# Patient Record
Sex: Female | Born: 1960 | Race: Black or African American | Hispanic: No | Marital: Married | Smoking: Current every day smoker
Health system: Southern US, Community
[De-identification: ages and names within clinical notes are randomized; demographics above are authoritative.]

## PROBLEM LIST (undated history)

## (undated) DIAGNOSIS — C50919 Malignant neoplasm of unspecified site of unspecified female breast: Secondary | ICD-10-CM

## (undated) DIAGNOSIS — I272 Pulmonary hypertension, unspecified: Secondary | ICD-10-CM

## (undated) DIAGNOSIS — K219 Gastro-esophageal reflux disease without esophagitis: Secondary | ICD-10-CM

## (undated) DIAGNOSIS — I10 Essential (primary) hypertension: Secondary | ICD-10-CM

## (undated) DIAGNOSIS — Z923 Personal history of irradiation: Secondary | ICD-10-CM

## (undated) DIAGNOSIS — I509 Heart failure, unspecified: Secondary | ICD-10-CM

## (undated) DIAGNOSIS — R0600 Dyspnea, unspecified: Secondary | ICD-10-CM

## (undated) DIAGNOSIS — I50811 Acute right heart failure: Secondary | ICD-10-CM

## (undated) DIAGNOSIS — E8770 Fluid overload, unspecified: Secondary | ICD-10-CM

## (undated) DIAGNOSIS — M7989 Other specified soft tissue disorders: Secondary | ICD-10-CM

## (undated) DIAGNOSIS — R1900 Intra-abdominal and pelvic swelling, mass and lump, unspecified site: Secondary | ICD-10-CM

## (undated) DIAGNOSIS — Z720 Tobacco use: Secondary | ICD-10-CM

## (undated) DIAGNOSIS — J449 Chronic obstructive pulmonary disease, unspecified: Secondary | ICD-10-CM

## (undated) DIAGNOSIS — I50810 Right heart failure, unspecified: Secondary | ICD-10-CM

## (undated) DIAGNOSIS — R600 Localized edema: Secondary | ICD-10-CM

## (undated) DIAGNOSIS — I5033 Acute on chronic diastolic (congestive) heart failure: Secondary | ICD-10-CM

## (undated) HISTORY — PX: BREAST LUMPECTOMY: SHX2

## (undated) HISTORY — PX: BREAST BIOPSY: SHX20

## (undated) HISTORY — DX: Dyspnea, unspecified: R06.00

## (undated) HISTORY — DX: Intra-abdominal and pelvic swelling, mass and lump, unspecified site: R19.00

## (undated) HISTORY — DX: Acute on chronic diastolic (congestive) heart failure: I50.33

## (undated) HISTORY — DX: Right heart failure, unspecified: I50.810

## (undated) HISTORY — DX: Fluid overload, unspecified: E87.70

## (undated) HISTORY — DX: Pulmonary hypertension, unspecified: I27.20

## (undated) HISTORY — DX: Tobacco use: Z72.0

## (undated) HISTORY — PX: TUBAL LIGATION: SHX77

## (undated) HISTORY — DX: Chronic obstructive pulmonary disease, unspecified: J44.9

## (undated) HISTORY — DX: Acute right heart failure: I50.811

## (undated) HISTORY — DX: Localized edema: R60.0

## (undated) HISTORY — DX: Gastro-esophageal reflux disease without esophagitis: K21.9

## (undated) HISTORY — DX: Heart failure, unspecified: I50.9

---

## 1998-10-20 ENCOUNTER — Encounter: Admission: RE | Admit: 1998-10-20 | Discharge: 1998-10-20 | Payer: Self-pay | Admitting: Family Medicine

## 1998-10-31 ENCOUNTER — Encounter: Admission: RE | Admit: 1998-10-31 | Discharge: 1998-10-31 | Payer: Self-pay | Admitting: Family Medicine

## 2000-09-23 ENCOUNTER — Encounter: Admission: RE | Admit: 2000-09-23 | Discharge: 2000-09-23 | Payer: Self-pay | Admitting: Family Medicine

## 2000-09-23 ENCOUNTER — Encounter: Payer: Self-pay | Admitting: Family Medicine

## 2000-09-23 LAB — CONVERTED CEMR LAB
HCT: 26 %
Hemoglobin: 7.6 g/dL
MCV: 60.8 fL
Pap Smear: NORMAL
WBC: 6.1 10*3/uL

## 2000-09-27 ENCOUNTER — Encounter: Admission: RE | Admit: 2000-09-27 | Discharge: 2000-09-27 | Payer: Self-pay | Admitting: Family Medicine

## 2000-10-07 ENCOUNTER — Encounter: Payer: Self-pay | Admitting: Sports Medicine

## 2000-10-07 ENCOUNTER — Encounter: Admission: RE | Admit: 2000-10-07 | Discharge: 2000-10-07 | Payer: Self-pay | Admitting: Sports Medicine

## 2005-01-17 ENCOUNTER — Encounter (INDEPENDENT_AMBULATORY_CARE_PROVIDER_SITE_OTHER): Payer: Self-pay | Admitting: *Deleted

## 2005-01-17 LAB — CONVERTED CEMR LAB

## 2005-01-18 ENCOUNTER — Ambulatory Visit: Payer: Self-pay | Admitting: Family Medicine

## 2005-01-18 ENCOUNTER — Encounter: Payer: Self-pay | Admitting: Family Medicine

## 2005-01-18 LAB — CONVERTED CEMR LAB
ALT: <8 units/L
AST: 13 units/L
Basophils Absolute: 0.2 10*3/uL
Basophils Relative: 3 %
CO2: 23 meq/L
Chloride: 106 meq/L
Creatinine, Ser: 0.9 mg/dL
Hemoglobin: 4.7 g/dL
Lymphocytes Relative: 32 %
MCHC: 24.4 g/dL
Monocytes Absolute: 0.5 10*3/uL
Neutro Abs: 3.5 10*3/uL
Platelets: 282 10*3/uL
RDW: 19.5 %
Sodium: 140 meq/L
Total Bilirubin: 0.9 mg/dL
Total Protein: 7 g/dL

## 2005-01-22 ENCOUNTER — Encounter: Admission: RE | Admit: 2005-01-22 | Discharge: 2005-01-22 | Payer: Self-pay | Admitting: Family Medicine

## 2005-01-26 ENCOUNTER — Ambulatory Visit: Payer: Self-pay | Admitting: Family Medicine

## 2005-01-26 ENCOUNTER — Encounter: Payer: Self-pay | Admitting: Family Medicine

## 2005-01-26 LAB — CONVERTED CEMR LAB
Ferritin: 2 ng/mL
HCT: 22.1 %
MCHC: 24.4 g/dL
MCV: 63.3 fL
Platelets: 234 10*3/uL
RDW: 23.6 %

## 2005-02-02 ENCOUNTER — Ambulatory Visit: Payer: Self-pay | Admitting: Family Medicine

## 2005-02-05 ENCOUNTER — Ambulatory Visit: Payer: Self-pay | Admitting: Family Medicine

## 2005-02-05 LAB — CONVERTED CEMR LAB
Cholesterol: 144 mg/dL
HDL: 52 mg/dL
Hemoglobin: 6.6 g/dL
MCHC: 23.4 g/dL
Platelets: 500 10*3/uL
RDW: 28.8 %
Triglycerides: 90 mg/dL

## 2006-05-16 DIAGNOSIS — Z720 Tobacco use: Secondary | ICD-10-CM

## 2006-05-16 DIAGNOSIS — K219 Gastro-esophageal reflux disease without esophagitis: Secondary | ICD-10-CM | POA: Insufficient documentation

## 2006-05-16 HISTORY — DX: Tobacco use: Z72.0

## 2006-05-16 HISTORY — DX: Gastro-esophageal reflux disease without esophagitis: K21.9

## 2006-05-17 ENCOUNTER — Encounter (INDEPENDENT_AMBULATORY_CARE_PROVIDER_SITE_OTHER): Payer: Self-pay | Admitting: *Deleted

## 2008-07-05 ENCOUNTER — Encounter: Payer: Self-pay | Admitting: Family Medicine

## 2008-07-05 ENCOUNTER — Ambulatory Visit: Payer: Self-pay | Admitting: Family Medicine

## 2008-07-05 DIAGNOSIS — R1900 Intra-abdominal and pelvic swelling, mass and lump, unspecified site: Secondary | ICD-10-CM

## 2008-07-05 HISTORY — DX: Intra-abdominal and pelvic swelling, mass and lump, unspecified site: R19.00

## 2008-07-05 LAB — CONVERTED CEMR LAB
ALT: <8 units/L
AST: 23 units/L
Albumin: 4.3 g/dL
Alkaline Phosphatase: 53 units/L
Chlamydia, DNA Probe: NEGATIVE
GC Probe Amp, Genital: NEGATIVE
Glucose, Bld: 86 mg/dL
HCT: 42.9 % (ref 36.0–46.0)
Hemoglobin: 13.9 g/dL (ref 12.0–15.0)
Potassium: 4 meq/L
RBC: 5.01 M/uL (ref 3.87–5.11)
Sodium: 140 meq/L
Total Bilirubin: 0.4 mg/dL
Total Protein: 7 g/dL
WBC: 6.7 10*3/uL (ref 4.0–10.5)

## 2008-07-06 ENCOUNTER — Encounter: Admission: RE | Admit: 2008-07-06 | Discharge: 2008-07-06 | Payer: Self-pay | Admitting: Family Medicine

## 2008-07-06 ENCOUNTER — Encounter: Payer: Self-pay | Admitting: Family Medicine

## 2008-07-07 ENCOUNTER — Encounter: Payer: Self-pay | Admitting: Family Medicine

## 2008-08-13 ENCOUNTER — Encounter: Payer: Self-pay | Admitting: Family Medicine

## 2008-08-26 ENCOUNTER — Encounter: Payer: Self-pay | Admitting: Family Medicine

## 2009-08-12 ENCOUNTER — Emergency Department (HOSPITAL_COMMUNITY): Admission: EM | Admit: 2009-08-12 | Discharge: 2009-08-12 | Payer: Self-pay | Admitting: Emergency Medicine

## 2010-04-24 ENCOUNTER — Encounter: Payer: Self-pay | Admitting: *Deleted

## 2014-01-12 ENCOUNTER — Emergency Department (HOSPITAL_COMMUNITY): Payer: Self-pay

## 2014-01-12 ENCOUNTER — Emergency Department (HOSPITAL_COMMUNITY)
Admission: EM | Admit: 2014-01-12 | Discharge: 2014-01-13 | Disposition: A | Discharge status: Home / Self Care | Payer: Self-pay | Attending: Emergency Medicine | Admitting: Emergency Medicine

## 2014-01-12 ENCOUNTER — Encounter (HOSPITAL_COMMUNITY): Payer: Self-pay | Admitting: Emergency Medicine

## 2014-01-12 DIAGNOSIS — R05 Cough: Secondary | ICD-10-CM | POA: Insufficient documentation

## 2014-01-12 DIAGNOSIS — Z79899 Other long term (current) drug therapy: Secondary | ICD-10-CM | POA: Insufficient documentation

## 2014-01-12 DIAGNOSIS — Z720 Tobacco use: Secondary | ICD-10-CM | POA: Insufficient documentation

## 2014-01-12 DIAGNOSIS — K047 Periapical abscess without sinus: Secondary | ICD-10-CM | POA: Insufficient documentation

## 2014-01-12 DIAGNOSIS — R0602 Shortness of breath: Secondary | ICD-10-CM

## 2014-01-12 DIAGNOSIS — R0600 Dyspnea, unspecified: Secondary | ICD-10-CM | POA: Insufficient documentation

## 2014-01-12 DIAGNOSIS — K029 Dental caries, unspecified: Secondary | ICD-10-CM | POA: Insufficient documentation

## 2014-01-12 DIAGNOSIS — R0902 Hypoxemia: Secondary | ICD-10-CM | POA: Insufficient documentation

## 2014-01-12 DIAGNOSIS — R0981 Nasal congestion: Secondary | ICD-10-CM | POA: Insufficient documentation

## 2014-01-12 LAB — BASIC METABOLIC PANEL
ANION GAP: 12 (ref 5–15)
BUN: 7 mg/dL (ref 6–23)
CALCIUM: 9.5 mg/dL (ref 8.4–10.5)
CO2: 33 meq/L — AB (ref 19–32)
Chloride: 94 mEq/L — ABNORMAL LOW (ref 96–112)
Creatinine, Ser: 0.67 mg/dL (ref 0.50–1.10)
Glucose, Bld: 106 mg/dL — ABNORMAL HIGH (ref 70–99)
Potassium: 3.4 mEq/L — ABNORMAL LOW (ref 3.7–5.3)
SODIUM: 139 meq/L (ref 137–147)

## 2014-01-12 LAB — CBC
HCT: 45.8 % (ref 36.0–46.0)
HEMOGLOBIN: 14.5 g/dL (ref 12.0–15.0)
MCH: 27.8 pg (ref 26.0–34.0)
MCHC: 31.7 g/dL (ref 30.0–36.0)
MCV: 87.9 fL (ref 78.0–100.0)
Platelets: 258 10*3/uL (ref 150–400)
RBC: 5.21 MIL/uL — AB (ref 3.87–5.11)
RDW: 13.2 % (ref 11.5–15.5)
WBC: 12.1 10*3/uL — AB (ref 4.0–10.5)

## 2014-01-12 LAB — I-STAT TROPONIN, ED: TROPONIN I, POC: 0 ng/mL (ref 0.00–0.08)

## 2014-01-12 LAB — TROPONIN I

## 2014-01-12 MED ORDER — PREDNISONE 20 MG PO TABS
60.0000 mg | ORAL_TABLET | Freq: Once | ORAL | Status: AC
  Administered 2014-01-13: 60 mg via ORAL
  Filled 2014-01-12: qty 3

## 2014-01-12 MED ORDER — AMOXICILLIN-POT CLAVULANATE 875-125 MG PO TABS: 1.0000 | ORAL_TABLET | Freq: Two times a day (BID) | ORAL | Status: DC

## 2014-01-12 MED ORDER — ALBUTEROL SULFATE HFA 108 (90 BASE) MCG/ACT IN AERS
2.0000 | INHALATION_SPRAY | Freq: Four times a day (QID) | RESPIRATORY_TRACT | Status: DC
  Administered 2014-01-13: 2 via RESPIRATORY_TRACT
  Filled 2014-01-12: qty 6.7

## 2014-01-12 MED ORDER — AMOXICILLIN-POT CLAVULANATE 875-125 MG PO TABS
1.0000 | ORAL_TABLET | Freq: Once | ORAL | Status: AC
  Administered 2014-01-12: 1 via ORAL
  Filled 2014-01-12: qty 1

## 2014-01-12 MED ORDER — PREDNISONE 20 MG PO TABS: 60.0000 mg | ORAL_TABLET | Freq: Once | ORAL | Status: AC

## 2014-01-12 MED ORDER — IOHEXOL 350 MG/ML SOLN
100.0000 mL | Freq: Once | INTRAVENOUS | Status: AC | PRN
  Administered 2014-01-12: 100 mL via INTRAVENOUS

## 2014-01-12 NOTE — ED Notes (Signed)
Bed: WA06 Expected date:  Expected time:  Means of arrival:  Comments: No monitor

## 2014-01-12 NOTE — ED Notes (Addendum)
Pt reports chest pain x 1 hour, sob and cough for several weeks. Pt reports upper right dental pain since Thursday with facial pain/swelling

## 2014-01-12 NOTE — ED Provider Notes (Signed)
CSN: [id number]     Arrival date & time 01/12/14  1716 History   First MD Initiated Contact with Patient 01/12/14 2037     Chief Complaint  Patient presents with  . Chest Pain      HPI  Patient presents with chest pain and facial swelling. Patient notes that over the past month she has had cough, dyspnea, without fever, chills, nausea, vomiting. Over the past 2 days patient has also developed right facial swelling, with no new dysphagia, dysphasia, visual loss or headache. Today, the report prior to ED arrival the patient's substernal chest discomfort. This pain has eased prior to my evaluation, spontaneously. The pain was sternal, nonradiating, sore. It is not pleuritic or exertional. Patient has not seen her physician in at least 5 years. Patient smokes.   Patient received smoking cessation counseling.     History reviewed. No pertinent past medical history. History reviewed. No pertinent past surgical history. No family history on file. History  Substance Use Topics  . Smoking status: Current Every Day Smoker  . Smokeless tobacco: Not on file  . Alcohol Use: Yes   OB History   Grav Para Term Preterm Abortions TAB SAB Ect Mult Living                 Review of Systems  Constitutional:       Per HPI, otherwise negative  HENT:       Per HPI, otherwise negative  Respiratory:       Per HPI, otherwise negative  Cardiovascular:       Per HPI, otherwise negative  Gastrointestinal: Negative for vomiting.  Endocrine:       Negative aside from HPI  Genitourinary:       Neg aside from HPI   Musculoskeletal:       Per HPI, otherwise negative  Skin: Negative.   Neurological: Negative for syncope.      Allergies  Review of patient's allergies indicates not on file.  Home Medications   Prior to Admission medications   Medication Sig Start Date End Date Taking? Authorizing Provider  esomeprazole (NEXIUM) 20 MG capsule Take 20 mg by mouth daily. one hour before  largest meal of day     Historical Provider, MD   BP 168/96  Pulse 84  Temp(Src) 100.2 F (37.9 C) (Oral)  Resp 20  Ht 5' 6" (1.676 m)  Wt 189 lb (85.73 kg)  BMI 30.52 kg/m2  SpO2 98% Physical Exam  Nursing note and vitals reviewed. Constitutional: She is oriented to person, place, and time. She appears well-developed and well-nourished. No distress.  HENT:  Head: Normocephalic and atraumatic.  Right facial swelling, no changes about the orbit. The right upper posterior teeth are all decayed, with surrounding erythema, no drainage, no bleeding.  Eyes: Conjunctivae and EOM are normal.  Cardiovascular: Normal rate and regular rhythm.   Pulmonary/Chest: Effort normal and breath sounds normal. No stridor. No respiratory distress.  Abdominal: She exhibits no distension.  Musculoskeletal: She exhibits no edema.  Neurological: She is alert and oriented to person, place, and time. No cranial nerve deficit.  Skin: Skin is warm and dry.  Psychiatric: She has a normal mood and affect.    ED Course  Procedures (including critical care time) Labs Review Labs Reviewed  BASIC METABOLIC PANEL - Abnormal; Notable for the following:    Potassium 3.4 (*)    Chloride 94 (*)    CO2 33 (*)    Glucose, Bld 106 (*)  All other components within normal limits  CBC - Abnormal; Notable for the following:    WBC 12.1 (*)    RBC 5.21 (*)    All other components within normal limits  I-STAT TROPOININ, ED    Imaging Review Dg Chest 2 View  01/12/2014   CLINICAL DATA:  Cough and shortness breath for 2 weeks. Mid chest pain. History of smoking.  EXAM: CHEST  2 VIEW  COMPARISON:  None.  FINDINGS: Two views of the chest demonstrate prominent right hilar structures. There may be linear atelectasis in the right mid lung. Azygos shadow is slightly prominent. There is no focal airspace disease or pleural effusions. Trachea is midline. Heart size is within normal limits.  IMPRESSION: Prominent hilar and  central vascular structures, particularly on the right side. Findings could represent vascular congestion but difficult to exclude lymphadenopathy. Consider further evaluation with a post contrast chest CT.  Linear densities in the right mid chest suggest atelectasis. No focal airspace disease.   Electronically Signed   By: Markus Daft M.D.   On: 01/12/2014 18:24     EKG Interpretation   Date/Time:  Tuesday January 12 2014 17:32:04 EDT Ventricular Rate:  92 PR Interval:  141 QRS Duration: 86 QT Interval:  473 QTC Calculation: 585 R Axis:   80 Text Interpretation:  Sinus rhythm Nonspecific T abnormalities, diffuse  leads Prolonged QT interval agree. No STEMI. ABNORMAL Confirmed by  Johnney Killian, MD, Jeannie Done [phone]) on 01/12/2014 6:05:02 PM     On re-exam the patient is in no distress MDM   Final diagnoses:  Hypoxia  dental infection   Patient presents with one month of cough, congestion, mild chest pain. Patient also has facial swelling, which seems unrelated. Patient has no evidence for respiratory compromise, sepsis, SIRS. Patient's respiratory computer likely due to bronchitis, given that she is a smoker. Without evidence for pneumonia, or concerning findings, she is discharged in stable condition with bronchodilator, steroids       Carmin Muskrat, MD 01/12/14 2319

## 2014-01-12 NOTE — Discharge Instructions (Signed)
As discussed, your chest pain and cough is likely due to bronchitis.  The single best thing you can do for this condition is to stop smoking. Please take all medication as directed, and be sure to follow-up with regular care.  Please use the provided inhaler every four hours for the next two days.  In addition, your facial swelling is due to a dental infection.  It is important that she take her antibiotics, and follow-up with a dentist for definitive care.  Return here for concern changes in your condition.

## 2015-08-08 ENCOUNTER — Emergency Department (HOSPITAL_COMMUNITY): Payer: Self-pay

## 2015-08-08 ENCOUNTER — Encounter (HOSPITAL_COMMUNITY): Payer: Self-pay | Admitting: Emergency Medicine

## 2015-08-08 ENCOUNTER — Inpatient Hospital Stay (HOSPITAL_COMMUNITY)
Admission: EM | Admit: 2015-08-08 | Discharge: 2015-08-13 | DRG: 292 | Disposition: A | Discharge status: Home / Self Care | Payer: Self-pay | Attending: Family Medicine | Admitting: Family Medicine

## 2015-08-08 DIAGNOSIS — R600 Localized edema: Secondary | ICD-10-CM | POA: Insufficient documentation

## 2015-08-08 DIAGNOSIS — G4733 Obstructive sleep apnea (adult) (pediatric): Secondary | ICD-10-CM | POA: Diagnosis present

## 2015-08-08 DIAGNOSIS — I509 Heart failure, unspecified: Secondary | ICD-10-CM

## 2015-08-08 DIAGNOSIS — I5033 Acute on chronic diastolic (congestive) heart failure: Secondary | ICD-10-CM | POA: Diagnosis present

## 2015-08-08 DIAGNOSIS — F17200 Nicotine dependence, unspecified, uncomplicated: Secondary | ICD-10-CM | POA: Diagnosis present

## 2015-08-08 DIAGNOSIS — E874 Mixed disorder of acid-base balance: Secondary | ICD-10-CM | POA: Diagnosis present

## 2015-08-08 DIAGNOSIS — Z23 Encounter for immunization: Secondary | ICD-10-CM

## 2015-08-08 DIAGNOSIS — I272 Other secondary pulmonary hypertension: Secondary | ICD-10-CM | POA: Diagnosis present

## 2015-08-08 DIAGNOSIS — I5031 Acute diastolic (congestive) heart failure: Secondary | ICD-10-CM | POA: Diagnosis present

## 2015-08-08 DIAGNOSIS — E8770 Fluid overload, unspecified: Secondary | ICD-10-CM | POA: Insufficient documentation

## 2015-08-08 DIAGNOSIS — E785 Hyperlipidemia, unspecified: Secondary | ICD-10-CM | POA: Diagnosis present

## 2015-08-08 DIAGNOSIS — D509 Iron deficiency anemia, unspecified: Secondary | ICD-10-CM | POA: Diagnosis present

## 2015-08-08 DIAGNOSIS — J449 Chronic obstructive pulmonary disease, unspecified: Secondary | ICD-10-CM | POA: Diagnosis present

## 2015-08-08 DIAGNOSIS — I50810 Right heart failure, unspecified: Secondary | ICD-10-CM

## 2015-08-08 DIAGNOSIS — R7303 Prediabetes: Secondary | ICD-10-CM | POA: Diagnosis present

## 2015-08-08 DIAGNOSIS — R0902 Hypoxemia: Secondary | ICD-10-CM | POA: Insufficient documentation

## 2015-08-08 DIAGNOSIS — I110 Hypertensive heart disease with heart failure: Principal | ICD-10-CM | POA: Diagnosis present

## 2015-08-08 DIAGNOSIS — I2781 Cor pulmonale (chronic): Secondary | ICD-10-CM | POA: Diagnosis present

## 2015-08-08 DIAGNOSIS — K219 Gastro-esophageal reflux disease without esophagitis: Secondary | ICD-10-CM | POA: Diagnosis present

## 2015-08-08 DIAGNOSIS — R0600 Dyspnea, unspecified: Secondary | ICD-10-CM | POA: Diagnosis present

## 2015-08-08 DIAGNOSIS — Z7982 Long term (current) use of aspirin: Secondary | ICD-10-CM

## 2015-08-08 DIAGNOSIS — I50811 Acute right heart failure: Secondary | ICD-10-CM

## 2015-08-08 DIAGNOSIS — E872 Acidosis: Secondary | ICD-10-CM | POA: Diagnosis present

## 2015-08-08 DIAGNOSIS — I10 Essential (primary) hypertension: Secondary | ICD-10-CM | POA: Insufficient documentation

## 2015-08-08 HISTORY — DX: Essential (primary) hypertension: I10

## 2015-08-08 HISTORY — DX: Gastro-esophageal reflux disease without esophagitis: K21.9

## 2015-08-08 HISTORY — DX: Other specified soft tissue disorders: M79.89

## 2015-08-08 LAB — CBC WITH DIFFERENTIAL/PLATELET
BASOS ABS: 0.1 10*3/uL (ref 0.0–0.1)
BASOS PCT: 1 %
Eosinophils Absolute: 0.1 10*3/uL (ref 0.0–0.7)
Eosinophils Relative: 1 %
HEMATOCRIT: 47.7 % — AB (ref 36.0–46.0)
Hemoglobin: 14.2 g/dL (ref 12.0–15.0)
Lymphocytes Relative: 34 %
Lymphs Abs: 1.8 10*3/uL (ref 0.7–4.0)
MCH: 25.7 pg — ABNORMAL LOW (ref 26.0–34.0)
MCHC: 29.8 g/dL — ABNORMAL LOW (ref 30.0–36.0)
MCV: 86.3 fL (ref 78.0–100.0)
MONO ABS: 0.5 10*3/uL (ref 0.1–1.0)
Monocytes Relative: 10 %
NEUTROS ABS: 2.9 10*3/uL (ref 1.7–7.7)
Neutrophils Relative %: 54 %
PLATELETS: 237 10*3/uL (ref 150–400)
RBC: 5.53 MIL/uL — AB (ref 3.87–5.11)
RDW: 13.9 % (ref 11.5–15.5)
WBC: 5.4 10*3/uL (ref 4.0–10.5)

## 2015-08-08 LAB — TROPONIN I
TROPONIN I: 0.03 ng/mL (ref <0.031)
Troponin I: 0.03 ng/mL (ref <0.031)

## 2015-08-08 LAB — LIPID PANEL
CHOL/HDL RATIO: 3.8 ratio
Cholesterol: 174 mg/dL (ref 0–200)
HDL: 46 mg/dL (ref >40)
LDL Cholesterol: 115 mg/dL — ABNORMAL HIGH (ref 0–99)
TRIGLYCERIDES: 64 mg/dL (ref <150)
VLDL: 13 mg/dL (ref 0–40)

## 2015-08-08 LAB — COMPREHENSIVE METABOLIC PANEL
ALBUMIN: 3.3 g/dL — AB (ref 3.5–5.0)
ALK PHOS: 68 U/L (ref 38–126)
ALT: 27 U/L (ref 14–54)
ANION GAP: 5 (ref 5–15)
AST: 30 U/L (ref 15–41)
BUN: 5 mg/dL — ABNORMAL LOW (ref 6–20)
CALCIUM: 9.2 mg/dL (ref 8.9–10.3)
CO2: 35 mmol/L — ABNORMAL HIGH (ref 22–32)
Chloride: 102 mmol/L (ref 101–111)
Creatinine, Ser: 0.79 mg/dL (ref 0.44–1.00)
GFR calc Af Amer: >60 mL/min (ref >60)
GLUCOSE: 90 mg/dL (ref 65–99)
POTASSIUM: 3.7 mmol/L (ref 3.5–5.1)
Sodium: 142 mmol/L (ref 135–145)
TOTAL PROTEIN: 5.5 g/dL — AB (ref 6.5–8.1)
Total Bilirubin: 0.7 mg/dL (ref 0.3–1.2)

## 2015-08-08 LAB — PROTIME-INR
INR: 1.1 (ref 0.00–1.49)
PROTHROMBIN TIME: 14.4 s (ref 11.6–15.2)

## 2015-08-08 LAB — TSH: TSH: 1.27 u[IU]/mL (ref 0.350–4.500)

## 2015-08-08 LAB — BRAIN NATRIURETIC PEPTIDE: B Natriuretic Peptide: 548 pg/mL — ABNORMAL HIGH (ref 0.0–100.0)

## 2015-08-08 LAB — MAGNESIUM: Magnesium: 1.8 mg/dL (ref 1.7–2.4)

## 2015-08-08 MED ORDER — ONDANSETRON HCL 4 MG/2ML IJ SOLN: 4.0000 mg | Freq: Four times a day (QID) | INTRAMUSCULAR | Status: DC | PRN

## 2015-08-08 MED ORDER — SODIUM CHLORIDE 0.9% FLUSH
3.0000 mL | Freq: Two times a day (BID) | INTRAVENOUS | Status: DC
  Administered 2015-08-08 – 2015-08-13 (×9): 3 mL via INTRAVENOUS

## 2015-08-08 MED ORDER — ASPIRIN EC 81 MG PO TBEC
81.0000 mg | DELAYED_RELEASE_TABLET | Freq: Every day | ORAL | Status: DC
  Administered 2015-08-08 – 2015-08-13 (×6): 81 mg via ORAL
  Filled 2015-08-08 (×7): qty 1

## 2015-08-08 MED ORDER — FUROSEMIDE 10 MG/ML IJ SOLN
60.0000 mg | Freq: Two times a day (BID) | INTRAMUSCULAR | Status: DC
  Administered 2015-08-09: 60 mg via INTRAVENOUS
  Filled 2015-08-08: qty 6

## 2015-08-08 MED ORDER — FUROSEMIDE 10 MG/ML IJ SOLN
40.0000 mg | Freq: Once | INTRAMUSCULAR | Status: AC
  Administered 2015-08-08: 40 mg via INTRAVENOUS
  Filled 2015-08-08: qty 4

## 2015-08-08 MED ORDER — ENOXAPARIN SODIUM 40 MG/0.4ML ~~LOC~~ SOLN
40.0000 mg | SUBCUTANEOUS | Status: DC
  Administered 2015-08-08 – 2015-08-11 (×4): 40 mg via SUBCUTANEOUS
  Filled 2015-08-08 (×4): qty 0.4

## 2015-08-08 MED ORDER — LABETALOL HCL 5 MG/ML IV SOLN: 10.0000 mg | INTRAVENOUS | Status: DC | PRN

## 2015-08-08 MED ORDER — SODIUM CHLORIDE 0.9% FLUSH
3.0000 mL | INTRAVENOUS | Status: DC | PRN
Start: 2015-08-08 — End: 2015-08-13

## 2015-08-08 MED ORDER — PNEUMOCOCCAL VAC POLYVALENT 25 MCG/0.5ML IJ INJ
0.5000 mL | INJECTION | INTRAMUSCULAR | Status: AC
  Administered 2015-08-09: 0.5 mL via INTRAMUSCULAR
  Filled 2015-08-08: qty 0.5

## 2015-08-08 MED ORDER — PANTOPRAZOLE SODIUM 20 MG PO TBEC
20.0000 mg | DELAYED_RELEASE_TABLET | Freq: Every day | ORAL | Status: DC
  Administered 2015-08-09 – 2015-08-13 (×5): 20 mg via ORAL
  Filled 2015-08-08 (×5): qty 1

## 2015-08-08 MED ORDER — SODIUM CHLORIDE 0.9 % IV SOLN: 250.0000 mL | INTRAVENOUS | Status: DC | PRN

## 2015-08-08 MED ORDER — ACETAMINOPHEN 325 MG PO TABS: 650.0000 mg | ORAL_TABLET | ORAL | Status: DC | PRN

## 2015-08-08 NOTE — ED Notes (Signed)
P has had bilateral leg swelling x 1 month. Pt alert x4. NAD at this time.

## 2015-08-08 NOTE — Progress Notes (Signed)
1850 transferred in from ED via bed .Fully awake alert  and oriented kept comfortable in bed  Safety precaution observed. Report to night shift RN  Given CMT notified and verified

## 2015-08-08 NOTE — ED Notes (Signed)
Attempted report x 3.

## 2015-08-08 NOTE — ED Notes (Signed)
Attempted report 

## 2015-08-08 NOTE — ED Provider Notes (Signed)
CSN: [id number]     Arrival date & time 08/08/15  1022 History   First MD Initiated Contact with Patient 08/08/15 1216     Chief Complaint  Patient presents with  . Leg Swelling     (Consider location/radiation/quality/duration/timing/severity/associated sxs/prior Treatment) HPI  55 year old female presents with bilateral leg swelling for about one month. Swelling goes up and down. Noting that helps the swelling is whenever she props her feet up at night. Walking and being up during the day makes the swelling worse. Swelling is symmetrical. She has noticed some redness whenever her legs get swollen. They have also had tingling and pain. She has also noted shortness of breath ever since she went back to work. It is hard to tell when tugged patient exactly when she is short of breath but it mostly seems to be after significant exertion such as walking to the bus stop her walking at work. No current shortness of breath. No chest pain at all. No orthopnea. She feels like her abdomen has been swelling as well.  History reviewed. No pertinent past medical history. History reviewed. No pertinent past surgical history. No family history on file. Social History  Substance Use Topics  . Smoking status: Current Every Day Smoker  . Smokeless tobacco: None  . Alcohol Use: Yes   OB History    No data available     Review of Systems  Respiratory: Positive for shortness of breath.   Cardiovascular: Positive for leg swelling. Negative for chest pain.  Gastrointestinal: Positive for abdominal distention. Negative for abdominal pain.  All other systems reviewed and are negative.     Allergies  Review of patient's allergies indicates no known allergies.  Home Medications   Prior to Admission medications   Medication Sig Start Date End Date Taking? Authorizing Provider  amoxicillin-clavulanate (AUGMENTIN) 875-125 MG per tablet Take 1 tablet by mouth 2 (two) times daily. Patient not taking:  Reported on 08/08/2015 01/12/14   Carmin Muskrat, MD  ibuprofen (ADVIL,MOTRIN) 200 MG tablet Take 400 mg by mouth every 4 (four) hours as needed for moderate pain (mouth pain).    Historical Provider, MD   BP 161/121 mmHg  Pulse 72  Temp(Src) 98.6 F (37 C) (Oral)  Resp 14  SpO2 97% Physical Exam  Constitutional: She is oriented to person, place, and time. She appears well-developed and well-nourished.  HENT:  Head: Normocephalic and atraumatic.  Right Ear: External ear normal.  Left Ear: External ear normal.  Nose: Nose normal.  Eyes: Right eye exhibits no discharge. Left eye exhibits no discharge.  Cardiovascular: Normal rate, regular rhythm and normal heart sounds.   Pulmonary/Chest: Effort normal. She has decreased breath sounds in the right lower field and the left lower field.  Abdominal: Soft. She exhibits no distension. There is no tenderness.  No obvious ascites  Musculoskeletal: She exhibits edema (bilateral lower extremity edema up to knees with shiny appearance and mild diffuse erythema).  Neurological: She is alert and oriented to person, place, and time.  Skin: Skin is warm and dry.  Nursing note and vitals reviewed.   ED Course  Procedures (including critical care time) Labs Review Labs Reviewed  COMPREHENSIVE METABOLIC PANEL - Abnormal; Notable for the following:    CO2 35 (*)    BUN 5 (*)    Total Protein 5.5 (*)    Albumin 3.3 (*)    All other components within normal limits  CBC WITH DIFFERENTIAL/PLATELET - Abnormal; Notable for the following:  RBC 5.53 (*)    HCT 47.7 (*)    MCH 25.7 (*)    MCHC 29.8 (*)    All other components within normal limits  BRAIN NATRIURETIC PEPTIDE - Abnormal; Notable for the following:    B Natriuretic Peptide 548.0 (*)    All other components within normal limits  TROPONIN I    Imaging Review Dg Chest 2 View  08/08/2015  CLINICAL DATA:  Bilateral leg swelling EXAM: CHEST  2 VIEW COMPARISON:  01/12/2014 FINDINGS:  Cardiac enlargement with mild vascular congestion suggesting fluid overload. Negative for edema or effusion. Negative for pneumonia Pulmonary hyperinflation. IMPRESSION: Pulmonary vascular congestion without edema or effusion COPD Electronically Signed   By: Franchot Gallo M.D.   On: 08/08/2015 13:13   I have personally reviewed and evaluated these images and lab results as part of my medical decision-making.   EKG Interpretation   Date/Time:  Monday Aug 08 2015 13:21:11 EDT Ventricular Rate:  75 PR Interval:  164 QRS Duration: 102 QT Interval:  428 QTC Calculation: 478 R Axis:   82 Text Interpretation:  Sinus rhythm Low voltage, precordial leads Abnormal  T, consider ischemia, diffuse leads no significant change since Oct 2015  Confirmed by Regenia Skeeter MD, Mahmud Keithly [phone]) on 08/08/2015 4:39:38 PM      MDM   Final diagnoses:  Acute congestive heart failure, unspecified congestive heart failure type [facility])    Patient with new-onset fluid overload and some early pulmonary edema. On room air her oxygen drops just below 90% at rest. Given this with rails on exam and significant fluid overload, will admit for IV diuresis and workup of likely new-onset CHF. Patient is currently stable and does not have any chest pain. She does not have a primary care physician, will be admitted to family practice.    Sherwood Gambler, MD 08/08/15 [phone]

## 2015-08-08 NOTE — H&P (Signed)
[facility] Admission History and Physical Service Pager: [phone]  Patient name: Joy [name] record number: [id number] Date of birth: 04-25-1960 Age: 55 y.o. Gender: female  Primary Care Provider: No PCP Per Patient Consultants: None Code Status: DNI  Chief Complaint: Lower extremity Swelling.   Assessment and Plan: Joy [name] is a 55 y.o. female presenting with Dyspnea and Lower Extremity Swelling . PMH is significant for GERD, Anemia Iron Deficiency in the past.   # Dyspnea / LE Edema - Symptoms / Exam consistent with likely CHF diagnosis. Preserved Vs. Reduced ejection fraction remains to be differentiated. No history of angina or acute decompensation. Symptoms ongoing for over a month. O2 requirement 2L in the ED, BNP 548, Troponin 0.03, EKG with left atrial enlargement, slight T wave changes in the lateral leads, inferior lead compared to previous. No ST elevation or depression. CXR with cardiomegaly, pulmonary vascular congestion / interstitial edema. Marked volume overload to exam.  - Admit to obs Dr. Nori Riis attending - IV Lasix 12m BID - TSH - Lipid panel - A1C - Echocardiogram - Trops overnight.  - Consider cards consult  - BB, Ace, Lasix etc. Pending further workup.  - ASA 856m- Strict I/O - Daily BMET  # HTN - likely due to undiagnosed HTN, Volume overload.  - Labetalol prn SBP > 160 - Await Echo for therapeutic choice.  - Diuresing.   # GERD  - protonix here  # Iron Deficiency Anemia - Hemoglobin 14.2 here. MCV 86.3 - CBC as needed.    FEN/GI: Heart Healthy Prophylaxis: Lovenox.   Disposition: Pending further workup.   History of Present Illness:  Joy [name] a [age].o. female presenting with here with one month of worsening lower extremity edema and shortness of breath on exertion. Additionally, she notes her abdomen getting larger as well. Denies chest pain, palpitations, arm pain, neck pain, jaw pain, or chest  pain with exertion. No nausea or diaphoresis. 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 She does not often take stairs, but does not necessarily avoid them. No Orthopnea, she does describe PND. She sleeps with 2 pillows due to GERD for some time prior to the onset of her current symptoms. She smokes 1ppd for 36 years. Drinks ETOH on the weekends and says she has lately trying to "cut back". She denies drinking more than "on the weekends". Her brother died with heart failure at age [age]her younger brother has had Colon cancer. Otherwise no family history of early cardiac death. She has not seen a doctor in many years. She takes no medications on a daily basis, has no allergies to any medications that she knows of, and has no surgical history.   Review Of Systems: Per HPI with the following additions: none.  Otherwise the remainder of the systems were negative.  Patient Active Problem List   Diagnosis Date Noted  . CHF (congestive heart failure) ([place]05/22/2017  . 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 ANEMIA, IRON DEFICIENCY, UNSPEC. 05/16/2006  . TOBACCO DEPENDENCE 05/16/2006  . GASTROESOPHAGEAL REFLUX, NO ESOPHAGITIS 05/16/2006    Past Medical History: History reviewed. No pertinent past medical history.  Past Surgical History: History reviewed. No pertinent past surgical history.  Social History: Social History  Substance Use Topics  . Smoking status: Current Every Day Smoker  . Smokeless tobacco:  None  . Alcohol Use: Yes   Additional social history: 1ppd Cigarettes, Drinks ETOH "on the weekends". She does not quantify.   Please also refer to relevant sections of EMR.  Family History: No family history on file. (Dad - shot and deceased, Mom - unknown pt. In foster care, Brother died with heart failure, Brother- colon cancer living. If not completed, MUST add something in)  Allergies and  Medications: No Known Allergies No current facility-administered medications on file prior to encounter.   Current Outpatient Prescriptions on File Prior to Encounter  Medication Sig Dispense Refill  . amoxicillin-clavulanate (AUGMENTIN) 875-125 MG per tablet Take 1 tablet by mouth 2 (two) times daily. (Patient not taking: Reported on 08/08/2015) 14 tablet 0  . ibuprofen (ADVIL,MOTRIN) 200 MG tablet Take 400 mg by mouth every 4 (four) hours as needed for moderate pain (mouth pain).      Objective: BP 164/114 mmHg  Pulse 69  Temp(Src) 98.6 F (37 C) (Oral)  Resp 22  SpO2 94% Exam: General: NAD, resting comfortably on the side of the bed.  Eyes: EOMI, PERRLA ENTM: Nares patent, O/P clear without erythema Neck: JVD to the ear, FROM, supple Cardiovascular: RRR, no MGR, normal S1/S2, 2+ distal pulses. JVD to the ear.  Respiratory: Slight crackles in BL bases, otherwise clear and good air movement.  Abdomen: Obese, distended, abdominal wall edema to the navel, S, Nontender, Unable to palpate liver / spleen due to habitus.  MSK: S, NT, ND, 3+ edema to the thigh Skin: no erythema of LE, no skin breakdown Neuro: Grossly in tact.  Psych: Somewhat flat affect, otherwise appropriate.   Labs and Imaging: CBC BMET   Recent Labs Lab 08/08/15 1234  WBC 5.4  HGB 14.2  HCT 47.7*  PLT 237    Recent Labs Lab 08/08/15 1234  NA 142  K 3.7  CL 102  CO2 35*  BUN 5*  CREATININE 0.79  GLUCOSE 90  CALCIUM 9.2     BNP 548 CXR - pulmonary vascular congestion.  EKG - LAE, Flattened and inverted T-waves diffusely. No significant change from previous.  Troponin - 0.03   [name], MD 08/08/2015, 4:56 PM PGY-2, [place] Intern pager: [phone], text pages welcome

## 2015-08-08 NOTE — ED Notes (Signed)
Attempted report x 2 

## 2015-08-09 ENCOUNTER — Observation Stay (HOSPITAL_COMMUNITY): Payer: Self-pay

## 2015-08-09 ENCOUNTER — Other Ambulatory Visit: Payer: Self-pay

## 2015-08-09 DIAGNOSIS — R0600 Dyspnea, unspecified: Secondary | ICD-10-CM

## 2015-08-09 DIAGNOSIS — I509 Heart failure, unspecified: Secondary | ICD-10-CM | POA: Diagnosis present

## 2015-08-09 LAB — BASIC METABOLIC PANEL
ANION GAP: 7 (ref 5–15)
BUN: 7 mg/dL (ref 6–20)
CO2: 37 mmol/L — ABNORMAL HIGH (ref 22–32)
Calcium: 8.8 mg/dL — ABNORMAL LOW (ref 8.9–10.3)
Chloride: 98 mmol/L — ABNORMAL LOW (ref 101–111)
Creatinine, Ser: 0.75 mg/dL (ref 0.44–1.00)
GFR calc Af Amer: >60 mL/min (ref >60)
GLUCOSE: 106 mg/dL — AB (ref 65–99)
POTASSIUM: 4 mmol/L (ref 3.5–5.1)
Sodium: 142 mmol/L (ref 135–145)

## 2015-08-09 LAB — HEMOGLOBIN A1C
HEMOGLOBIN A1C: 6.4 % — AB (ref 4.8–5.6)
Mean Plasma Glucose: 137 mg/dL

## 2015-08-09 LAB — ECHOCARDIOGRAM COMPLETE
Height: 66.5 in
Weight: 3374.4 oz

## 2015-08-09 LAB — TROPONIN I
Troponin I: 0.03 ng/mL (ref <0.031)
Troponin I: <0.03 ng/mL (ref <0.031)

## 2015-08-09 MED ORDER — FUROSEMIDE 10 MG/ML IJ SOLN
40.0000 mg | Freq: Two times a day (BID) | INTRAMUSCULAR | Status: DC
  Administered 2015-08-09 – 2015-08-12 (×7): 40 mg via INTRAVENOUS
  Filled 2015-08-09 (×8): qty 4

## 2015-08-09 NOTE — Progress Notes (Signed)
Heart Failure Navigator Consult Note  Presentation: Joy [name] is a 55 y.o. female presenting with here with one month of worsening lower extremity edema and shortness of breath on exertion. Additionally, she notes her abdomen getting larger as well. Denies chest pain, palpitations, arm pain, neck pain, jaw pain, or chest pain with exertion. No nausea or diaphoresis. 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 She does not often take stairs, but does not necessarily avoid them. No Orthopnea, she does describe PND. She sleeps with 2 pillows due to GERD for some time prior to the onset of her current symptoms. She smokes 1ppd for 36 years. Drinks ETOH on the weekends and says she has lately trying to "cut back". She denies drinking more than "on the weekends". Her brother died with heart failure at age 77, her younger brother has had Colon cancer. Otherwise no family history of early cardiac death. She has not seen a doctor in many years. She takes no medications on a daily basis, has no allergies to any medications that she knows of, and has no surgical history.   Past Medical History  Diagnosis Date  . Hypertension   . Leg swelling hospitalized 08/08/2015    BLE  . GERD (gastroesophageal reflux disease)     Social History   Social History  . Marital Status: Married    Spouse Name: N/A  . Number of Children: N/A  . Years of Education: N/A   Social History Main Topics  . Smoking status: Current Every Day Smoker -- 1.00 packs/day for 36 years    Types: Cigarettes  . Smokeless tobacco: Former Systems developer    Types: Chew     Comment: "quit chewing in my early '20s"  . Alcohol Use: 13.8 oz/week    12 Cans of beer, 11 Shots of liquor per week  . Drug Use: Yes    Special: Marijuana     Comment: 08/08/2015 "I smoke marijuana when I have it; probably a couple times/week"  . Sexual Activity: Yes    Other Topics Concern  . None   Social History Narrative    ECHO: pending  BNP    Component Value Date/Time   BNP 548.0* 08/08/2015 1230    ProBNP No results found for: PROBNP   Education Assessment and Provision:  Detailed education and instructions provided on heart failure disease management including the following:  Signs and symptoms of Heart Failure When to call the physician Importance of daily weights Low sodium diet Fluid restriction Medication management Anticipated future follow-up appointments  Patient education given on each of the above topics.  Patient acknowledges understanding and acceptance of all instructions.  I spoke with Joy [name] regarding her hospitalization and HF diagnosis.  She tells me that no one has told her that she has HF.  She is awaiting echocardiogram.  I explained what HF is and HF recommendations for home.  She does not have a scale and I will provide one for home use.   I reinforced the importance of daily weights and how weights relate to the signs and symptoms of HF.  I briefly reviewed a low sodium diet and high sodium foods to avoid.  She was not taking medications prior to admission and will likely need medication assistance after discharge.  She seems to lack insight into her illness and has low health literacy.  She does not have a PCP and currently no insurance.     Education Materials:  "Living Better With Heart Failure" Booklet, Daily Weight Tracker Tool    High Risk Criteria for Readmission and/or Poor Patient Outcomes:   EF <30%- pending  2 or more admissions in 6 months- No --New HF  Difficult social situation- yes -no insurance  Demonstrates medication noncompliance- Not on medications prior to admission   Barriers of Care:  New HF, [place], Knowledge and  compliance  Discharge Planning:   Plans to return home with husband in [place].  She would benefit from [facility] for ongoing HF education,  compliance reinforcement and symptom recognition.  She will likely need medication assistance after discharge.   I will discuss case with care management.

## 2015-08-09 NOTE — Progress Notes (Signed)
1745 resting comfortably . Dangling legs at beside. Reinforced elevation of legs

## 2015-08-09 NOTE — Progress Notes (Signed)
  Echocardiogram 2D Echocardiogram has been performed.  Diamond Nickel 08/09/2015, 2:33 PM

## 2015-08-09 NOTE — Research (Signed)
Reds_0  Informed Consent   Subject Name: Joy [name]  Subject met inclusion and exclusion criteria.  The informed consent form, study requirements and expectations were reviewed with the subject and questions and concerns were addressed prior to the signing of the consent form.  The subject verbalized understanding of the trail requirements.  The subject agreed to participate in the Reds_1  trial and signed the informed consent.  The informed consent was obtained prior to performance of any protocol-specific procedures for the subject.  A copy of the signed informed consent was given to the subject and a copy was placed in the subject's medical record.  [name] 08/09/2015, 1220pm

## 2015-08-09 NOTE — Progress Notes (Signed)
Family Medicine Teaching Service Daily Progress Note Intern Pager: [phone]  Patient name: Joy [name] record number: [id number] Date of birth: Jul 27, 1960 Age: 55 y.o. Gender: female  Primary Care Provider: No PCP Per Patient Consultants: Cards  Code Status: DNI  Pt Overview and Major Events to Date:  5/22: admitted for dyspnea and Leg swelling  Assessment and Plan: Joy [name] is a 55 y.o. female admitted with Dyspnea and Lower Extremity Swelling . PMH is significant for GERD, Anemia Iron Deficiency in the past.   # Dyspnea / LE Edema - Symptoms / Exam consistent with likely CHF diagnosis. Preserved Vs. Reduced ejection fraction remains to be differentiated. Symptoms ongoing for over a month. O2 requirement 2L in the ED, BNP 548, Troponin 0.03, EKG with left atrial enlargement, slight T wave changes in the lateral leads, inferior lead compared to previous. No ST elevation or depression. CXR with cardiomegaly, pulmonary vascular congestion / interstitial edema. Marked volume overload to exam. UOP= 5.9 L this admission. - IV Lasix 35m BID for now.  Continue to reassess and adjust dosing accordingly - SpO2 100 on O2- Wean to RA  - TSH 1.270 - Lipid panel Chol 174 TGD 64 HDL 46 LDL115 - A1C 6.4. Consider initiating Glucophage outpatient - Echocardiogram complete. Report pending.  - Consider cards consult  - Initiation of BB, Ace, Lasix etc. Pending echo read, so as to maximize therapy - ASA 829m- Daily BMET - Monitor I/Os  # HTN - likely due to undiagnosed HTN, Volume overload. BPs 125/76-177/109 - Labetalol prn SBP > 160. Had BP 177/109 but did not receive labetalol. - Await Echo report for therapeutic choice.  - Continue diuresing.   # GERD  - protonix here  # Pre-DM. A1c of 6.4. - Lifestyle & diet modifications. Consider starting metformin as O/P  # Iron Deficiency Anemia - Hemoglobin 14.2 here.  MCV 86.3 - CBC as needed.    FEN/GI: Heart Healthy Prophylaxis: Lovenox.   Disposition: Pending further workup.   Subjective:  Feels ok. Endorses leg pain and swelling. Denies CP, SOB, PND, orthopnea, HA or Nausea. Feels that can be weaned to RA.   Objective: Temp:  [97.8 F (36.6 C)-98.6 F (37 C)] 97.9 F (36.6 C) (05/23 1133) Pulse Rate:  [63-84] 82 (05/23 1133) Resp:  [14-22] 18 (05/23 1133) BP: (125-177)/(76-114) 128/87 mmHg (05/23 1133) SpO2:  [90 %-100 %] 95 % (05/23 1133) Weight:  [95.664 kg (210 lb 14.4 oz)-96.072 kg (211 lb 12.8 oz)] 95.664 kg (210 lb 14.4 oz) (05/23 0636)   Physical Exam: General: NAD, lying comfortably in bed.  ENTM: Nares patent, O/P clear without erythema Neck: JVD difficult to appreciate, supple Cardiovascular: RRR, no MGR, normal S1/S2, 2+ distal pulses.   Respiratory: End expiratory wheezes and rales throughout. Slightly increased in BL bases, otherwise clear and good air movement.  Abdomen: Obese, distended, abdominal wall edema to the navel, S, Nontender, Unable to palpate liver / spleen due to habitus. Unable to appreciate hepatojugular reflex MSK: 3+ edema to knee Skin: no erythema of LE, no skin breakdown Neuro: Grossly intact.  Psych: Somewhat flat affect, otherwise appropriate.   Laboratory:  Recent Labs Lab 08/08/15 1234  WBC 5.4  HGB 14.2  HCT 47.7*  PLT 237    Recent Labs Lab 08/08/15 1234 08/09/15 0613  NA 142 142  K 3.7 4.0  CL 102 98*  CO2 35* 37*  BUN 5* 7  CREATININE 0.79 0.75  CALCIUM 9.2 8.8*  PROT  5.5*  --   BILITOT 0.7  --   ALKPHOS 68  --   ALT 27  --   AST 30  --   GLUCOSE 90 106*    Imaging/Diagnostic Tests: Echo report pending   Hershal Coria, Med Student 08/09/2015, 1:36 PM MS4, [place] Intern pager: [phone], text pages welcome  I have separately seen and examined the patient. I have discussed the findings and exam with Student Dr Rosalia Hammers and agree with the  above note.  My changes/additions are outlined in BLUE.   Patient reporting improvement in LE swelling but continued marked swelling.  She reports breathing is improved.  No difficulty eating, voiding.  She reports good response to IV Lasix and says that "I think I need more".  She denies nausea, vomiting, CP.  On exam, breathing normally on [place].  Bibasilar crackles appreciated, intermittent mild expiratory wheeze.  +2-3 pitting edema to knees w/ trace at thighs.  +abdominal wall edema.  No tenderness - Continue IV Lasix BID, consider increasing frequency if needed - Strict I/Os - Daily weights - Wean to room air - F/u echocardiogram  Adrik Khim M. Lajuana Ripple, DO PGY-2, [place]

## 2015-08-09 NOTE — H&P (Deleted)
[facility] Admission History and Physical Service Pager: [phone]  Patient name: Joy [name] record number: [id number] Date of birth: 1960/08/22 Age: 55 y.o. Gender: female  Primary Care Provider: No PCP Per Patient Consultants: None Code Status: DNI  Chief Complaint: Lower extremity Swelling.   Assessment and Plan: Joy [name] is a 55 y.o. female presenting with Dyspnea and Lower Extremity Swelling . PMH is significant for GERD, Anemia Iron Deficiency in the past.   # Dyspnea / LE Edema - Symptoms / Exam consistent with likely CHF diagnosis. Preserved Vs. Reduced ejection fraction remains to be differentiated. Symptoms ongoing for over a month. O2 requirement 2L in the ED, BNP 548, Troponin 0.03, EKG with left atrial enlargement, slight T wave changes in the lateral leads, inferior lead compared to previous. No ST elevation or depression. CXR with cardiomegaly, pulmonary vascular congestion / interstitial edema. Marked volume overload to exam. Uo= 5.9 L this admission. - IV Lasix 63m increased to TID - SpO2 100 on O2- Wean to RA  - TSH 1.270 - Lipid panel  Chol 174  TGD 64  HDL 46   LDL115 - A1C 6.4 - Echocardiogram complete. Report pending.  - Consider cards consult  - BB, Ace, Lasix etc. Pending further workup.  - ASA 850m- Daily BMET  # HTN - likely due to undiagnosed HTN, Volume overload. BPs 125/76-177/109 - Labetalol prn SBP > 160. Had BP 177/109 but did not receive labetalol. - Await Echo report for therapeutic choice.  - Continue diuresing.   # GERD  - protonix here  # Pre-DM. A1c of 6.4. - Lifestyle & diet modifications. Consider starting metformin as O/P  # Iron Deficiency Anemia - Hemoglobin 14.2 here. MCV 86.3 - CBC as needed.    FEN/GI: Heart Healthy Prophylaxis: Lovenox.   Disposition: Pending further workup.   History of Present Illness:  Joy [name] a [age].o. female presenting with here with one month  of worsening lower extremity edema and shortness of breath on exertion. Additionally, she notes her abdomen getting larger as well. Denies chest pain, palpitations, arm pain, neck pain, jaw pain, or chest pain with exertion. No nausea or diaphoresis. 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 She does not often take stairs, but does not necessarily avoid them. No Orthopnea, she does describe PND. She sleeps with 2 pillows due to GERD for some time prior to the onset of her current symptoms. She smokes 1ppd for 36 years. Drinks ETOH on the weekends and says she has lately trying to "cut back". She denies drinking more than "on the weekends". Her brother died with heart failure at age 5959her younger brother has had Colon cancer. Otherwise no family history of early cardiac death. She has not seen a doctor in many years. She takes no medications on a daily basis, has no allergies to any medications that she knows of, and has no surgical history.   Review Of Systems: Per HPI with the following additions: none.  Otherwise the remainder of the systems were negative.  Patient Active Problem List   Diagnosis Date Noted  . [place]estive heart failure) ([facility] [place]  . Heart failure ([facility]2017  . Bilateral lower extremity edema   . Essential hypertension   . Hypoxia   . Hypervolemia   . PELVIC MASS  07/05/2008  . ANEMIA, IRON DEFICIENCY, UNSPEC. 05/16/2006  . TOBACCO DEPENDENCE 05/16/2006  . GASTROESOPHAGEAL REFLUX, NO ESOPHAGITIS 05/16/2006    Past Medical History: Past Medical History  Diagnosis Date  . Hypertension   . Leg swelling hospitalized 08/08/2015    BLE  . GERD (gastroesophageal reflux disease)     Past Surgical History: Past Surgical History  Procedure Laterality Date  . Tubal ligation      Social History: Social History  Substance Use Topics  . Smoking status:  Current Every Day Smoker -- 1.00 packs/day for 36 years    Types: Cigarettes  . Smokeless tobacco: Former Systems developer    Types: Chew     Comment: "quit chewing in my early '20s"  . Alcohol Use: 13.8 oz/week    12 Cans of beer, 11 Shots of liquor per week   Additional social history: 1ppd Cigarettes, Drinks ETOH "on the weekends". She does not quantify.   Please also refer to relevant sections of EMR.  Family History: History reviewed. No pertinent family history. (Dad - shot and deceased, Mom - unknown pt. In foster care, Brother died with heart failure, Brother- colon cancer living. If not completed, MUST add something in)  Allergies and Medications: No Known Allergies No current facility-administered medications on file prior to encounter.   Current Outpatient Prescriptions on File Prior to Encounter  Medication Sig Dispense Refill  . amoxicillin-clavulanate (AUGMENTIN) 875-125 MG per tablet Take 1 tablet by mouth 2 (two) times daily. (Patient not taking: Reported on 08/08/2015) 14 tablet 0  . ibuprofen (ADVIL,MOTRIN) 200 MG tablet Take 400 mg by mouth every 4 (four) hours as needed for moderate pain (mouth pain).      Objective: BP 148/91 mmHg  Pulse 81  Temp(Src) 98.6 F (37 C) (Oral)  Resp 18  Ht 5' 6.5" (1.689 m)  Wt 95.664 kg (210 lb 14.4 oz)  BMI 33.53 kg/m2  SpO2 100% Exam: General: NAD, resting comfortably on the side of the bed.  Eyes: EOMI, PERRLA ENTM: Nares patent, O/P clear without erythema Neck: JVD to the ear, FROM, supple Cardiovascular: RRR, no MGR, normal S1/S2, 2+ distal pulses. JVD to the ear.  Respiratory: Slight crackles in BL bases, otherwise clear and good air movement.  Abdomen: Obese, distended, abdominal wall edema to the navel, S, Nontender, Unable to palpate liver / spleen due to habitus.  MSK: S, NT, ND, 3+ edema to the thigh Skin: no erythema of LE, no skin breakdown Neuro: Grossly in tact.  Psych: Somewhat flat affect, otherwise appropriate.    Labs and Imaging: CBC BMET   Recent Labs Lab 08/08/15 1234  WBC 5.4  HGB 14.2  HCT 47.7*  PLT 237    Recent Labs Lab 08/09/15 0613  NA 142  K 4.0  CL 98*  CO2 37*  BUN 7  CREATININE 0.75  GLUCOSE 106*  CALCIUM 8.8*     BNP 548 CXR - pulmonary vascular congestion.  EKG - LAE, Flattened and inverted T-waves diffusely. No significant change from previous.  Troponin - 0.03   Joy [name], Med Student 08/09/2015, 8:16 AM MS4, [place] Intern pager: [phone], text pages welcome

## 2015-08-09 NOTE — Progress Notes (Signed)
Pt A/Ox4, steady on feet, moderate fall risk, pt given fall prevention safety plan sheet, pt refusing bed alarm.

## 2015-08-09 NOTE — Evaluation (Signed)
Physical Therapy Evaluation Patient Details Name: Joy [name] MRN: [id number] DOB: 06-Dec-1960 Today's Date: 08/09/2015   History of Present Illness  Pt is a 55 y/o F admitted w/ worsening LE edema and SOB on exertion due to CHF.  Pt's PMH includes GERD, anemia.    Clinical Impression  Pt admitted with above diagnosis.  Pt is independent w/ all mobility and no deficits noted w/ high level balance activities and stair training.  However, SpO2 begins dropping immediately w/ any exertion and drops as low as 80% on RA w/ ambulation.  Therefore, believe that Cardiopulmonary Rehab is the most appropriate follow-up approach to address cardiopulmonary status w/ exertion, RN made aware.  No skilled PT needs identified as pt is Independent, PT is signing off.    Follow Up Recommendations No PT follow up    Equipment Recommendations  None recommended by PT    Recommendations for Other Services Other (comment) (Cardiopulmonary Rehab)     Precautions / Restrictions Precautions Precautions: None Restrictions Weight Bearing Restrictions: No      Mobility  Bed Mobility               General bed mobility comments: Pt sitting in recliner chair upon PT arrival  Transfers Overall transfer level: Independent Equipment used: None             General transfer comment: No instability noted.    Ambulation/Gait Ambulation/Gait assistance: Independent Ambulation Distance (Feet): 200 Feet Assistive device: None Gait Pattern/deviations: WFL(Within Functional Limits)   Gait velocity interpretation: at or above normal speed for age/gender General Gait Details: No instability noted w/ high level balance activities as documented below.  SpO2 begins dropping immediately w/ any exertion and drops as low as 80% on RA w/ ambulation.  Therefore, multiple standing rest breaks taken w/ SpO2 quickly up to mid 90s each time.   Stairs Stairs: Yes Stairs assistance: Independent Stair Management:  One rail Right;Forwards;Step to pattern Number of Stairs: 5 General stair comments: No assist needed.  No instability noted.  Wheelchair Mobility    Modified Rankin (Stroke Patients Only)       Balance Overall balance assessment: Independent                       [place]berg - Eyes Opened: 30 (WNL) [place] - Eyes Closed: 30 (WNL) High level balance activites: Backward walking;Sudden stops;Head turns;Direction changes;Turns;Other (comment) (bending to pick item off floor) High Level Balance Comments: No instabilty noted w/ high level balance activities  Standardized Balance Assessment Standardized Balance Assessment : Dynamic Gait Index   Dynamic Gait Index Level Surface: Normal Change in Gait Speed: Normal Gait with Horizontal Head Turns: Normal Gait with Vertical Head Turns: Normal Gait and Pivot Turn: Normal Step Over Obstacle: Normal Step Around Obstacles: Normal Steps: Mild Impairment (used rail) Total Score: 23       Pertinent Vitals/Pain Pain Assessment: No/denies pain    Home Living Family/patient expects to be discharged to:: Private residence Living Arrangements: Spouse/significant other Available Help at Discharge: Family;Friend(s);Available PRN/intermittently (daughter lives very close) Type of Home: House Home Access: Stairs to enter Entrance Stairs-Rails: Chemical engineer of Steps: 4 Home Layout: One level Home Equipment: None      Prior Function Level of Independence: Independent               Hand Dominance   Dominant Hand: Left    Extremity/Trunk Assessment   Upper Extremity Assessment: Overall WFL for tasks assessed  Lower Extremity Assessment: Overall WFL for tasks assessed      Cervical / Trunk Assessment: Normal  Communication   Communication: No difficulties  Cognition Arousal/Alertness: Awake/alert Behavior During Therapy: WFL for tasks assessed/performed Overall Cognitive Status:  Within Functional Limits for tasks assessed                      General Comments General comments (skin integrity, edema, etc.): Educated pt on significance and potential consequences of SpO2 dropping below 88% and the importance of taking rest breaks and performing pursed lip breathing.  Pt verbalized understanding.    Exercises        Assessment/Plan    PT Assessment Patent does not need any further PT services  PT Diagnosis Other (comment) (dyspnea on exertion)   PT Problem List    PT Treatment Interventions     PT Goals (Current goals can be found in the Care Plan section) Acute Rehab PT Goals Patient Stated Goal: to go home PT Goal Formulation: All assessment and education complete, DC therapy    Frequency     Barriers to discharge        Co-evaluation               End of Session Equipment Utilized During Treatment: Gait belt Activity Tolerance: Treatment limited secondary to medical complications (Comment);Patient tolerated treatment well (hypoxia) Patient left: in chair;with call bell/phone within reach Nurse Communication: Mobility status;Other (comment) (SpO2; would benefit from Cardiopulmonary rehab)    Functional Assessment Tool Used: Clinical Judgement Functional Limitation: Mobility: Walking and moving around Mobility: Walking and Moving Around Current Status [phone]): 0 percent impaired, limited or restricted Mobility: Walking and Moving Around Goal Status [phone]): 0 percent impaired, limited or restricted Mobility: Walking and Moving Around Discharge Status [phone]): 0 percent impaired, limited or restricted    Time: 1112-1130 PT Time Calculation (min) (ACUTE ONLY): 18 min   Charges:   PT Evaluation $PT Eval Low Complexity: 1 Procedure     PT G Codes:   PT G-Codes **NOT FOR INPATIENT CLASS** Functional Assessment Tool Used: Clinical Judgement Functional Limitation: Mobility: Walking and moving around Mobility: Walking and Moving  Around Current Status (R4431): 0 percent impaired, limited or restricted Mobility: Walking and Moving Around Goal Status (V4008): 0 percent impaired, limited or restricted Mobility: Walking and Moving Around Discharge Status (Q7619): 0 percent impaired, limited or restricted   Collie Siad PT, DPT  Pager: [phone] Phone: [phone] 08/09/2015, 11:50 AM

## 2015-08-10 ENCOUNTER — Inpatient Hospital Stay (HOSPITAL_COMMUNITY): Payer: MEDICAID

## 2015-08-10 ENCOUNTER — Encounter (HOSPITAL_COMMUNITY): Payer: Self-pay | Admitting: *Deleted

## 2015-08-10 DIAGNOSIS — J449 Chronic obstructive pulmonary disease, unspecified: Secondary | ICD-10-CM

## 2015-08-10 DIAGNOSIS — I50810 Right heart failure, unspecified: Secondary | ICD-10-CM

## 2015-08-10 DIAGNOSIS — R0600 Dyspnea, unspecified: Secondary | ICD-10-CM | POA: Diagnosis present

## 2015-08-10 DIAGNOSIS — I272 Other secondary pulmonary hypertension: Secondary | ICD-10-CM

## 2015-08-10 DIAGNOSIS — I509 Heart failure, unspecified: Secondary | ICD-10-CM

## 2015-08-10 HISTORY — DX: Chronic obstructive pulmonary disease, unspecified: J44.9

## 2015-08-10 HISTORY — DX: Right heart failure, unspecified: I50.810

## 2015-08-10 LAB — VITAMIN B12: VITAMIN B 12: 511 pg/mL (ref 180–914)

## 2015-08-10 LAB — IRON AND TIBC
Iron: 158 ug/dL (ref 28–170)
Saturation Ratios: 29 % (ref 10.4–31.8)
TIBC: 542 ug/dL — ABNORMAL HIGH (ref 250–450)
UIBC: 384 ug/dL

## 2015-08-10 LAB — BASIC METABOLIC PANEL
ANION GAP: 9 (ref 5–15)
BUN: 6 mg/dL (ref 6–20)
CALCIUM: 9 mg/dL (ref 8.9–10.3)
CO2: 38 mmol/L — ABNORMAL HIGH (ref 22–32)
CREATININE: 0.73 mg/dL (ref 0.44–1.00)
Chloride: 93 mmol/L — ABNORMAL LOW (ref 101–111)
Glucose, Bld: 92 mg/dL (ref 65–99)
Potassium: 3.6 mmol/L (ref 3.5–5.1)
SODIUM: 140 mmol/L (ref 135–145)

## 2015-08-10 LAB — BLOOD GAS, ARTERIAL
Acid-Base Excess: 12.9 mmol/L — ABNORMAL HIGH (ref 0.0–2.0)
BICARBONATE: 37.9 meq/L — AB (ref 20.0–24.0)
Drawn by: 270221
FIO2: 0.21
O2 Saturation: 85.5 %
PCO2 ART: 57.3 mmHg — AB (ref 35.0–45.0)
PH ART: 7.436 (ref 7.350–7.450)
PO2 ART: 53.2 mmHg — AB (ref 80.0–100.0)
Patient temperature: 98.6
TCO2: 39.6 mmol/L (ref 0–100)

## 2015-08-10 LAB — FERRITIN: FERRITIN: 15 ng/mL (ref 11–307)

## 2015-08-10 LAB — FOLATE: FOLATE: 9.4 ng/mL (ref >5.9)

## 2015-08-10 LAB — RETICULOCYTES
RBC.: 6.02 MIL/uL — AB (ref 3.87–5.11)
RETIC COUNT ABSOLUTE: 72.2 10*3/uL (ref 19.0–186.0)
RETIC CT PCT: 1.2 % (ref 0.4–3.1)

## 2015-08-10 LAB — SEDIMENTATION RATE: Sed Rate: 0 mm/hr (ref 0–22)

## 2015-08-10 MED ORDER — IOPAMIDOL (ISOVUE-370) INJECTION 76%
INTRAVENOUS | Status: AC
  Administered 2015-08-10: 100 mL
  Filled 2015-08-10: qty 100

## 2015-08-10 MED ORDER — LISINOPRIL 2.5 MG PO TABS: 2.5000 mg | ORAL_TABLET | Freq: Every day | ORAL | Status: DC

## 2015-08-10 MED ORDER — MOMETASONE FURO-FORMOTEROL FUM 100-5 MCG/ACT IN AERO
2.0000 | INHALATION_SPRAY | Freq: Two times a day (BID) | RESPIRATORY_TRACT | Status: DC
  Administered 2015-08-11 (×2): 2 via RESPIRATORY_TRACT
  Filled 2015-08-10: qty 8.8

## 2015-08-10 MED ORDER — LISINOPRIL 5 MG PO TABS
5.0000 mg | ORAL_TABLET | Freq: Every day | ORAL | Status: DC
  Administered 2015-08-10 – 2015-08-13 (×4): 5 mg via ORAL
  Filled 2015-08-10 (×4): qty 1

## 2015-08-10 NOTE — Progress Notes (Signed)
OT Cancellation Note  Patient Details Name: Joy [name] MRN: [id number] DOB: 1961-02-16   Cancelled Treatment:    Reason Eval/Treat Not Completed: Patient at procedure or test/ unavailable  Pt at CAT scan per family- will check on pt later in day or next day  Kari Baars, [place]  [name] D 08/10/2015, 12:37 PM

## 2015-08-10 NOTE — Clinical Documentation Improvement (Signed)
Family Medicine  Can the diagnosis of CHF be further specified?    Acuity - Acute, Chronic, Acute on Chronic   Type - Systolic, Diastolic, Systolic and Diastolic  Other  Clinically Undetermined  Document any associated diagnoses/conditions Please update your documentation within the medical record to reflect your response to this query. Thank you.  Supporting Information: (As per notes)  "Acute congestive heart failure" "# Dyspnea / LE Edema - Symptoms / Exam consistent with likely CHF diagnosis. Preserved Vs. &Reduced ejection fraction remains to be differentiated. Symptoms ongoing for over a month." "Acute congestive heart failure, unspecified congestive heart failure type ([place])"  Echo   08/09/15 Study Conclusions - Left ventricle: The cavity size was normal. Wall thickness was normal. Systolic function was normal. The estimated ejection fraction was in the range of 60% to 65%. Wall motion was normal; there were no regional wall motion abnormalities. Doppler parameters are consistent with abnormal left ventricular relaxation (grade 1 diastolic dysfunction). - Ventricular septum: The contour showed diastolic flattening and systolic flattening. - Right ventricle: The cavity size was mildly &dilated. Systolic function was mildly &reduced. - Right atrium: The atrium was moderately to severely &dilated. - Pulmonary arteries: Systolic pressure was severely increased. PA peak pressure: 72 mm Hg (S).  Please exercise your independent, professional judgment when responding. A specific answer is not anticipated or expected.  Thank You, Alessandra Grout, RN, BSN, CCDS,Clinical Documentation Specialist:  [phone]  [phone]=[employer]- Health Information Management

## 2015-08-10 NOTE — Progress Notes (Signed)
Nutrition Education Note  RD consulted for nutrition education regarding new onset CHF.  RD provided "Low Sodium Nutrition Therapy" handout from the Academy of Nutrition and Dietetics. Reviewed patient's dietary recall. Provided examples on ways to decrease sodium intake in diet. Discouraged intake of processed foods and use of salt shaker. Encouraged fresh fruits and vegetables as well as whole grain sources of carbohydrates to maximize fiber intake.   RD discussed why it is important for patient to adhere to diet recommendations, and emphasized the role of fluids, foods to avoid, and importance of weighing self daily. Teach back method used.  Expect fair compliance. Pt was unable to teach-back sodium guidelines, but she was able to identify a few foods that she needs to stop eating; canned vegetables, ham, sauces, and salted crackers.   Body mass index is 31.58 kg/(m^2). Pt states that she usually weighs 179 lbs. Pt meets criteria for Overweight based on usual weight/BMI.  Current diet order is Heart Healthy, patient is consuming approximately 75% of meals at this time. Labs and medications reviewed. No further nutrition interventions warranted at this time. RD contact information provided. If additional nutrition issues arise, please re-consult RD.   Scarlette Ar RD, LDN Inpatient Clinical Dietitian Pager: [phone] After Hours Pager: [phone]

## 2015-08-10 NOTE — Consult Note (Signed)
CARDIOLOGY CONSULT NOTE   Patient ID: Joy [name] MRN: [id number] DOB/AGE: 06/26/1960 55 y.o.  Admit date: 08/08/2015  Primary Physician: No PCP Primary Cardiologist: new Requesting MD: Dr. Hershal Coria Reason for Consultation: CHF  HPI: Joy [name] is a 55 year old female with a past medical history of HTN, GERD, and COPD. No prior cardiac history.   She presented to the ED on 08/08/15 with bilateral lower edema for one month, increasing dyspnea on exertion, and abdominal swelling. Her oxygen saturation was 90% at rest on room air. BMP was 548, troponin 0.03, EKG showed left atrial enlargement. Chest x-ray showed pulmonary vascular congestion and interstitial edema.   She was given IV diuresis and echocardiogram showed normal EF of 60-65%, no wall motion abnormalities, and grade 1 diastolic dysfunction. Her PA pressure was elevated at 67m Hg. Her right ventricle is mildly dilated and systolic function was mildly reduced. Right atrium is moderately to severely dilated. Cardiology consulted to help manage CHF and pulmonary hypertension.  She walks to the bus each day to take her to work and has noticed that she has to take multiple breaks lately. She sleeps with 2 pillows, but has always slept with 2 pillows because of her GERD. Denies orthopnea, does occasionally have some PND.  She is a current smoker, and drinks ETOH on the weekends. Eats mostly fried foods, and has never watched her fluid intake.    Past Medical History  Diagnosis Date  . Hypertension   . Leg swelling hospitalized 08/08/2015    BLE  . GERD (gastroesophageal reflux disease)      Past Surgical History  Procedure Laterality Date  . Tubal ligation      No Known Allergies  I have reviewed the patient's current medications . aspirin EC  81 mg Oral Daily  . enoxaparin (LOVENOX) injection  40 mg Subcutaneous Q24H  . furosemide  40 mg Intravenous BID  . iopamidol      . pantoprazole  20 mg Oral Daily  .  sodium chloride flush  3 mL Intravenous Q12H     sodium chloride, acetaminophen, labetalol, ondansetron (ZOFRAN) IV, sodium chloride flush  Prior to Admission medications   Medication Sig Start Date End Date Taking? Authorizing Provider  amoxicillin-clavulanate (AUGMENTIN) 875-125 MG per tablet Take 1 tablet by mouth 2 (two) times daily. Patient not taking: Reported on 08/08/2015 01/12/14   RCarmin Muskrat MD  ibuprofen (ADVIL,MOTRIN) 200 MG tablet Take 400 mg by mouth every 4 (four) hours as needed for moderate pain (mouth pain).    Historical Provider, MD     Social History   Social History  . Marital Status: Married    Spouse Name: N/A  . Number of Children: N/A  . Years of Education: N/A   Occupational History  . Not on file.   Social History Main Topics  . Smoking status: Current Every Day Smoker -- 1.00 packs/day for 36 years    Types: Cigarettes  . Smokeless tobacco: Former [employer] developer   Types: Chew     Comment: "quit chewing in my early '20s"  . Alcohol Use: 13.8 oz/week    12 Cans of beer, 11 Shots of liquor per week  . Drug Use: Yes    Special: Marijuana     Comment: 08/08/2015 "I smoke marijuana when I have it; probably a couple times/week"  . Sexual Activity: Yes   Other Topics Concern  . Not on file   Social History Narrative  No family status information on file.   History reviewed. No pertinent family history.   ROS:  Full 14 point review of systems complete and found to be negative unless listed above.  Physical Exam: Blood pressure 136/91, pulse 91, temperature 98.5 F (36.9 C), temperature source Oral, resp. rate 18, height 5' 6.5" (1.689 m), weight 198 lb 9.6 oz (90.084 kg), SpO2 92 %.  General: Well developed, well nourished, female in no acute distress Head: Eyes PERRLA, No xanthomas.   Normocephalic and atraumatic, oropharynx without edema or exudate.  Lungs: Bilateral lower lobes with crackles, diminished in all fields.  Heart: HRRR S1 S2, no  rub/gallop, No murmur. pulses are 2+ extrem.   Neck: No carotid bruits. No lymphadenopathy.  Abdomen: Bowel sounds present, abdomen soft and non-tender without masses or hernias noted. Msk:  No spine or cva tenderness. No weakness, no joint deformities or effusions. Extremities: No clubbing or cyanosis.  +2 +3 pretibial edema.  Neuro: Alert and oriented X 3. No focal deficits noted. Psych:  Good affect, responds appropriately Skin: No rashes or lesions noted.  Labs:   Lab Results  Component Value Date   WBC 5.4 08/08/2015   HGB 14.2 08/08/2015   HCT 47.7* 08/08/2015   MCV 86.3 08/08/2015   PLT 237 08/08/2015    Recent Labs  08/08/15 1931  INR 1.10    Recent Labs Lab 08/08/15 1234  08/10/15 0429  NA 142  < > 140  K 3.7  < > 3.6  CL 102  < > 93*  CO2 35*  < > 38*  BUN 5*  < > 6  CREATININE 0.79  < > 0.73  CALCIUM 9.2  < > 9.0  PROT 5.5*  --   --   BILITOT 0.7  --   --   ALKPHOS 68  --   --   ALT 27  --   --   AST 30  --   --   GLUCOSE 90  < > 92  ALBUMIN 3.3*  --   --   < > = values in this interval not displayed. MAGNESIUM  Date Value Ref Range Status  08/08/2015 1.8 1.7 - 2.4 mg/dL Final    Recent Labs  08/08/15 1234 08/08/15 1815 08/09/15 0011 08/09/15 0613  TROPONINI 0.03 0.03 0.03 <0.03    Echo: 08/09/15 - Left ventricle: The cavity size was normal. Wall thickness was  normal. Systolic function was normal. The estimated ejection  fraction was in the range of 60% to 65%. Wall motion was normal;  there were no regional wall motion abnormalities. Doppler  parameters are consistent with abnormal left ventricular  relaxation (grade 1 diastolic dysfunction). - Ventricular septum: The contour showed diastolic flattening and  systolic flattening. - Right ventricle: The cavity size was mildly dilated. Systolic  function was mildly reduced. - Right atrium: The atrium was moderately to severely dilated. - Pulmonary arteries: Systolic pressure was  severely increased. PA  peak pressure: 72 mm Hg (S).  ECG:  NSR with diffuse T wave inversion   Radiology:  Dg Chest 2 View  08/08/2015  CLINICAL DATA:  Bilateral leg swelling EXAM: CHEST  2 VIEW COMPARISON:  01/12/2014 FINDINGS: Cardiac enlargement with mild vascular congestion suggesting fluid overload. Negative for edema or effusion. Negative for pneumonia Pulmonary hyperinflation. IMPRESSION: Pulmonary vascular congestion without edema or effusion COPD Electronically Signed   By: Franchot Gallo M.D.   On: 08/08/2015 13:13    ASSESSMENT AND PLAN:  Active Problems:   CHF (congestive heart failure) (HCC)   Heart failure (HCC)   Acute congestive heart failure (HCC)   Right heart failure (HCC)   COPD (chronic obstructive pulmonary disease) ([place])  1. Acute on chronic diastolic CHF: Patient presented with increasing DOE, bilateral lower extremity edema, elevated BNP, and chest x ray consist with CHF. Patient has responded well to IV diuresis, weight is down 13 pounds, she is negative 8.5L. Her renal function is stable currently. She needs continued IV diuresis, still volume overloaded on the exam and SOB at rest.   Her right atrium and right ventricle are enlarged. She has a history of COPD.CTA done today, no PE. She would benefit from right heart cath once she is euvolemic to assess her PAH further, but first needs optimization of management of COPD and presumed OSA.   2. Pulmonary Hypertension: Possible etiologies include COPD, OSA or interstitial lung disease (group 3), or chronic thromboembolic PAH (group 4). Primary team has ordered CTA. Her husband reports that she snores at night and sometimes he thinks that she is not taking a breath. She has never had a sleep study, will need one outpatient.   3. HTN: Patient has SBP's in 140-150's. She would benefit from addition of ACE.   4. HLD: LDL is 115 this admission, would add high dose statin.     Signed: Arbutus Leas,  NP 08/10/2015 12:19 PM Pager [phone]  Co-Sign MD

## 2015-08-10 NOTE — Progress Notes (Signed)
Family Medicine Teaching Service Daily Progress Note Intern Pager: [phone]  Patient name: Joy [name] record number: [id number] Date of birth: 1961/02/04 Age: 55 y.o. Gender: female  Primary Care Provider: No PCP Per Patient Consultants: Cards  Code Status: DNI  Pt Overview and Major Events to Date:  5/22: admitted for dyspnea and Leg swelling  Assessment and Plan: Joy [name] is a 55 y.o. female admitted with Dyspnea and Lower Extremity Swelling. PMH is significant for GERD, Anemia Iron Deficiency in the past.   # New onset Right HFpEF:  Sxs > 73mo The estimated ejection fraction was in the range of 60% to 65%. Doppler parameters are consistent with abnormal left ventricular relaxation (grade 1 diastolic dysfunction). Right ventricle: The cavity size was mildly dilated. Systolic function was mildly reduced. Right atrium: The atrium was moderately to severely dilated. Pulmonary arteries: Systolic pressure was severely increased. PA peak pressure: 72 mm Hg (S). Admission CXR with cardiomegaly, pulmonary vascular congestion / interstitial edema. Marked volume overload to exam. UOP= 6.74 L this admission. - IV Lasix 448mBID for now. Continue to reassess and adjust dosing accordingly - Cards consulted  - Check SpO2 with exercise  - Consider staringt ACE 2.5 mg BID following am BMET.  - Consider CCB if lung disease is likely. - Avoid BB given potential lung disease  - ASA 8179m Daily BMET.   - Monitor I/Os  # Pulmonary HTN. Pulmonary arteries: Systolic pressure was severely increased. PA  peak pressure: 72 mm Hg (S). DDx includes chronic lung disease with hypoxemia vs. OSA/PHS vs. Interstitial lung disease vs. Chronic PEs  vs. Sarcoidosis. Sarcoidosis less likely given normal alk phosph. Considering chronic PE. HR 81-91, upper limit of nl. However, difficult to assess tachycardia given cardiac disease. CXR showed Pulmonary vascular congestion without edema or effusion and COPD  but did report on ground-glass opacity. LFTs wnl. Hgb 14.2 and elevated RBC 5.3 although volume overloaded. Likely 2/2 chronic hypoxia. Hypercarbic to 38.  - HIV and ANA ordered. ESR ordered.  - ABGs today - CTA today - Cards consulted - Consider right heart cath once euvolemic, awaiting cards recs - Consider PFTs when euvolemic  - Consider Pulm consult   #?COPD. No previous dx of COPD. 25 years smoking history. CXR reports hyperventilation and impression suggestive of COPD. Physical exam did not reveal prolonged expiratory phase or wheezes. She's not SOB. SpO2 of 92-100 on RA. - ABGs today - Smoking cessation - PFTs O/P  # HTN Volume overload. BPs 125/76-177/109 - Labetalol prn SBP > 160. Had BP 177/109 but did not receive labetalol. - Continue diuresing.  - Consider starting ACE as above  # GERD  - protonix here  # Pre-DM. A1c of 6.4. - Lifestyle & diet modifications. Consider starting metformin as O/P  # Iron Deficiency Anemia - Hemoglobin 14.2 here. MCV 86.3. Elevated RBC and hematocrit but normocytic and hypochromic - Iron & anemia studies today  FEN/GI: Heart Healthy Prophylaxis: Lovenox.   Disposition: Pending further workup.   Subjective:  Feels ok. Endorses leg pain and swelling. Denies CP, SOB, PND, orthopnea, HA or Nausea. FriMatilde [name] be here afternoon- that's who she'd like to have education and conversations about treatment with.  Objective: Temp:  [97.9 F (36.6 C)-98.6 F (37 C)] 98.5 F (36.9 C) (05/24 0545) Pulse Rate:  [81-91] 91 (05/24 0545) Resp:  [16-18] 18 (05/24 0545) BP: (128-148)/(84-98) 136/91 mmHg (05/24 0545) SpO2:  [92 %-100 %] 92 % (05/24 0545) Weight:  [  90.084 kg (198 lb 9.6 oz)] 90.084 kg (198 lb 9.6 oz) (05/24 0545)   Physical Exam: General: NAD, eating breakfast.  ENTM: Nares patent, O/P clear without erythema Neck: JVD difficult to appreciate but maybe at 4 fingers above clavicle, supple Cardiovascular: RRR, no MGR, normal  S1/S2.   Respiratory: CTA. Bibasilar crackles not appreciated today.   Abdomen: Obese, distended, abdominal wall edema to the navel, S, Nontender, Unable to palpate liver / spleen due to habitus. Hepatojugular reflexnot appreciated MSK: 3+ edema to below the knee bilaterally Skin: no erythema of LE, no skin breakdown Neuro: Grossly intact.  Psych: Somewhat flat affect, otherwise appropriate.   Laboratory:  Recent Labs Lab 08/08/15 1234  WBC 5.4  HGB 14.2  HCT 47.7*  PLT 237    Recent Labs Lab 08/08/15 1234 08/09/15 0613 08/10/15 0429  NA 142 142 140  K 3.7 4.0 3.6  CL 102 98* 93*  CO2 35* 37* 38*  BUN 5* 7 6  CREATININE 0.79 0.75 0.73  CALCIUM 9.2 8.8* 9.0  PROT 5.5*  --   --   BILITOT 0.7  --   --   ALKPHOS 68  --   --   ALT 27  --   --   AST 30  --   --   GLUCOSE 90 106* 92    Imaging/Diagnostic Tests: Echo 5/23: - Left ventricle: The cavity size was normal. Wall thickness was  normal. Systolic function was normal. The estimated ejection fraction was in the range of 60% to 65%. Wall motion was normal;  there were no regional wall motion abnormalities. Doppler parameters are consistent with abnormal left ventricular relaxation (grade 1 diastolic dysfunction). - Ventricular septum: The contour showed diastolic flattening and systolic flattening. - Right ventricle: The cavity size was mildly dilated. Systolic function was mildly reduced. - Right atrium: The atrium was moderately to severely dilated. - Pulmonary arteries: Systolic pressure was severely increased. PA peak pressure: 72 mm Hg (S).  Hershal Coria, Med Student 08/10/2015, 7:35 AM MS4, [place] Intern pager: [phone], text pages welcome   I agree with the above evaluation, assessment, and plan. Any correctional changes can be noted in [facility].   Joy Hacker, MD Family Medicine Resident - PGY 2  S: Feeling better, edema improving, SOB is much improved.   [name]  Vitals:   08/10/15 0545 08/10/15 1224  BP: 136/91 155/98  Pulse: 91 81  Temp: 98.5 F (36.9 C) 98.4 F (36.9 C)  Resp: 18 20  Gen: NAD, AAOx3 CV: RRR, no audible murmurs, S1/S2, 2+ distal pulses. JVD6 cm Resp: CTA B, appropriate rate, unlabored.  Abd: S, NT, ND, +BS, no bowel wall edema Ext: 2+ edema to the knees b/l Skin: no rashes, no lesions  A/P: 55 y/o F with volume overload. Most likely 2/2 Pulmonary HTN and RH failure. Less likely acute on chronic diastolic heart failure. PHTN Group 1,3,or 4 most likely.  - Improving edema. Continue diuresis.  - CTA to r/o thromboemboilc disease / characterize lung parenchyma - Cards to see. She may need RHC  - Needs PFT's as an outpatient.  - ANA, RF, HIV, ESR - Calcium is normal, alp is normal.  - Improving overall. Hopefully will reach euvolemia soon.  - ACE I to be added if Cr remains normal after CTA tomorrow.

## 2015-08-10 NOTE — Care Management Note (Signed)
Case Management Note  Patient Details  Name: Joy [name] MRN: [id number] Date of Birth: 1961-02-18  Subjective/Objective:        Admitted with CHF            Action/Plan: Patient lives at home with spouse, works at a [employer]; No PCP, No medical insurance. Patient has not seen a physician in years; Patient is agreeable to go to the [facility] for follow up medical care; Information given to the patient about the Clinic; she can get her prescriptions filled there also. CM stressed the importance of follow up care. Lots of teaching needed with the patient. Nutritional Consult placed to help the patient make wise food choices. CM will continue to follow for DCP Expected Discharge Date:    possibly 08/13/2015              Expected Discharge Plan:  Home/Self Care  In-House Referral:   Dietitian  Discharge planning Services  CM Consult  Choice offered to:  Patient, NA  Status of Service:  In process, will continue to follow  Sherrilyn Rist [phone] 08/10/2015, 10:47 AM

## 2015-08-10 NOTE — Progress Notes (Signed)
Notified by Ashley Mariner, Resp Therapist, of critical ABG lab results, as follows: PH = 7.436 CO2 = 57.3 PO2 = 53.2 BiCarb = 37.9  Reported the above labs to Dr. Minda Ditto.

## 2015-08-10 NOTE — Progress Notes (Signed)
Pt is not available for arterial blood gas at this time. Will notify RN to call when pt returns to the floor.

## 2015-08-11 DIAGNOSIS — I5033 Acute on chronic diastolic (congestive) heart failure: Secondary | ICD-10-CM

## 2015-08-11 DIAGNOSIS — I50811 Acute right heart failure: Secondary | ICD-10-CM

## 2015-08-11 DIAGNOSIS — R0600 Dyspnea, unspecified: Secondary | ICD-10-CM

## 2015-08-11 HISTORY — DX: Acute right heart failure: I50.811

## 2015-08-11 HISTORY — DX: Acute on chronic diastolic (congestive) heart failure: I50.33

## 2015-08-11 LAB — CBC
HEMATOCRIT: 50.9 % — AB (ref 36.0–46.0)
HEMOGLOBIN: 15.5 g/dL — AB (ref 12.0–15.0)
MCH: 26.2 pg (ref 26.0–34.0)
MCHC: 30.5 g/dL (ref 30.0–36.0)
MCV: 86 fL (ref 78.0–100.0)
Platelets: 227 10*3/uL (ref 150–400)
RBC: 5.92 MIL/uL — ABNORMAL HIGH (ref 3.87–5.11)
RDW: 13.8 % (ref 11.5–15.5)
WBC: 5 10*3/uL (ref 4.0–10.5)

## 2015-08-11 LAB — BASIC METABOLIC PANEL
Anion gap: 7 (ref 5–15)
BUN: 6 mg/dL (ref 6–20)
CHLORIDE: 92 mmol/L — AB (ref 101–111)
CO2: 42 mmol/L — AB (ref 22–32)
Calcium: 9.4 mg/dL (ref 8.9–10.3)
Creatinine, Ser: 0.81 mg/dL (ref 0.44–1.00)
GFR calc Af Amer: >60 mL/min (ref >60)
GFR calc non Af Amer: >60 mL/min (ref >60)
GLUCOSE: 92 mg/dL (ref 65–99)
POTASSIUM: 4 mmol/L (ref 3.5–5.1)
Sodium: 141 mmol/L (ref 135–145)

## 2015-08-11 LAB — HIV ANTIBODY (ROUTINE TESTING W REFLEX): HIV Screen 4th Generation wRfx: NONREACTIVE

## 2015-08-11 LAB — RHEUMATOID FACTOR: RHEUMATOID FACTOR: 47.6 [IU]/mL — AB (ref 0.0–13.9)

## 2015-08-11 LAB — ANTINUCLEAR ANTIBODIES, IFA: ANTINUCLEAR ANTIBODIES, IFA: NEGATIVE

## 2015-08-11 NOTE — Evaluation (Signed)
  Occupational Therapy Evaluation Patient Details Name: Joy [name] MRN: [id number] DOB: November 23, 1960 Today's Date: 2015-08-31    History of Present Illness Pt is a 55 y/o F admitted w/ worsening LE edema and SOB on exertion due to CHF.  Pt's PMH includes GERD, anemia.   Clinical Impression   Pt overall at baseline with ADL activity.  OT went over energy conservation strategies with pt.  Pt not very interested in tips for making ADL activity easier.    Follow Up Recommendations  No OT follow up    Equipment Recommendations  None recommended by OT       Precautions / Restrictions Precautions Precautions: None      Mobility Bed Mobility Overal bed mobility: Independent                Transfers Overall transfer level: Independent                         ADL Overall ADL's : At baseline                                                       Pertinent Vitals/Pain Pain Assessment: No/denies pain     Hand Dominance Left      Communication Communication Communication: No difficulties   Cognition Arousal/Alertness: Awake/alert Behavior During Therapy: WFL for tasks assessed/performed Overall Cognitive Status: Within Functional Limits for tasks assessed                                Home Living Family/patient expects to be discharged to:: Private residence Living Arrangements: Spouse/significant other Available Help at Discharge: Family;Friend(s);Available PRN/intermittently (daughter lives very close) Type of Home: House Home Access: Stairs to enter Technical brewer of Steps: 4 Entrance Stairs-Rails: Left;Right Home Layout: One level     Bathroom Shower/Tub: Corporate investment banker: Standard     Home Equipment: None          Prior Functioning/Environment Level of Independence: Independent                      OT Goals(Current goals can be found in the care plan  section) Acute Rehab OT Goals Patient Stated Goal: to go home  OT Frequency:                End of Session Nurse Communication: Mobility status  Activity Tolerance: Patient tolerated treatment well Patient left: in chair;with call bell/phone within reach   Time: 0914-0922 OT Time Calculation (min): 8 min Charges:  OT General Charges $OT Visit: 1 Procedure OT Evaluation $OT Eval Low Complexity: 1 Procedure G-Codes:    Joy [name] D 08-31-15, 11:07 AM

## 2015-08-11 NOTE — Progress Notes (Signed)
DAILY PROGRESS NOTE  Subjective:  Diuresed well overnight - almost another 4L negative (total -10L). Weight is down 5lbs. Creatinine appears stable, electrolytes are stable. High HCT and PCO2 of 57, c/w severe COPD and chronic retention. Moderate pulmonary hypertension.  Objective:  Temp:  [98.4 F (36.9 C)-98.5 F (36.9 C)] 98.5 F (36.9 C) (05/25 0440) Pulse Rate:  [77-85] 77 (05/25 0440) Resp:  [16-20] 17 (05/25 0440) BP: (119-155)/(65-98) 124/70 mmHg (05/25 0440) SpO2:  [90 %-100 %] 100 % (05/25 0440) Weight:  [193 lb 1.6 oz (87.59 kg)] 193 lb 1.6 oz (87.59 kg) (05/25 0440) Weight change: -5 lb 8 oz (-2.495 kg)  Intake/Output from previous day: 05/24 0701 - 05/25 0700 In: 720 [P.O.:720] Out: 4500 [Urine:4500]  Intake/Output from this shift: Total I/O In: 120 [P.O.:120] Out: -   Medications: Current Facility-Administered Medications  Medication Dose Route Frequency Provider Last Rate Last Dose  . 0.9 %  sodium chloride infusion  250 mL Intravenous PRN [name], MD      . acetaminophen (TYLENOL) tablet 650 mg  650 mg Oral Q4H PRN [name], MD      . aspirin EC tablet 81 mg  81 mg Oral Daily [name], MD   81 mg at 08/10/15 1018  . enoxaparin (LOVENOX) injection 40 mg  40 mg Subcutaneous Q24H [name], MD   40 mg at 08/10/15 1936  . furosemide (LASIX) injection 40 mg  40 mg Intravenous BID Janora Norlander, DO   40 mg at 08/11/15 0808  . labetalol (NORMODYNE,TRANDATE) injection 10 mg  10 mg Intravenous Q2H PRN [name], MD      . lisinopril (PRINIVIL,ZESTRIL) tablet 5 mg  5 mg Oral Daily Arbutus Leas, NP   5 mg at 08/10/15 1824  . mometasone-formoterol (DULERA) 100-5 MCG/ACT inhaler 2 puff  2 puff Inhalation BID Asiyah Cletis Media, MD      . ondansetron (ZOFRAN) injection 4 mg  4 mg Intravenous Q6H PRN [name], MD      . pantoprazole (PROTONIX) EC tablet 20 mg  20 mg Oral Daily [name], MD   20 mg at 08/10/15  1018  . sodium chloride flush (NS) 0.9 % injection 3 mL  3 mL Intravenous Q12H [name], MD   3 mL at 08/10/15 1941  . sodium chloride flush (NS) 0.9 % injection 3 mL  3 mL Intravenous PRN [name], MD        Physical Exam: General appearance: alert and no distress Lungs: clear to auscultation bilaterally Heart: regular rate and rhythm Extremities: edema 2+ edema Neurologic: Grossly normal  Lab Results: Results for orders placed or performed during the hospital encounter of 08/08/15 (from the past 48 hour(s))  Basic metabolic panel     Status: Abnormal   Collection Time: 08/10/15  4:29 AM  Result Value Ref Range   Sodium 140 135 - 145 mmol/L   Potassium 3.6 3.5 - 5.1 mmol/L   Chloride 93 (L) 101 - 111 mmol/L   CO2 38 (H) 22 - 32 mmol/L   Glucose, Bld 92 65 - 99 mg/dL   BUN 6 6 - 20 mg/dL   Creatinine, Ser 0.73 0.44 - 1.00 mg/dL   Calcium 9.0 8.9 - 10.3 mg/dL   GFR calc non Af Amer >60 >60 mL/min   GFR calc Af Amer >60 >60 mL/min    Comment: (NOTE) The eGFR has been calculated using  the CKD EPI equation. This calculation has not been validated in all clinical situations. eGFR's persistently <60 mL/min signify possible Chronic Kidney Disease.    Anion gap 9 5 - 15  HIV antibody     Status: None   Collection Time: 08/10/15  9:42 AM  Result Value Ref Range   HIV Screen 4th Generation wRfx Non Reactive Non Reactive    Comment: (NOTE) Performed At: [facility] [place], [place] [id number] Lindon Romp MD [id number]   Rheumatoid factor     Status: Abnormal   Collection Time: 08/10/15  9:42 AM  Result Value Ref Range   Rhuematoid fact SerPl-aCnc 47.6 (H) 0.0 - 13.9 IU/mL    Comment: (NOTE) Performed At: [facility] [place], [place] [id number] Lindon Romp MD [id number]   Folate     Status: None   Collection Time: 08/10/15 11:40 AM  Result Value Ref Range   Folate 9.4 >5.9 ng/mL  Vitamin B12      Status: None   Collection Time: 08/10/15 11:48 AM  Result Value Ref Range   Vitamin B-12 511 180 - 914 pg/mL    Comment: (NOTE) This assay is not validated for testing neonatal or myeloproliferative syndrome specimens for Vitamin B12 levels.   Iron and TIBC     Status: Abnormal   Collection Time: 08/10/15 11:48 AM  Result Value Ref Range   Iron 158 28 - 170 ug/dL   TIBC 542 (H) 250 - 450 ug/dL   Saturation Ratios 29 10.4 - 31.8 %   UIBC 384 ug/dL  Ferritin     Status: None   Collection Time: 08/10/15 11:48 AM  Result Value Ref Range   Ferritin 15 11 - 307 ng/mL  Reticulocytes     Status: Abnormal   Collection Time: 08/10/15 11:48 AM  Result Value Ref Range   Retic Ct Pct 1.2 0.4 - 3.1 %   RBC. 6.02 (H) 3.87 - 5.11 MIL/uL   Retic Count, Manual 72.2 19.0 - 186.0 K/uL  Sedimentation rate     Status: None   Collection Time: 08/10/15 11:48 AM  Result Value Ref Range   Sed Rate 0 0 - 22 mm/hr  Blood gas, arterial     Status: Abnormal   Collection Time: 08/10/15  1:40 PM  Result Value Ref Range   FIO2 0.21    Delivery systems ROOM AIR    pH, Arterial 7.436 7.350 - 7.450   pCO2 arterial 57.3 (HH) 35.0 - 45.0 mmHg    Comment: CRITICAL RESULT CALLED TO, READ BACK BY AND VERIFIED WITH: S.LEWIS,RN @ 1347 BY J.HOPPER,RRT ON 08/10/15    pO2, Arterial 53.2 (L) 80.0 - 100.0 mmHg   Bicarbonate 37.9 (H) 20.0 - 24.0 mEq/L   TCO2 39.6 0 - 100 mmol/L   Acid-Base Excess 12.9 (H) 0.0 - 2.0 mmol/L   O2 Saturation 85.5 %   Patient temperature 98.6    Collection site LEFT RADIAL    Drawn by [id number]    Sample type ARTERIAL    Allens test (pass/fail) PASS PASS  Basic metabolic panel     Status: Abnormal   Collection Time: 08/11/15  6:46 AM  Result Value Ref Range   Sodium 141 135 - 145 mmol/L   Potassium 4.0 3.5 - 5.1 mmol/L   Chloride 92 (L) 101 - 111 mmol/L   CO2 42 (H) 22 - 32 mmol/L   Glucose, Bld 92 65 - 99 mg/dL   BUN  6 6 - 20 mg/dL   Creatinine, Ser 0.81 0.44 - 1.00 mg/dL    Calcium 9.4 8.9 - 10.3 mg/dL   GFR calc non Af Amer >60 >60 mL/min   GFR calc Af Amer >60 >60 mL/min    Comment: (NOTE) The eGFR has been calculated using the CKD EPI equation. This calculation has not been validated in all clinical situations. eGFR's persistently <60 mL/min signify possible Chronic Kidney Disease.    Anion gap 7 5 - 15  CBC     Status: Abnormal   Collection Time: 08/11/15  6:46 AM  Result Value Ref Range   WBC 5.0 4.0 - 10.5 K/uL   RBC 5.92 (H) 3.87 - 5.11 MIL/uL   Hemoglobin 15.5 (H) 12.0 - 15.0 g/dL   HCT 50.9 (H) 36.0 - 46.0 %   MCV 86.0 78.0 - 100.0 fL   MCH 26.2 26.0 - 34.0 pg   MCHC 30.5 30.0 - 36.0 g/dL   RDW 13.8 11.5 - 15.5 %   Platelets 227 150 - 400 K/uL    Imaging: Ct Angio Chest Pe W/cm &/or Wo Cm  08/10/2015  CLINICAL DATA:  Shortness of breath starting Monday, bilateral leg swelling EXAM: CT ANGIOGRAPHY CHEST WITH CONTRAST TECHNIQUE: Multidetector CT imaging of the chest was performed using the standard protocol during bolus administration of intravenous contrast. Multiplanar CT image reconstructions and MIPs were obtained to evaluate the vascular anatomy. CONTRAST:  100 cc Isovue COMPARISON:  01/12/2014 FINDINGS: Mediastinum/Lymph Nodes: Images of the thoracic inlet are unremarkable. Central airways are patent. Cardiomegaly. The study is of excellent technical quality. No pulmonary embolus is noted. Again noted prominent size pulmonary artery without change from prior exam. Lungs/Pleura: Images of the lung parenchyma shows no acute infiltrate or pulmonary edema. Axial image 67 lung windows there is stable 4 mm pleural based nodule in left lower lobe. Axial image 48 stable 3 mm nodule in lingula. Upper abdomen: The visualized upper abdomen shows mild fatty infiltration of the liver. No adrenal gland mass is noted. Musculoskeletal: Sagittal images of the spine shows no destructive bony lesions. Mild degenerative changes thoracic spine. Sagittal view of the  sternum is unremarkable. Review of the MIP images confirms the above findings. IMPRESSION: 1. No pulmonary embolus is noted. 2. No acute infiltrate or pulmonary edema. 3. Stable 4 mm pleural based nodule in left lower lobe. Given low risk and stability from 2015 no follow-up is necessary unless clinically indicated. 4. Osteopenia and mild degenerative changes thoracic spine. Electronically Signed   By: Lahoma Crocker M.D.   On: 08/10/2015 13:34    Assessment:  1. Active Problems: 2.   CHF (congestive heart failure) ([place]) 3.   Heart failure ([place]) 4.   Acute congestive heart failure ([place]) 5.   Right heart failure ([place]) 6.   COPD (chronic obstructive pulmonary disease) ([place]) 7.   Acute dyspnea 8.   Pulmonary hypertension ([place]) 9.   Plan:  1. Continue IV diuresis today - still has more room to go. Consider outpatient sleep study at some point.  Time Spent Directly with Patient:  15 minutes  Length of Stay:  LOS: 2 days   Pixie Casino, MD, [facility] Attending Cardiologist [place] 08/11/2015, 8:53 AM

## 2015-08-11 NOTE — Progress Notes (Signed)
Family Medicine Teaching Service Daily Progress Note Intern Pager: [phone]  Patient name: Joy [name] Date of birth: 01/22/1961 Age: 55 y.o. Gender: female  Primary Care Provider: No PCP Per Patient Consultants: Cards  Code Status: DNI  Pt Overview and Major Events to Date:  5/22: admitted for dyspnea and Leg swelling  Assessment and Plan: Joy [name] is a 55 y.o. female admitted with Dyspnea and Lower Extremity Swelling. PMH is significant for GERD, Iron Deficiency Anemia in the past.   #HFpEF Acute, RHF 2/2 PulmHTN:  Sxs > 46mo EF 60% to 65%. Right ventricle mildly dilated. Systolic function was mildly reduced. Right atrium moderately to severely dilated. Systolic Pulmonary arteries pressure severely increased. PA peak pressure: 72 mm Hg (S).  Admission CXR with cardiomegaly, pulmonary vascular congestion / interstitial edema. Marked volume overload exam on admission. - IV Lasix 442mBID .  - Per cards, continue diuresing, likely until Monday. Potential RHC, but could likely do it O/P.  - Avoid BB given potential lung disease  - ASA 8161m- Daily BMET.   - Monitor I/Os  # Pulmonary HTN. Pulmonary arteries: Systolic pressure was severely increased. PA  peak pressure: 72 mm Hg (S). PHTN Group 1,3,or 4 most likely. CTA as below. DDx includes chronic lung disease with hypoxemia vs. OSA/PHS vs. Interstitial lung disease vs. Sarcoidosis. Sarcoidosis less likely given normal Ca & alk phosph. CXR showed Pulmonary vascular congestion without edema or effusion and COPD but did report on ground-glass opacity.  Hgb 15.5 and elevated RBC 5.0, anticipate this will continue to raise as we diurese her. Likely 2/2 chronic hypoxia. CO2 to 42 today - RF 47.6. ANA negative.  - F/u SpO2 with exercise  - HIV nonreactive - ESR 0.  - ABGs showed a respiratory acidosis with a concomitant metabolic  - Consider right heart cath once euvolemic - Would benefit from PFTs and  polysomnography when euvolemic/OP   #COPD. No previous dx of COPD. 25 years smoking history. CXR reports Pulmonary hyperinflation and impression suggestive of COPD.  ABGs showed severe CO2 retention with pCO2 of 57.3 and pO2 51.3 and an acid-base excess of 12.9. SpO2 of 92-100 on RA. - Smoking cessation: patient feels that she wants to cut down. Encouraged pointing out that she'd already gone 3 days without cigarettes while in the hospital.  - PFTs O/P  # HTN: On admission, likely 2/2 Volume overload. Has previously "been told" she had HTN. Normotensive today with BPs 124/70 - Labetalol prn SBP > 160.  - Continue diuresing.   # GERD  - protonix here  # Pre-DM. A1c of 6.4. - Lifestyle & diet modifications. Consider starting metformin as O/P  # Iron Deficiency Anemia - Hemoglobin 15.5  here. MCV 86. Elevated RBC. Hgb and hematocrit  - TIBC 542 otherwise wnl - Continue to trend CBCs  FEN/GI: Heart Healthy Prophylaxis: Lovenox.   Disposition: Pending further workup.   Subjective:  Feels ok. Endorses leg pain and swelling. Denies CP, SOB, PND, orthopnea, HA or Nausea. FriMatilde Spranguld be here afternoon- that's who she'd like to have education and conversations about treatment with.  Objective: Temp:  [98.4 F (36.9 C)-98.5 F (36.9 C)] 98.5 F (36.9 C) (05/25 0440) Pulse Rate:  [77-85] 77 (05/25 0440) Resp:  [16-20] 17 (05/25 0440) BP: (119-155)/(65-98) 124/70 mmHg (05/25 0440) SpO2:  [90 %-100 %] 100 % (05/25 0440) Weight:  [87.59 kg (193 lb 1.6 oz)] 87.59 kg (193 lb 1.6 oz) (  05/25 0440)   Physical Exam: General: NAD, sitting on the side fo bed with legs dangling ENTM: Nares patent, O/P clear without erythema Neck: Did not appreciate JVD. Neck was supple Cardiovascular: RRR, no MGR, normal S1/S2.   Respiratory: CTA. No Bibasilar crackles. Normal expiratory phase (not prolonged), no wheezes.    Abdomen: Obese, distended, S, Nontender, Unable to palpate liver / spleen due  to habitus. Hepatojugular reflexnot appreciated. No abdominal edema appreciated MSK: 3+ edemaabout 2/3 up her shins bilaterally Skin: no erythema of LE, no skin breakdown Neuro: Grossly intact.  Psych: Somewhat flat affect, otherwise appropriate.   Laboratory:  Recent Labs Lab 08/08/15 1234  WBC 5.4  HGB 14.2  HCT 47.7*  PLT 237    Recent Labs Lab 08/08/15 1234 08/09/15 0613 08/10/15 0429  NA 142 142 140  K 3.7 4.0 3.6  CL 102 98* 93*  CO2 35* 37* 38*  BUN 5* 7 6  CREATININE 0.79 0.75 0.73  CALCIUM 9.2 8.8* 9.0  PROT 5.5*  --   --   BILITOT 0.7  --   --   ALKPHOS 68  --   --   ALT 27  --   --   AST 30  --   --   GLUCOSE 90 106* 92    Imaging/Diagnostic Tests: CTA IMPRESSION: 1. No pulmonary embolus is noted. 2. No acute infiltrate or pulmonary edema. 3. Stable 4 mm pleural based nodule in left lower lobe. Given low risk and stability from 2015 no follow-up is necessary unless clinically indicated. 4. Osteopenia and mild degenerative changes thoracic spine.  Hershal Coria, Med Student 08/11/2015, 7:20 AM MS4, [place] Intern pager: [phone], text pages welcome   I agree with the above evaluation, assessment, and plan. Any correctional changes can be noted in [facility].   [name], MD Family Medicine Resident - PGY 2  S: No acute events overnight. Continues to improve  O:  Filed Vitals:   08/11/15 1011 08/11/15 1156  BP: 103/68 132/75  Pulse:  67  Temp:  98.3 F (36.8 C)  Resp:  17  Gen: NAD, AAOx3 CV: RRR, no MGR, S1/S2, JVD 5cm Resp: CTAB, appropriate rate, unlabored Abd: S, NT, ND, +Bs Ext: 2+ edema BL to the mid-leg  A/P: 55 y/o F here with RHF 2/2 Severe range pulm HTN unclear etiology but most likely COPD at this time. Additionally, diastolic LHF.  - Continue diuresis for now.  - Add low dose ACE I.  - RHC here or as an outpatient - outpatient PFTs, Sleep study.  - Autoimmune workup unrevealing. CT without  significant parenchymal lung disease, no evidence of PE.  - Smoking cessation.  - Here mostly for continued diuresis.  - Appreciate cards.

## 2015-08-12 DIAGNOSIS — E876 Hypokalemia: Secondary | ICD-10-CM

## 2015-08-12 DIAGNOSIS — J438 Other emphysema: Secondary | ICD-10-CM

## 2015-08-12 DIAGNOSIS — I5031 Acute diastolic (congestive) heart failure: Secondary | ICD-10-CM

## 2015-08-12 LAB — CBC
HCT: 49.3 % — ABNORMAL HIGH (ref 36.0–46.0)
Hemoglobin: 14.9 g/dL (ref 12.0–15.0)
MCH: 25.7 pg — ABNORMAL LOW (ref 26.0–34.0)
MCHC: 30.2 g/dL (ref 30.0–36.0)
MCV: 85 fL (ref 78.0–100.0)
PLATELETS: 241 10*3/uL (ref 150–400)
RBC: 5.8 MIL/uL — AB (ref 3.87–5.11)
RDW: 13.9 % (ref 11.5–15.5)
WBC: 5.8 10*3/uL (ref 4.0–10.5)

## 2015-08-12 LAB — BASIC METABOLIC PANEL
ANION GAP: 10 (ref 5–15)
BUN: 9 mg/dL (ref 6–20)
CO2: 37 mmol/L — ABNORMAL HIGH (ref 22–32)
CREATININE: 0.83 mg/dL (ref 0.44–1.00)
Calcium: 9.2 mg/dL (ref 8.9–10.3)
Chloride: 92 mmol/L — ABNORMAL LOW (ref 101–111)
GFR calc non Af Amer: >60 mL/min (ref >60)
Glucose, Bld: 91 mg/dL (ref 65–99)
POTASSIUM: 3.3 mmol/L — AB (ref 3.5–5.1)
SODIUM: 139 mmol/L (ref 135–145)

## 2015-08-12 LAB — MAGNESIUM: Magnesium: 2 mg/dL (ref 1.7–2.4)

## 2015-08-12 MED ORDER — POTASSIUM CHLORIDE CRYS ER 20 MEQ PO TBCR
40.0000 meq | EXTENDED_RELEASE_TABLET | Freq: Once | ORAL | Status: AC
  Administered 2015-08-12: 40 meq via ORAL
  Filled 2015-08-12: qty 2

## 2015-08-12 MED ORDER — IPRATROPIUM BROMIDE 0.02 % IN SOLN
2.5000 mL | Freq: Four times a day (QID) | RESPIRATORY_TRACT | Status: DC
  Administered 2015-08-12 (×2): 0.5 mg via RESPIRATORY_TRACT
  Filled 2015-08-12 (×2): qty 2.5

## 2015-08-12 MED ORDER — IPRATROPIUM BROMIDE 0.02 % IN SOLN
2.5000 mL | Freq: Three times a day (TID) | RESPIRATORY_TRACT | Status: DC
  Administered 2015-08-13: 0.5 mg via RESPIRATORY_TRACT
  Filled 2015-08-12: qty 2.5

## 2015-08-12 NOTE — Progress Notes (Signed)
Family Medicine Teaching Service Daily Progress Note Intern Pager: [phone]  Patient name: Joy [name] record number: [id number] Date of birth: 1961/01/02 Age: 55 y.o. Gender: female  Primary Care Provider: No PCP Per Patient Consultants: Cards  Code Status: DNI  Pt Overview and Major Events to Date:  5/22: admitted for dyspnea and Leg swelling  Assessment and Plan: Joy [name] is a 55 y.o. female admitted with Dyspnea and Lower Extremity Swelling. PMH is significant for GERD, Iron Deficiency Anemia in the past.   #HFpEF Acute, RHF 2/2 PulmHTN:  Sxs > 12mo EF 60% to 65%. Right ventricle mildly dilated. Systolic function was mildly reduced. Right atrium moderately to severely dilated. Systolic Pulmonary arteries pressure severely increased. PA peak pressure: 72 mm Hg (S).  Admission CXR with cardiomegaly, pulmonary vascular congestion / interstitial edema. Marked volume overload exam on admission. Uo= 14.2L since admission on 5/22 - IV Lasix 430mBID .  - Per cards, continue diuresing, likely until Monday. Potential RHC, but could likely do it O/P.   - Avoid BB given potential lung disease  - ASA 8118m- Daily BMET.   - Monitor I/Os  # Pulmonary HTN. Pulmonary arteries: Systolic pressure was severely increased. PA  peak pressure: 72 mm Hg (S). PHTN Group 1,3,or 4 most likely. CTA as below. DDx includes chronic lung disease with hypoxemia vs. OSA/PHS vs. Interstitial lung disease vs. Sarcoidosis. Sarcoidosis less likely given normal Ca & alk phosph. CXR showed Pulmonary vascular congestion without edema or effusion and COPD but did report on ground-glass opacity.  CO2 down to 37 today - F/u SpO2 with exercise  - Continue diuresing - ABGs showed a respiratory acidosis with a concomitant metabolic  - Consider right heart cath once euvolemic - Would benefit from PFTs and polysomnography when euvolemic/OP   #COPD. No previous dx of COPD. 25 years smoking history. CXR reports  Pulmonary hyperinflation and impression suggestive of COPD.  ABGs showed severe CO2 retention with pCO2 of 57.3 and pO2 51.3 and an acid-base excess of 12.9. SpO2 of 97-100 on RA. - Smoking cessation addressed - Consider adding combivent  - PFTs O/P  # HTN: On admission, likely 2/2 Volume overload. Has previously "been told" she had HTN. Normotensive today with BPs 117/77 - Labetalol prn SBP > 160.  - Continue diuresing.   # GERD  - protonix here  # Pre-DM. A1c of 6.4. - Lifestyle & diet modifications. Consider starting metformin as O/P  # Iron Deficiency Anemia - Hemoglobin 14.9  here. MCV 86. Elevated RBC. Hgb and hematocrit  - Continue to trend CBCs  FEN/GI: Heart Healthy Prophylaxis: Lovenox.   Disposition: Pending further workup.   Subjective:  Feels ok. Denies leg pain and swelling. Denies CP, SOB, PND, orthopnea, HA or Nausea.    Objective: Temp:  [98.3 F (36.8 C)-98.9 F (37.2 C)] 98.5 F (36.9 C) (05/26 0603) Pulse Rate:  [67-84] 83 (05/26 0603) Resp:  [17-18] 18 (05/26 0603) BP: (103-132)/(68-77) 117/77 mmHg (05/26 0603) SpO2:  [97 %-100 %] 97 % (05/26 0603) Weight:  [85.821 kg (189 lb 3.2 oz)] 85.821 kg (189 lb 3.2 oz) (05/26 0603)   Physical Exam: General: NAD, sitting on the chair with legs dangling ENTM: Nares patent, O/P clear without erythema Neck: Did not appreciate JVD. Neck was supple Cardiovascular: RRR, no MGR, normal S1/S2.   Respiratory: CTA. No Bibasilar crackles. Normal expiratory phase (not prolonged), no wheezes.   Expiratory ronchi throughout. Abdomen: Obese, distended, S, Nontender, Unable  to palpate liver / spleen due to habitus. No abdominal edema appreciated MSK: 1+ edema midway up her shins bilaterally. Also pitting and non-pitting edema no feet bilaterally.  Skin: no erythema of LE, no skin breakdown Neuro: Grossly intact.  Psych: Somewhat flat affect, otherwise appropriate.   Laboratory:  Recent Labs Lab 08/08/15 1234  08/11/15 0646 08/12/15 0314  WBC 5.4 5.0 5.8  HGB 14.2 15.5* 14.9  HCT 47.7* 50.9* 49.3*  PLT 237 227 241    Recent Labs Lab 08/08/15 1234  08/10/15 0429 08/11/15 0646 08/12/15 0314  NA 142  < > 140 141 139  K 3.7  < > 3.6 4.0 3.3*  CL 102  < > 93* 92* 92*  CO2 35*  < > 38* 42* 37*  BUN 5*  < > _0 CREATININE 0.79  < > 0.73 0.81 0.83  CALCIUM 9.2  < > 9.0 9.4 9.2  PROT 5.5*  --   --   --   --   BILITOT 0.7  --   --   --   --   ALKPHOS 68  --   --   --   --   ALT 27  --   --   --   --   AST 30  --   --   --   --   GLUCOSE 90  < > 92 92 91  < > = values in this interval not displayed.  Imaging/Diagnostic Tests: No new images   Hershal Coria, Med Student 08/12/2015, 8:10 AM MS4, [place] Intern pager: [phone], text pages welcome  I agree with the above evaluation, assessment, and plan. Any correctional changes can be noted in [facility].   [name], MD Family Medicine Resident - PGY 2  S: continues to feel well. Diuresing well.   [name] Vitals:   08/11/15 2100 08/12/15 0603  BP: 117/74 117/77  Pulse: 84 83  Temp: 98.9 F (37.2 C) 98.5 F (36.9 C)  Resp: 18 18   Gen: nad, aaox3 CV: RRR, no MGR, 2+ distal pulses. JVD normal.  Resp: CTA B, appropriate rate, unlabored Abd: obese, soft,nt, nd Ext: 2+ edema to the mid-le, MAEW Skin: no rashes, no lesions.   A/P: 55 y/o F with RHF 2/2 Pulm HTN, Acute on Chronic diastolic HF, improved anasarca, likely new COPD diagnosis.  - lisinopril added, BP's excellent - continue IV diuresis overnight. Likely transition to PO tomorrow or Sunday.  - may be ok to go with RHC as outpatient if not needing to stay past Sunday.  - Needs f/u for pft's, sleep study.  - Planning to see [facility] - Discussed weight monitoring as an outpatient this am.

## 2015-08-12 NOTE — Progress Notes (Signed)
DAILY PROGRESS NOTE  Subjective:  Net -13L negative. Creatinine stable - K+ low this morning. Breathing better, less leg edema.  Objective:  Temp:  [98.3 F (36.8 C)-98.9 F (37.2 C)] 98.5 F (36.9 C) (05/26 0603) Pulse Rate:  [67-84] 83 (05/26 0603) Resp:  [17-18] 18 (05/26 0603) BP: (103-132)/(68-77) 117/77 mmHg (05/26 0603) SpO2:  [97 %-100 %] 97 % (05/26 0603) Weight:  [189 lb 3.2 oz (85.821 kg)] 189 lb 3.2 oz (85.821 kg) (05/26 0603) Weight change: -3 lb 14.4 oz (-1.769 kg)  Intake/Output from previous day: 05/25 0701 - 05/26 0700 In: 882 [P.O.:882] Out: 3925 [Urine:3925]  Intake/Output from this shift: Total I/O In: 480 [P.O.:480] Out: 400 [Urine:400]  Medications: Current Facility-Administered Medications  Medication Dose Route Frequency Provider Last Rate Last Dose  . 0.9 %  sodium chloride infusion  250 mL Intravenous PRN [name], MD      . acetaminophen (TYLENOL) tablet 650 mg  650 mg Oral Q4H PRN [name], MD      . aspirin EC tablet 81 mg  81 mg Oral Daily [name], MD   81 mg at 08/11/15 1012  . enoxaparin (LOVENOX) injection 40 mg  40 mg Subcutaneous Q24H [name], MD   40 mg at 08/11/15 2121  . furosemide (LASIX) injection 40 mg  40 mg Intravenous BID Ashly M Gottschalk, DO   40 mg at 08/12/15 4332  . labetalol (NORMODYNE,TRANDATE) injection 10 mg  10 mg Intravenous Q2H PRN [name], MD      . lisinopril (PRINIVIL,ZESTRIL) tablet 5 mg  5 mg Oral Daily Arbutus Leas, NP   5 mg at 08/11/15 1011  . mometasone-formoterol (DULERA) 100-5 MCG/ACT inhaler 2 puff  2 puff Inhalation BID Asiyah Cletis Media, MD   2 puff at 08/11/15 2012  . ondansetron (ZOFRAN) injection 4 mg  4 mg Intravenous Q6H PRN York Ram Melancon, MD      . pantoprazole (PROTONIX) EC tablet 20 mg  20 mg Oral Daily [name], MD   20 mg at 08/11/15 1011  . sodium chloride flush (NS) 0.9 % injection 3 mL  3 mL Intravenous Q12H [name], MD   3  mL at 08/11/15 2200  . sodium chloride flush (NS) 0.9 % injection 3 mL  3 mL Intravenous PRN [name], MD        Physical Exam: General appearance: alert and no distress Lungs: clear to auscultation bilaterally Heart: regular rate and rhythm Extremities: edema 1+ Neurologic: Grossly normal  Lab Results: Results for orders placed or performed during the hospital encounter of 08/08/15 (from the past 48 hour(s))  HIV antibody     Status: None   Collection Time: 08/10/15  9:42 AM  Result Value Ref Range   HIV Screen 4th Generation wRfx Non Reactive Non Reactive    Comment: (NOTE) Performed At: [facility] [address] [place], [place] [id number] Lindon Romp MD AY:[id number]   Antinuclear Antibodies, IFA     Status: None   Collection Time: 08/10/15  9:42 AM  Result Value Ref Range   ANA Ab, IFA Negative     Comment: (NOTE)                                     Negative   <1:80  Borderline  1:80                                     Positive   >1:80 Performed At: [facility] [place], [place] [id number] Lindon Romp MD OI:[id number]   Rheumatoid factor     Status: Abnormal   Collection Time: 08/10/15  9:42 AM  Result Value Ref Range   Rhuematoid fact SerPl-aCnc 47.6 (H) 0.0 - 13.9 IU/mL    Comment: (NOTE) Performed At: [facility] [place], [place] [id number] Lindon Romp MD JQ:[id number]   Folate     Status: None   Collection Time: 08/10/15 11:40 AM  Result Value Ref Range   Folate 9.4 >5.9 ng/mL  Vitamin B12     Status: None   Collection Time: 08/10/15 11:48 AM  Result Value Ref Range   Vitamin B-12 511 180 - 914 pg/mL    Comment: (NOTE) This assay is not validated for testing neonatal or myeloproliferative syndrome specimens for Vitamin B12 levels.   Iron and TIBC     Status: Abnormal   Collection Time: 08/10/15 11:48 AM  Result Value Ref Range   Iron 158  28 - 170 ug/dL   TIBC 542 (H) 250 - 450 ug/dL   Saturation Ratios 29 10.4 - 31.8 %   UIBC 384 ug/dL  Ferritin     Status: None   Collection Time: 08/10/15 11:48 AM  Result Value Ref Range   Ferritin 15 11 - 307 ng/mL  Reticulocytes     Status: Abnormal   Collection Time: 08/10/15 11:48 AM  Result Value Ref Range   Retic Ct Pct 1.2 0.4 - 3.1 %   RBC. 6.02 (H) 3.87 - 5.11 MIL/uL   Retic Count, Manual 72.2 19.0 - 186.0 K/uL  Sedimentation rate     Status: None   Collection Time: 08/10/15 11:48 AM  Result Value Ref Range   Sed Rate 0 0 - 22 mm/hr  Blood gas, arterial     Status: Abnormal   Collection Time: 08/10/15  1:40 PM  Result Value Ref Range   FIO2 0.21    Delivery systems ROOM AIR    pH, Arterial 7.436 7.350 - 7.450   pCO2 arterial 57.3 (HH) 35.0 - 45.0 mmHg    Comment: CRITICAL RESULT CALLED TO, READ BACK BY AND VERIFIED WITH: S.LEWIS,RN @ 1347 BY J.HOPPER,RRT ON 08/10/15    pO2, Arterial 53.2 (L) 80.0 - 100.0 mmHg   Bicarbonate 37.9 (H) 20.0 - 24.0 mEq/L   TCO2 39.6 0 - 100 mmol/L   Acid-Base Excess 12.9 (H) 0.0 - 2.0 mmol/L   O2 Saturation 85.5 %   Patient temperature 98.6    Collection site LEFT RADIAL    Drawn by [id number]    Sample type ARTERIAL    Allens test (pass/fail) PASS PASS  Basic metabolic panel     Status: Abnormal   Collection Time: 08/11/15  6:46 AM  Result Value Ref Range   Sodium 141 135 - 145 mmol/L   Potassium 4.0 3.5 - 5.1 mmol/L   Chloride 92 (L) 101 - 111 mmol/L   CO2 42 (H) 22 - 32 mmol/L   Glucose, Bld 92 65 - 99 mg/dL   BUN 6 6 - 20 mg/dL   Creatinine, Ser 0.81 0.44 - 1.00 mg/dL   Calcium 9.4 8.9 - 10.3 mg/dL   GFR calc non  Af Amer >60 >60 mL/min   GFR calc Af Amer >60 >60 mL/min    Comment: (NOTE) The eGFR has been calculated using the CKD EPI equation. This calculation has not been validated in all clinical situations. eGFR's persistently <60 mL/min signify possible Chronic Kidney Disease.    Anion gap 7 5 - 15  CBC     Status:  Abnormal   Collection Time: 08/11/15  6:46 AM  Result Value Ref Range   WBC 5.0 4.0 - 10.5 K/uL   RBC 5.92 (H) 3.87 - 5.11 MIL/uL   Hemoglobin 15.5 (H) 12.0 - 15.0 g/dL   HCT 50.9 (H) 36.0 - 46.0 %   MCV 86.0 78.0 - 100.0 fL   MCH 26.2 26.0 - 34.0 pg   MCHC 30.5 30.0 - 36.0 g/dL   RDW 13.8 11.5 - 15.5 %   Platelets 227 150 - 400 K/uL  Basic metabolic panel     Status: Abnormal   Collection Time: 08/12/15  3:14 AM  Result Value Ref Range   Sodium 139 135 - 145 mmol/L   Potassium 3.3 (L) 3.5 - 5.1 mmol/L    Comment: DELTA CHECK NOTED   Chloride 92 (L) 101 - 111 mmol/L   CO2 37 (H) 22 - 32 mmol/L   Glucose, Bld 91 65 - 99 mg/dL   BUN 9 6 - 20 mg/dL   Creatinine, Ser 0.83 0.44 - 1.00 mg/dL   Calcium 9.2 8.9 - 10.3 mg/dL   GFR calc non Af Amer >60 >60 mL/min   GFR calc Af Amer >60 >60 mL/min    Comment: (NOTE) The eGFR has been calculated using the CKD EPI equation. This calculation has not been validated in all clinical situations. eGFR's persistently <60 mL/min signify possible Chronic Kidney Disease.    Anion gap 10 5 - 15  CBC     Status: Abnormal   Collection Time: 08/12/15  3:14 AM  Result Value Ref Range   WBC 5.8 4.0 - 10.5 K/uL   RBC 5.80 (H) 3.87 - 5.11 MIL/uL   Hemoglobin 14.9 12.0 - 15.0 g/dL   HCT 49.3 (H) 36.0 - 46.0 %   MCV 85.0 78.0 - 100.0 fL   MCH 25.7 (L) 26.0 - 34.0 pg   MCHC 30.2 30.0 - 36.0 g/dL   RDW 13.9 11.5 - 15.5 %   Platelets 241 150 - 400 K/uL  Magnesium     Status: None   Collection Time: 08/12/15  3:14 AM  Result Value Ref Range   Magnesium 2.0 1.7 - 2.4 mg/dL    Imaging: Ct Angio Chest Pe W/cm &/or Wo Cm  08/10/2015  CLINICAL DATA:  Shortness of breath starting Monday, bilateral leg swelling EXAM: CT ANGIOGRAPHY CHEST WITH CONTRAST TECHNIQUE: Multidetector CT imaging of the chest was performed using the standard protocol during bolus administration of intravenous contrast. Multiplanar CT image reconstructions and MIPs were obtained to  evaluate the vascular anatomy. CONTRAST:  100 cc Isovue COMPARISON:  01/12/2014 FINDINGS: Mediastinum/Lymph Nodes: Images of the thoracic inlet are unremarkable. Central airways are patent. Cardiomegaly. The study is of excellent technical quality. No pulmonary embolus is noted. Again noted prominent size pulmonary artery without change from prior exam. Lungs/Pleura: Images of the lung parenchyma shows no acute infiltrate or pulmonary edema. Axial image 67 lung windows there is stable 4 mm pleural based nodule in left lower lobe. Axial image 48 stable 3 mm nodule in lingula. Upper abdomen: The visualized upper abdomen shows mild fatty infiltration  of the liver. No adrenal gland mass is noted. Musculoskeletal: Sagittal images of the spine shows no destructive bony lesions. Mild degenerative changes thoracic spine. Sagittal view of the sternum is unremarkable. Review of the MIP images confirms the above findings. IMPRESSION: 1. No pulmonary embolus is noted. 2. No acute infiltrate or pulmonary edema. 3. Stable 4 mm pleural based nodule in left lower lobe. Given low risk and stability from 2015 no follow-up is necessary unless clinically indicated. 4. Osteopenia and mild degenerative changes thoracic spine. Electronically Signed   By: Lahoma Crocker M.D.   On: 08/10/2015 13:34    Assessment:  Principal Problem:   Acute right heart failure (HCC) Active Problems:   CHF (congestive heart failure) (HCC)   Heart failure (HCC)   Acute congestive heart failure (HCC)   Right heart failure (HCC)   COPD (chronic obstructive pulmonary disease) (HCC)   Acute dyspnea   Pulmonary hypertension (HCC)   Acute diastolic congestive heart failure ([place])   Plan:  1. Continue IV diuresis. Could have Tucker as an outpatient. Replete potassium - I ordered 40 MEQ K+ today - added magnesium to labs tomorrow.  Time Spent Directly with Patient:  15 minutes  Length of Stay:  LOS: 3 days   Pixie Casino, MD, [facility] Attending  Cardiologist [place] 08/12/2015, 9:01 AM

## 2015-08-13 DIAGNOSIS — J439 Emphysema, unspecified: Secondary | ICD-10-CM

## 2015-08-13 LAB — BASIC METABOLIC PANEL
Anion gap: 9 (ref 5–15)
BUN: 9 mg/dL (ref 6–20)
CHLORIDE: 95 mmol/L — AB (ref 101–111)
CO2: 35 mmol/L — ABNORMAL HIGH (ref 22–32)
CREATININE: 0.73 mg/dL (ref 0.44–1.00)
Calcium: 9.3 mg/dL (ref 8.9–10.3)
GFR calc Af Amer: >60 mL/min (ref >60)
GFR calc non Af Amer: >60 mL/min (ref >60)
GLUCOSE: 97 mg/dL (ref 65–99)
Potassium: 3.8 mmol/L (ref 3.5–5.1)
SODIUM: 139 mmol/L (ref 135–145)

## 2015-08-13 LAB — MAGNESIUM: MAGNESIUM: 2 mg/dL (ref 1.7–2.4)

## 2015-08-13 MED ORDER — POTASSIUM CHLORIDE ER 10 MEQ PO TBCR: 10.0000 meq | EXTENDED_RELEASE_TABLET | Freq: Every day | ORAL | Status: DC

## 2015-08-13 MED ORDER — IPRATROPIUM BROMIDE HFA 17 MCG/ACT IN AERS: 2.0000 | INHALATION_SPRAY | RESPIRATORY_TRACT | Status: DC | PRN

## 2015-08-13 MED ORDER — LISINOPRIL 5 MG PO TABS: 5.0000 mg | ORAL_TABLET | Freq: Every day | ORAL | Status: DC

## 2015-08-13 MED ORDER — FUROSEMIDE 40 MG PO TABS
40.0000 mg | ORAL_TABLET | Freq: Two times a day (BID) | ORAL | Status: DC
  Administered 2015-08-13: 40 mg via ORAL
  Filled 2015-08-13: qty 1

## 2015-08-13 MED ORDER — IPRATROPIUM BROMIDE 0.02 % IN SOLN: 0.5000 mg | RESPIRATORY_TRACT | Status: DC | PRN

## 2015-08-13 MED ORDER — FUROSEMIDE 40 MG PO TABS: 40.0000 mg | ORAL_TABLET | Freq: Two times a day (BID) | ORAL | Status: DC

## 2015-08-13 NOTE — Discharge Instructions (Signed)
Thanks for letting us take care of you.   Be sure to follow up as an outpatient. Your appointment in 08/23/2015 at 11:00 am at [facility]. [place], [place].   Take your Lasix pill in the morning and at night.   Take your Lisinopril each morning   Take the Atrovent inhaler twice a day.   Get a scale to weigh yourself at home. If you gain more than 3 pounds in one day or 5 pounds in one week, then you need to call the [place] office for an earlier appointment.   If you have any questions or concerns, call the [facility] office and we will get in touch with you.   Thanks for letting us take care of you.   Sincerely, Paula Compton, MD Family Medicine -PGY 2

## 2015-08-13 NOTE — Progress Notes (Signed)
ReDS Vest Discharge Study  Results of ReDS reading  Your patient is in the Unblinded arm of the Vest at Discharge study.  The ReDS reading is:   ( < 39)  = 30  Your patient is ok for discharge.   Thank You   The research team

## 2015-08-13 NOTE — Progress Notes (Signed)
SUBJECTIVE: Denies shortness of breath. Leg swelling markedly improved. Wants to go home.   ROS: Other than pertinent positives in "Subjective", all others were reviewed and found to be negative.   Intake/Output Summary (Last 24 hours) at 08/13/15 0919 Last data filed at 08/13/15 0841  Gross per 24 hour  Intake   1260 ml  Output   2320 ml  Net  -1060 ml    Current Facility-Administered Medications  Medication Dose Route Frequency Provider Last Rate Last Dose  . 0.9 %  sodium chloride infusion  250 mL Intravenous PRN [name], MD      . acetaminophen (TYLENOL) tablet 650 mg  650 mg Oral Q4H PRN [name], MD      . aspirin EC tablet 81 mg  81 mg Oral Daily [name], MD   81 mg at 08/12/15 1020  . furosemide (LASIX) injection 40 mg  40 mg Intravenous BID Ashly M Gottschalk, DO   40 mg at 08/12/15 1800  . ipratropium (ATROVENT) nebulizer solution 0.5 mg  2.5 mL Inhalation TID Dickie La, MD      . labetalol (NORMODYNE,TRANDATE) injection 10 mg  10 mg Intravenous Q2H PRN [name], MD      . lisinopril (PRINIVIL,ZESTRIL) tablet 5 mg  5 mg Oral Daily Arbutus Leas, NP   5 mg at 08/12/15 1021  . ondansetron (ZOFRAN) injection 4 mg  4 mg Intravenous Q6H PRN York Ram Melancon, MD      . pantoprazole (PROTONIX) EC tablet 20 mg  20 mg Oral Daily [name], MD   20 mg at 08/12/15 1021  . sodium chloride flush (NS) 0.9 % injection 3 mL  3 mL Intravenous Q12H [name], MD   3 mL at 08/12/15 1000  . sodium chloride flush (NS) 0.9 % injection 3 mL  3 mL Intravenous PRN [name], MD        Filed Vitals:   08/12/15 1504 08/12/15 2008 08/12/15 2056 08/13/15 0418  BP:  114/76  124/72  Pulse:  78  80  Temp:  98.1 F (36.7 C)  98.3 F (36.8 C)  TempSrc:  Oral  Oral  Resp:  18  18  Height:      Weight:    186 lb 4.8 oz (84.505 kg)  SpO2: 94% 93% 94% 92%    PHYSICAL EXAM General: NAD HEENT: Normal. Neck: No JVD, no thyromegaly.    Lungs: Clear to auscultation bilaterally with normal respiratory effort. CV: Nondisplaced PMI.  Regular rate and rhythm, normal S1/S2, no S3/S4, no murmur.  1+ pitting pretibial edema.    Abdomen: Soft, obese, no distention.  Neurologic: Alert and oriented x 3.  Psych: Flat affect. Musculoskeletal: No gross deformities. Extremities: No clubbing or cyanosis.     LABS: Basic Metabolic Panel:  Recent Labs  08/12/15 0314 08/13/15 0352  NA 139 139  K 3.3* 3.8  CL 92* 95*  CO2 37* 35*  GLUCOSE 91 97  BUN 9 9  CREATININE 0.83 0.73  CALCIUM 9.2 9.3  MG 2.0 2.0   Liver Function Tests: No results for input(s): AST, ALT, ALKPHOS, BILITOT, PROT, ALBUMIN in the last 72 hours. No results for input(s): LIPASE, AMYLASE in the last 72 hours. CBC:  Recent Labs  08/11/15 0646 08/12/15 0314  WBC 5.0 5.8  HGB 15.5* 14.9  HCT 50.9* 49.3*  MCV 86.0 85.0  PLT 227 241  Cardiac Enzymes: No results for input(s): CKTOTAL, CKMB, CKMBINDEX, TROPONINI in the last 72 hours. BNP: Invalid input(s): POCBNP D-Dimer: No results for input(s): DDIMER in the last 72 hours. Hemoglobin A1C: No results for input(s): HGBA1C in the last 72 hours. Fasting Lipid Panel: No results for input(s): CHOL, HDL, LDLCALC, TRIG, CHOLHDL, LDLDIRECT in the last 72 hours. Thyroid Function Tests: No results for input(s): TSH, T4TOTAL, T3FREE, THYROIDAB in the last 72 hours.  Invalid input(s): FREET3 Anemia Panel:  Recent Labs  08/10/15 1140 08/10/15 1148  VITAMINB12  --  511  FOLATE 9.4  --   FERRITIN  --  15  TIBC  --  542*  IRON  --  158  RETICCTPCT  --  1.2    RADIOLOGY: Dg Chest 2 View  08/08/2015  CLINICAL DATA:  Bilateral leg swelling EXAM: CHEST  2 VIEW COMPARISON:  01/12/2014 FINDINGS: Cardiac enlargement with mild vascular congestion suggesting fluid overload. Negative for edema or effusion. Negative for pneumonia Pulmonary hyperinflation. IMPRESSION: Pulmonary vascular congestion without  edema or effusion COPD Electronically Signed   By: Franchot Gallo M.D.   On: 08/08/2015 13:13   Ct Angio Chest Pe W/cm &/or Wo Cm  08/10/2015  CLINICAL DATA:  Shortness of breath starting Monday, bilateral leg swelling EXAM: CT ANGIOGRAPHY CHEST WITH CONTRAST TECHNIQUE: Multidetector CT imaging of the chest was performed using the standard protocol during bolus administration of intravenous contrast. Multiplanar CT image reconstructions and MIPs were obtained to evaluate the vascular anatomy. CONTRAST:  100 cc Isovue COMPARISON:  01/12/2014 FINDINGS: Mediastinum/Lymph Nodes: Images of the thoracic inlet are unremarkable. Central airways are patent. Cardiomegaly. The study is of excellent technical quality. No pulmonary embolus is noted. Again noted prominent size pulmonary artery without change from prior exam. Lungs/Pleura: Images of the lung parenchyma shows no acute infiltrate or pulmonary edema. Axial image 67 lung windows there is stable 4 mm pleural based nodule in left lower lobe. Axial image 48 stable 3 mm nodule in lingula. Upper abdomen: The visualized upper abdomen shows mild fatty infiltration of the liver. No adrenal gland mass is noted. Musculoskeletal: Sagittal images of the spine shows no destructive bony lesions. Mild degenerative changes thoracic spine. Sagittal view of the sternum is unremarkable. Review of the MIP images confirms the above findings. IMPRESSION: 1. No pulmonary embolus is noted. 2. No acute infiltrate or pulmonary edema. 3. Stable 4 mm pleural based nodule in left lower lobe. Given low risk and stability from 2015 no follow-up is necessary unless clinically indicated. 4. Osteopenia and mild degenerative changes thoracic spine. Electronically Signed   By: Lahoma Crocker M.D.   On: 08/10/2015 13:34      ASSESSMENT AND PLAN:   1. Acute diastolic heart failure/right heart failure: Has diuresed well. Will switch to Lasix 40 mg daily. Will need KCl supplementation as outpatient.  Can consider outpatient right heart cath. Needs to stop smoking.  2. Moderate pulmonary hypertension: Secondary to COPD.   3. Essential HTN: Better controlled with addition of ACEI.  Dispo: Stable for discharge from my standpoint.    Kate Sable, M.D., F.A.C.C.

## 2015-08-13 NOTE — Progress Notes (Signed)
Family Medicine Teaching Service Daily Progress Note Intern Pager: [phone]  Patient name: Ravenel record number: [id number] Date of birth: [birth year] Age: 55 y.o. Gender: female  Primary Care Provider: No PCP Per Patient Consultants: Cards  Code Status: DNI  Pt Overview and Major Events to Date:  5/22: admitted for dyspnea and Leg swelling  Assessment and Plan: BABYGIRL TRAGER is a 55 y.o. female admitted with Dyspnea and Lower Extremity Swelling. PMH is significant for GERD, Iron Deficiency Anemia in the past.   #Cor Pulmonale:  Possible component of acute diastolic HF, Sxs > 31mo EF 60% to 65%. Right ventricle mildly dilated. Systolic function was mildly reduced. Right atrium moderately to severely dilated. Systolic Pulmonary arteries pressure severely increased. PA peak pressure: 72 mm Hg (S).  Admission CXR with cardiomegaly, pulmonary vascular congestion / interstitial edema. Weight down nearly 30 pounds. - Transition to PO lasix. 40 BID today.  - RHC as an outpatient.   - Lisinopril for BP control.  - ASA 82m - Daily BMET.   - Monitor I/Os  # Pulmonary HTN. Severe range, More likely Group 3. ABG with contraction alkalosis on chronically compensated respiratory acidosis.  - F/u SpO2 with exercise  - Continue diuresing. RHC likely o/p as above.  - Would benefit from PFTs and Sleep study as an outpatient.   #COPD. given smoking hx. No previous dx of COPD.  - Smoking cessation addressed - Atrovent added. Will likely go home on this until COPD further classified.  - PFTs O/P  # HTN: On admission, likely 2/2 Volume overload. Has previously "been told" she had HTN. Normotensive today with BPs 117/77 - Lisinopril 18m72maily.  # GERD  - protonix here  # Pre-DM. A1c of 6.4. - Lifestyle & diet modifications. Consider starting metformin as O/P  FEN/GI: Heart Healthy Prophylaxis: Lovenox.   Disposition: Pending further workup.   Subjective:  Feeling well this  morning.   Objective: Temp:  [98.1 F (36.7 C)-98.3 F (36.8 C)] 98.3 F (36.8 C) (05/27 0418) Pulse Rate:  [71-80] 80 (05/27 0418) Resp:  [18] 18 (05/27 0418) BP: (114-145)/(72-91) 124/72 mmHg (05/27 0418) SpO2:  [92 %-94 %] 92 % (05/27 0418) Weight:  [186 lb 4.8 oz (84.505 kg)] 186 lb 4.8 oz (84.505 kg) (05/27 0418)   Physical Exam: General: Sitting in chair CV: RRR, no MGR, normal S1/S2, JVD 3-4 cm Resp: CTA B, appropriate rate, unlabored.  Abd: S, NT, ND, +BS.  Ext: WWP, 1+ edema.   Laboratory:  Recent Labs Lab 08/08/15 1234 08/11/15 0646 08/12/15 0314  WBC 5.4 5.0 5.8  HGB 14.2 15.5* 14.9  HCT 47.7* 50.9* 49.3*  PLT 237 227 241    Recent Labs Lab 08/08/15 1234  08/11/15 0646 08/12/15 0314 08/13/15 0352  NA 142  < > 141 139 139  K 3.7  < > 4.0 3.3* 3.8  CL 102  < > 92* 92* 95*  CO2 35*  < > 42* 37* 35*  BUN 5*  < > _0 CREATININE 0.79  < > 0.81 0.83 0.73  CALCIUM 9.2  < > 9.4 9.2 9.3  PROT 5.5*  --   --   --   --   BILITOT 0.7  --   --   --   --   ALKPHOS 68  --   --   --   --   ALT 27  --   --   --   --  AST 30  --   --   --   --   GLUCOSE 90  < > 92 91 97  < > = values in this interval not displayed.  Imaging/Diagnostic Tests: No new images   [name], MD 08/13/2015, 9:09 AM PGY 2, [place] Intern pager: [phone], text pages welcome

## 2015-08-13 NOTE — Discharge Summary (Signed)
[facility] Discharge Summary  Patient name: Joy [name] record number: [id number] Date of birth: 10-22-1960 Age: 55 y.o. Gender: female Date of Admission: 08/08/2015  Date of Discharge: 08/13/2015 Admitting Physician: Dickie La, MD  Primary Care Provider: No PCP Per Patient Consultants: Cardiology  Indication for Hospitalization: Cor Pulmonale, Heart Failure  Discharge Diagnoses/Problem List:  Cor Pulmonale Likely COPD HTN  Disposition: home  Discharge Condition: stable  Discharge Exam:  See discharge progress note  Brief Hospital Course:  55 y/o F with no significant PMH except for 25pck/yr hx of tobacco abuse who presented with 1 month of SOB and leg swelling. Found to have anasarca. Subsequent evaluation revealed Mild right heart failure in the setting of severe Pulmonary HTN with mild left heart disease that would not otherwise explain her pulmonary htn. She required IV diuresis which she responded to very well. She was found to have HTN on admission, which was though to have possibly contributed to her mild left heart dysfunction. This was previously undiagnosed. She lost nearly 30lbs of fluid in the hospital. Her Dry weight is 185lbs, she was discharged on oral lasix and lisinopril to control her blood pressure with plan for close follow up. Cardiology saw her in the hospital and recommended right heart catheterization as an outpatient for further characterization of her pulmonary hypertension. Safe for discharge home with close follow up. She will need sleep study and PFTs as an outpatient. Smoking cessation discussed.   Issues for Follow Up:  1. F/U weight and compliance with lasix.  2. Needs f/u BMET 3. Cards follow up for RHC 4. PFT's, Sleep study as an outpatient.   Significant Procedures: none  Significant Labs and Imaging:   Recent Labs Lab 08/08/15 1234 08/11/15 0646 08/12/15 0314  WBC 5.4 5.0 5.8  HGB 14.2 15.5* 14.9   HCT 47.7* 50.9* 49.3*  PLT 237 227 241    Recent Labs Lab 08/08/15 1234 08/08/15 1931 08/09/15 0613 08/10/15 0429 08/11/15 0646 08/12/15 0314 08/13/15 0352  NA 142  --  142 140 141 139 139  K 3.7  --  4.0 3.6 4.0 3.3* 3.8  CL 102  --  98* 93* 92* 92* 95*  CO2 35*  --  37* 38* 42* 37* 35*  GLUCOSE 90  --  106* 92 92 91 97  BUN 5*  --  _0 CREATININE 0.79  --  0.75 0.73 0.81 0.83 0.73  CALCIUM 9.2  --  8.8* 9.0 9.4 9.2 9.3  MG  --  1.8  --   --   --  2.0 2.0  ALKPHOS 68  --   --   --   --   --   --   AST 30  --   --   --   --   --   --   ALT 27  --   --   --   --   --   --   ALBUMIN 3.3*  --   --   --   --   --   --       Results/Tests Pending at Time of Discharge: none  Discharge Medications:    Medication List    STOP taking these medications        amoxicillin-clavulanate 875-125 MG tablet  Commonly known as:  AUGMENTIN     ibuprofen 200 MG tablet  Commonly known as:  ADVIL,MOTRIN  TAKE these medications        furosemide 40 MG tablet  Commonly known as:  LASIX  Take 1 tablet (40 mg total) by mouth 2 (two) times daily.     ipratropium 17 MCG/ACT inhaler  Commonly known as:  ATROVENT HFA  Inhale 2 puffs into the lungs every 4 (four) hours as needed for wheezing.     lisinopril 5 MG tablet  Commonly known as:  PRINIVIL,ZESTRIL  Take 1 tablet (5 mg total) by mouth daily.     potassium chloride 10 MEQ tablet  Commonly known as:  K-DUR  Take 1 tablet (10 mEq total) by mouth daily.        Discharge Instructions: Please refer to Patient Instructions section of EMR for full details.  Patient was counseled important signs and symptoms that should prompt return to medical care, changes in medications, dietary instructions, activity restrictions, and follow up appointments.   Follow-Up Appointments: Follow-up Information    Follow up with Paula Compton, MD. Go on 08/23/2015.   Specialty:  Family Medicine   Why:  Hospital Follow Up - 11:00 am.  Arrive 15 minutes early    Contact information:   [address] [address] [phone]       [name], MD 08/13/2015, 1:43 PM PGY-2, [place]

## 2015-08-23 ENCOUNTER — Encounter: Payer: Self-pay | Admitting: Family Medicine

## 2015-08-23 ENCOUNTER — Ambulatory Visit (INDEPENDENT_AMBULATORY_CARE_PROVIDER_SITE_OTHER): Payer: Self-pay | Admitting: Family Medicine

## 2015-08-23 VITALS — BP 128/83 | HR 80 | Temp 98.3°F | Ht 67.0 in | Wt 184.0 lb

## 2015-08-23 DIAGNOSIS — Z7289 Other problems related to lifestyle: Secondary | ICD-10-CM

## 2015-08-23 DIAGNOSIS — I509 Heart failure, unspecified: Secondary | ICD-10-CM

## 2015-08-23 DIAGNOSIS — I50810 Right heart failure, unspecified: Secondary | ICD-10-CM

## 2015-08-23 DIAGNOSIS — Z609 Problem related to social environment, unspecified: Secondary | ICD-10-CM

## 2015-08-23 NOTE — Progress Notes (Signed)
Patient ID: Joy [name], female   DOB: [birth year], 55 y.o.   MRN: [id number]   [facility] [name], MD Phone: [phone]  Subjective:   # Hospital Follow Up - RHF, Cor Pulmonale - Pt. Her today for follow up.  - weight is on target at 184lbs., 185lbs at hospital d/c.  - She is taking the lasix twice daily, Lisinopril, and potassium as prescribed.  - She has purchased a scale and is weighing herself at home as well. Weight is stable.  - No shortness of breath, nochest pain, no palpitations.  - She is unsure if she had Cardiology f/u because she lost her phone.  - she is trying to cut back on smoking to 1/2 ppd from 1ppd.  - She would like to quit smoking completely.  - She is drinking ETOH, 24oz. 2 on the weekend, says she does not drink during the week.  - She is not working, and is not on disability.    All relevant systems were reviewed and were negative unless otherwise noted in the HPI  Past Medical History Reviewed problem list.  Medications- reviewed and updated Current Outpatient Prescriptions  Medication Sig Dispense Refill  . furosemide (LASIX) 40 MG tablet Take 1 tablet (40 mg total) by mouth 2 (two) times daily. 60 tablet 1  . ipratropium (ATROVENT HFA) 17 MCG/ACT inhaler Inhale 2 puffs into the lungs every 4 (four) hours as needed for wheezing. 1 Inhaler 12  . lisinopril (PRINIVIL,ZESTRIL) 5 MG tablet Take 1 tablet (5 mg total) by mouth daily. 30 tablet 2  . potassium chloride (K-DUR) 10 MEQ tablet Take 1 tablet (10 mEq total) by mouth daily. 30 tablet 0   No current facility-administered medications for this visit.   Chief complaint-noted No additions to family history Social history- patient is a current 1/2 ppd smoker  Objective: BP 128/83 mmHg  Pulse 80  Temp(Src) 98.3 F (36.8 C) (Oral)  Ht _0  (1.702 m)  Wt 184 lb (83.462 kg)  BMI 28.81 kg/m2 Gen: NAD, alert, cooperative with exam HEENT: NCAT, EOMI, PERRL Neck:  FROM, supple CV: RRR, good S1/S2, no murmur Resp: CTABL, no wheezes, non-labored Abd: SNTND, BS present, no guarding or organomegaly Ext: No edema, warm, normal tone, moves UE/LE spontaneously Neuro: Alert and oriented, No gross deficits Skin: no rashes no lesions  Assessment/Plan:  Right heart failure (HCC) Pt. With cor pulmonale on Echo during recent hospitalization, fluid overload as a result. Improved with diuresis. Doing very well now with continued lasix and blood pressure control. Some discussion of possible RHC during hospitalization, elected for outpatient follow up. Dry weight 185lb.  - Will refer to cardiology.  - BMET today - Continue lasix at current dose.  - Continue lisinopril for BP control.  - PFT's  - Sleep study. - Referred to Pharmacy clinic for smoking cessation    # HCM  - check hep C - CBC today.  - needs c scope - needs mammogram - pap smear - f/u in 2 months for check up / HCM

## 2015-08-23 NOTE — Patient Instructions (Addendum)
Thanks for coming in today.   We will check your potassium and kidney function. Will also screen Hep C  We have referred you to cardiology. You should be called with this appointment.   We also referred you for a sleep study. You will be contacted with this appointment.   On your way out today, make an appointment with Dr. Valentina Lucks for PFT's and Smoking Cessation.   Otherwise, keep working on quitting smoking, continue to take the fluid pill and the blood pressure medicine just like you have been.   Weigh yourself every day and if your weight increases by 3 pounds in one day or 5 pounds in one week, then take an extra fluid pill and make a follow up appointment.   Come back for a regular check up in 2 months.   Thanks for letting us take care of you.   Sincerely, Paula Compton, MD Family Medicine -PGY 2

## 2015-08-23 NOTE — Assessment & Plan Note (Addendum)
Pt. 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 PA pressure 73mHg on Echo during recent hospitalization, reduced RV function. Fluid overload as a result. Improved with diuresis. Doing very well now with continued lasix and blood pressure control. Some discussion of possible RHC during hospitalization, elected for outpatient follow up with Cardiology. Dry weight 185lb.  - Will refer to cardiology.  - BMET today - Continue lasix at current dose.  - Continue lisinopril for BP control.  - PFT's  - Sleep study. - Phone number updated.  - Referred to Pharmacy clinic for smoking cessation

## 2015-08-24 LAB — BASIC METABOLIC PANEL WITH GFR
BUN: 11 mg/dL (ref 7–25)
CHLORIDE: 95 mmol/L — AB (ref 98–110)
CO2: 31 mmol/L (ref 20–31)
Calcium: 10.1 mg/dL (ref 8.6–10.4)
Creat: 0.77 mg/dL (ref 0.50–1.05)
GFR, EST NON AFRICAN AMERICAN: 87 mL/min (ref >=60)
Glucose, Bld: 82 mg/dL (ref 65–99)
POTASSIUM: 4.7 mmol/L (ref 3.5–5.3)
SODIUM: 138 mmol/L (ref 135–146)

## 2015-08-24 LAB — HEPATITIS C ANTIBODY: HCV AB: NEGATIVE

## 2015-09-06 ENCOUNTER — Encounter: Payer: Self-pay | Admitting: Pharmacist

## 2015-09-06 ENCOUNTER — Ambulatory Visit: Payer: Self-pay | Attending: Internal Medicine

## 2015-09-06 ENCOUNTER — Ambulatory Visit: Payer: Self-pay | Admitting: Pharmacist

## 2015-09-06 ENCOUNTER — Ambulatory Visit (INDEPENDENT_AMBULATORY_CARE_PROVIDER_SITE_OTHER): Payer: Self-pay | Admitting: Pharmacist

## 2015-09-06 VITALS — BP 140/82 | HR 64 | Wt 185.8 lb

## 2015-09-06 DIAGNOSIS — F17200 Nicotine dependence, unspecified, uncomplicated: Secondary | ICD-10-CM

## 2015-09-06 NOTE — Progress Notes (Signed)
   S:  Patient arrives in good spirits. Patient arrives for evaluation/assistance with tobacco dependence.  Patient was referred on 08/23/15.  Patient was last seen by Primary Care Provider on 6/617.   Age when started using tobacco on a daily basis: 55 yo Number of Cigarettes per day: 10 (used to smoke 1 ppd). Brand smoked Newports. Estimated Nicotine Content per Cigarette (mg) 1.  Estimated Nicotine intake per day 10 mg.   Smokes first cigarette within 60 minutes after waking. Denies waking to smoke.  Most recent quit attempt: within the past month (hasn't ever really attempted, quit when she went into the hospital) Longest time ever been tobacco free: 1 week (because she was in the hospital- smoked first cigarette as soon as she was discharged)   Rates IMPORTANCE of quitting tobacco on 1-10 scale: 6 Rates CONFIDENCE of quitting tobacco on 1-10 scale: 6  Patient states she feels like she can do it but doesn't want to quit cold [place]. Never tried nicotine replacement therapy but interested. Husband is also a smoker. Wants to quit in the next 30 days.  Most common triggers to use tobacco include: after meals, when angry, when bored.  Motivation to quit: health  Patient was prescribed Atrovent (Ipratropium) inhaler at last visit but she said it was going to cost about $300 and that she cannot afford it. She is in the process of applying for an orange card but medicaid application was denied.  O: Filed Vitals:   09/06/15 1510  BP: 140/82  Pulse: 64   A/P: Low-Moderate Nicotine Dependence of 36 years duration in a patient who is fair candidate for success b/c of motivation to quit following recent hospitalization.  Enrolled patient through 1-800-QUIT-NOW during visit.   They are sending 14 mg nicotine patches to start as soon as possible and are sending 7 mg patches at a later date. Patient counseled on purpose, proper use, and potential adverse effects, including irritation/itching at  patch site. Also counseled pt. on the delay strategy with quit line counselor.   Written information provided.  F/U Rx Clinic Visit around 09/22/15. Total time in face-to-face counseling 45 minutes.  Patient seen with Mikael Spray, PharmD Candidate

## 2015-09-06 NOTE — Assessment & Plan Note (Signed)
Low-Moderate Nicotine Dependence of 36 years duration in a patient who is fair candidate for success b/c of motivation to quit following recent hospitalization.  Enrolled patient through 1-800-QUIT-NOW during visit.   They are sending 14 mg nicotine patches to start as soon as possible and are sending 7 mg patches at a later date. Patient counseled on purpose, proper use, and potential adverse effects, including irritation/itching at patch site. Also counseled pt. on the delay strategy with quit line counselor.

## 2015-09-06 NOTE — Patient Instructions (Signed)
Thank you for coming in today!  Continue to call 1-800-QUIT-NOW as needed. As soon as you receive your patches, apply one 14 mg patch once daily. Leave the patch on for 24 hours and then replace. You can put them anywhere a T-shirt would go. Move the patches around to prevent itching or irritation.   Follow-up in pharmacy clinic around 09/22/15.

## 2015-09-07 NOTE — Progress Notes (Signed)
Patient ID: Joy [name], female   DOB: 02/19/1961, 54 y.o.   MRN: [id number] Reviewed: Agree with Dr. Koval's documentation and management. 

## 2015-09-22 ENCOUNTER — Encounter: Payer: Self-pay | Admitting: Pharmacist

## 2015-09-22 ENCOUNTER — Ambulatory Visit (INDEPENDENT_AMBULATORY_CARE_PROVIDER_SITE_OTHER): Payer: Self-pay | Admitting: Pharmacist

## 2015-09-22 VITALS — BP 105/65 | HR 75 | Wt 184.2 lb

## 2015-09-22 DIAGNOSIS — F17200 Nicotine dependence, unspecified, uncomplicated: Secondary | ICD-10-CM

## 2015-09-22 DIAGNOSIS — I509 Heart failure, unspecified: Secondary | ICD-10-CM

## 2015-09-22 LAB — BASIC METABOLIC PANEL
BUN: 11 mg/dL (ref 7–25)
CALCIUM: 9.5 mg/dL (ref 8.6–10.4)
CHLORIDE: 99 mmol/L (ref 98–110)
CO2: 34 mmol/L — ABNORMAL HIGH (ref 20–31)
CREATININE: 0.75 mg/dL (ref 0.50–1.05)
Glucose, Bld: 83 mg/dL (ref 65–99)
POTASSIUM: 4.2 mmol/L (ref 3.5–5.3)
Sodium: 139 mmol/L (ref 135–146)

## 2015-09-22 MED ORDER — POTASSIUM CHLORIDE ER 10 MEQ PO TBCR: 10.0000 meq | EXTENDED_RELEASE_TABLET | Freq: Every day | ORAL | Status: DC

## 2015-09-22 NOTE — Progress Notes (Signed)
   S:  Patient arrives in good spirits.  Patient arrives for evaluation/assistance with tobacco dependence.  Patient was referred on 08/23/15.  Patient was last seen by Primary Care Provider on 08/23/15.   Age when started using tobacco on a daily basis 55 yo. Number of Cigarettes per day 3.  Smokes after meals.   Brand smoked Newports. Estimated Nicotine Content per Cigarette (mg) 1.  Estimated Nicotine intake per day 3 mg.    Medications (NRT, bupropion, varenicline) used in prior in past cessation efforts include: currently using 14 mg nicotine patches daily without any adverse effects.   Rates CONFIDENCE of quitting tobacco on 1-10 scale of 6.  Most common triggers to use tobacco include; after meals   Has not set a quit date and was non-committal to setting a quit date at first.  Reports she would like to be tobacco free in 30 days.     A/P: Low-Moderate Nicotine Dependence of 36 years duration in a patient who is fair candidate for success b/c of motivation to quit following recent hospitalization.  Patient is currently using the 14 mg nicotine patches and has reduced cigarettes down to 3 per day. Continued nicotine replacement tx with the 14 mg patches.  The 1-800-QUITNOW line provided patient with 2 boxes of patches and are supposed to send her 7 mg patches at a later date.  Reviewed the purpose, proper use, and potential adverse effects, including the need to continue to rotate patch site.  Discussed use of lozenges or gum as add on therapy with patches; however, patient declined and wanted to continue patches only at this time.  Patient agreed to set quit date for 10/22/15.    Blood pressure at goal today.  Patient reports adherence with lisinopril and furosemide, but stopped taking potassium one week ago.  Counseled patient to resume potassium 10 mEq daily.  Will check BMET today.    Written information provided. Provided information on 1 800-QUIT NOW support program.  F/U phone call in  one month.  F/U Rx Clinic Visit in 2 months.  Total time in face-to-face counseling 30 minutes.  Patient seen with Elisabeth Most, PharmD Resident.

## 2015-09-22 NOTE — Assessment & Plan Note (Signed)
Low-Moderate Nicotine Dependence of 36 years duration in a patient who is fair candidate for success b/c of motivation to quit following recent hospitalization.  Patient is currently using the 14 mg nicotine patches and has reduced cigarettes down to 3 per day. Continued nicotine replacement tx with the 14 mg patches.  The 1-800-QUITNOW line provided patient with 2 boxes of patches and are supposed to send her 7 mg patches at a later date.  Reviewed the purpose, proper use, and potential adverse effects, including the need to continue to rotate patch site.  Discussed use of lozenges or gum as add on therapy with patches; however, patient declined and wanted to continue patches only at this time.  Patient agreed to set quit date for 10/22/15.

## 2015-09-22 NOTE — Patient Instructions (Addendum)
Thank you for coming in today!  Great job on cutting back on cigarettes.  Continue to use the 14 mg nicotine patch every day.    Quit smoking in 30 days on 10/22/15.    Restart potassium 10 mEq daily.  Go to the lab before you leave.    Dr. Valentina Lucks will call you to follow up on quitting smoking in one month.   Schedule an appointment with PCP in two months.

## 2015-09-26 NOTE — Progress Notes (Signed)
Patient ID: Joy [name], female   DOB: 1960/09/24, 55 y.o.   MRN: [id number] Reviewed: Agree with Dr. Graylin Shiver documentation and management.

## 2015-10-11 ENCOUNTER — Encounter: Payer: Self-pay | Admitting: Interventional Cardiology

## 2015-10-14 ENCOUNTER — Telehealth: Payer: Self-pay | Admitting: Pharmacist

## 2015-10-14 NOTE — Telephone Encounter (Signed)
Patient states she has quit for 2 weeks using nicotine patches.  States patches are NOT sticking to her skin very well.    She is willing to continue using patches until follow up visit.   Requested patient to call and schedule follow up in 2 weeks with either Dr. Jerline Pain or myself.   Consider tapering patches at next visit.   Congratulated success.   Patient verbalized understanding of treatment plan.

## 2015-10-20 ENCOUNTER — Encounter (HOSPITAL_BASED_OUTPATIENT_CLINIC_OR_DEPARTMENT_OTHER): Payer: Self-pay

## 2015-10-23 ENCOUNTER — Other Ambulatory Visit: Payer: Self-pay | Admitting: Family Medicine

## 2015-10-26 ENCOUNTER — Other Ambulatory Visit: Payer: Self-pay | Admitting: *Deleted

## 2015-10-27 MED ORDER — FUROSEMIDE 40 MG PO TABS: 40.0000 mg | ORAL_TABLET | Freq: Two times a day (BID) | ORAL | 1 refills | Status: DC

## 2015-10-27 MED ORDER — LISINOPRIL 5 MG PO TABS: 5.0000 mg | ORAL_TABLET | Freq: Every day | ORAL | 2 refills | Status: DC

## 2015-10-31 ENCOUNTER — Ambulatory Visit: Payer: Self-pay | Admitting: Interventional Cardiology

## 2015-11-04 ENCOUNTER — Ambulatory Visit (INDEPENDENT_AMBULATORY_CARE_PROVIDER_SITE_OTHER): Payer: Self-pay | Admitting: *Deleted

## 2015-11-04 DIAGNOSIS — Z111 Encounter for screening for respiratory tuberculosis: Secondary | ICD-10-CM

## 2015-11-04 NOTE — Progress Notes (Signed)
   PPD placed Left Forearm.  Pt to return Monday 11/07/15 at 11 AM for reading.  Pt tolerated intradermal injection. Derl Barrow, RN

## 2015-11-07 ENCOUNTER — Encounter: Payer: Self-pay | Admitting: *Deleted

## 2015-11-07 ENCOUNTER — Ambulatory Visit (INDEPENDENT_AMBULATORY_CARE_PROVIDER_SITE_OTHER): Payer: Self-pay | Admitting: *Deleted

## 2015-11-07 DIAGNOSIS — Z7689 Persons encountering health services in other specified circumstances: Secondary | ICD-10-CM

## 2015-11-07 DIAGNOSIS — Z111 Encounter for screening for respiratory tuberculosis: Secondary | ICD-10-CM

## 2015-11-07 LAB — TB SKIN TEST
INDURATION: 0 mm
TB Skin Test: NEGATIVE

## 2015-11-07 NOTE — Progress Notes (Signed)
   PPD Reading Note PPD read and results entered in [place]. Result: 0 mm induration. Interpretation: Negtive If test not read within 48-72 hours of initial placement, patient advised to repeat in other arm 1-3 weeks after this test. Allergic reaction: no  Derl Barrow, RN

## 2015-11-15 ENCOUNTER — Telehealth: Payer: Self-pay | Admitting: *Deleted

## 2015-11-15 NOTE — Telephone Encounter (Signed)
Done in error.

## 2015-11-16 ENCOUNTER — Telehealth: Payer: Self-pay | Admitting: *Deleted

## 2015-11-16 NOTE — Telephone Encounter (Signed)
I called patient to follow-up with 64-monthREDS_0  Study. Patient has not been re-hospitalized in last 3 months. I thanked patient for her participation in the study.

## 2015-12-08 ENCOUNTER — Other Ambulatory Visit: Payer: Self-pay | Admitting: *Deleted

## 2015-12-09 ENCOUNTER — Other Ambulatory Visit: Payer: Self-pay | Admitting: *Deleted

## 2015-12-09 MED ORDER — LISINOPRIL 5 MG PO TABS: 5.0000 mg | ORAL_TABLET | Freq: Every day | ORAL | 0 refills | Status: DC

## 2015-12-09 MED ORDER — FUROSEMIDE 40 MG PO TABS: 40.0000 mg | ORAL_TABLET | Freq: Two times a day (BID) | ORAL | 1 refills | Status: DC

## 2015-12-09 MED ORDER — LISINOPRIL 5 MG PO TABS: 5.0000 mg | ORAL_TABLET | Freq: Every day | ORAL | 2 refills | Status: DC

## 2015-12-09 NOTE — Telephone Encounter (Signed)
Rx filled. Patient needs appointment for further refills.  [name]. Jerline Pain, [place] Medicine Resident PGY-3 12/09/2015 12:26 PM

## 2015-12-09 NOTE — Telephone Encounter (Signed)
90 day refill request.  Derl Barrow, RN

## 2016-01-03 ENCOUNTER — Other Ambulatory Visit: Payer: Self-pay | Admitting: *Deleted

## 2016-01-03 MED ORDER — POTASSIUM CHLORIDE ER 10 MEQ PO TBCR: 10.0000 meq | EXTENDED_RELEASE_TABLET | Freq: Every day | ORAL | 0 refills | Status: DC

## 2016-01-06 ENCOUNTER — Other Ambulatory Visit: Payer: Self-pay | Admitting: Family Medicine

## 2016-02-16 ENCOUNTER — Other Ambulatory Visit: Payer: Self-pay | Admitting: Family Medicine

## 2016-06-25 ENCOUNTER — Other Ambulatory Visit: Payer: Self-pay | Admitting: Family Medicine

## 2016-06-26 NOTE — Telephone Encounter (Signed)
Attempted to contact- no answer and VM was left to call the office to schedule a FU apt with PCP.

## 2016-06-26 NOTE — Telephone Encounter (Signed)
30 day supply given. Patient has to have appointment for further refills.   Joy [name]. Jerline Pain, [place] Resident PGY-3 06/26/2016 11:55 AM

## 2016-07-04 ENCOUNTER — Ambulatory Visit (INDEPENDENT_AMBULATORY_CARE_PROVIDER_SITE_OTHER): Payer: Self-pay | Admitting: Family Medicine

## 2016-07-04 ENCOUNTER — Encounter: Payer: Self-pay | Admitting: Family Medicine

## 2016-07-04 VITALS — BP 104/62 | HR 82 | Temp 98.5°F | Ht 67.0 in | Wt 180.0 lb

## 2016-07-04 DIAGNOSIS — I10 Essential (primary) hypertension: Secondary | ICD-10-CM

## 2016-07-04 LAB — POCT GLYCOSYLATED HEMOGLOBIN (HGB A1C): HEMOGLOBIN A1C: 5.5

## 2016-07-04 MED ORDER — ALBUTEROL SULFATE HFA 108 (90 BASE) MCG/ACT IN AERS: 2.0000 | INHALATION_SPRAY | Freq: Four times a day (QID) | RESPIRATORY_TRACT | 2 refills | Status: DC | PRN

## 2016-07-04 MED ORDER — OMEPRAZOLE 20 MG PO CPDR: 20.0000 mg | DELAYED_RELEASE_CAPSULE | Freq: Every day | ORAL | 3 refills | Status: DC

## 2016-07-04 NOTE — Progress Notes (Signed)
Subjective:  Joy [name] is a 56 y.o. female who presents to the [facility] today with a chief complaint of HTN follow up.   HPI:  Hypertension BP Readings from Last 3 Encounters:  07/04/16 104/62  09/22/15 105/65  09/06/15 140/82   Home BP monitoring-No Compliant with medications-yes, without side effects ROS-Denies any CP, HA, SOB, blurry vision, LE edema, transient weakness, orthopnea, PND.    Cough Subacute onset over the past several months. Has a history of COPD but is not currently on any medications for this. Cough is productive of clear sputum. She has had some associated shortness of breath and wheezing. No medications tried. She has not been on any inhalers. Symptoms are worse at night.   ROS: Per HPI  PMH: Smoking history reviewed.   Objective:  Physical Exam: BP 104/62 (BP Location: Left Arm, Patient Position: Sitting, Cuff Size: Normal)   Pulse 82   Temp 98.5 F (36.9 C) (Oral)   Ht 5' 7" (1.702 m)   Wt 180 lb (81.6 kg)   SpO2 95%   BMI 28.19 kg/m   Gen: NAD, resting comfortably CV: RRR with no murmurs appreciated Pulm: NWOB. Mild bibasilar wheezes, otherwise clear.  GI: Normal bowel sounds present. Soft, Nontender, Nondistended. MSK: no edema, cyanosis, or clubbing noted Skin: warm, dry Neuro: grossly normal, moves all extremities Psych: Normal affect and thought content  Results for orders placed or performed in visit on 07/04/16 (from the past 72 hour(s))  HgB A1c     Status: None   Collection Time: 07/04/16 11:44 AM  Result Value Ref Range   Hemoglobin A1C 5.5    Assessment/Plan:  Essential hypertension At goal on lisinopril. Will continue.   Cough Likely secondary to poorly controlled COPD, though has a broad differential. Will restart albuterol inhaler today to see if it helps. Given that her symptoms are worse at night, will also start empiric PPI. Follow up in 1-2 weeks. If persistent would consider further work up with CXR and possible  treatment for postnasal drip. Will need PFTs to further evaluate COPD as well. Return precautions reviewed. Follow up as needed.   [name]. Jerline Pain, [place] Resident PGY-3 07/04/2016 4:45 PM

## 2016-07-04 NOTE — Patient Instructions (Signed)
Your blood pressure looks good. We will not make any changes today.  Start the albuterol inhalr for your cough.  Start the acid blocker medication.  If you symptoms are not improving in 1-2 weeks, please let me know.  Come back to see me in 3-6 months if you are doing well.   Take care,  Dr Jerline Pain

## 2016-07-04 NOTE — Assessment & Plan Note (Signed)
At goal on lisinopril. Will continue.

## 2016-07-21 ENCOUNTER — Other Ambulatory Visit: Payer: Self-pay | Admitting: Family Medicine

## 2016-08-03 ENCOUNTER — Other Ambulatory Visit: Payer: Self-pay | Admitting: Family Medicine

## 2016-08-06 ENCOUNTER — Other Ambulatory Visit: Payer: Self-pay | Admitting: *Deleted

## 2016-08-06 MED ORDER — OMEPRAZOLE 20 MG PO CPDR: 20.0000 mg | DELAYED_RELEASE_CAPSULE | Freq: Every day | ORAL | 0 refills | Status: DC

## 2016-09-17 ENCOUNTER — Other Ambulatory Visit: Payer: Self-pay | Admitting: *Deleted

## 2016-09-17 MED ORDER — POTASSIUM CHLORIDE ER 10 MEQ PO TBCR: 10.0000 meq | EXTENDED_RELEASE_TABLET | Freq: Every day | ORAL | 5 refills | Status: DC

## 2016-10-15 ENCOUNTER — Other Ambulatory Visit: Payer: Self-pay | Admitting: *Deleted

## 2016-10-15 ENCOUNTER — Other Ambulatory Visit: Payer: Self-pay | Admitting: Family Medicine

## 2016-10-15 MED ORDER — OMEPRAZOLE 20 MG PO CPDR: 20.0000 mg | DELAYED_RELEASE_CAPSULE | Freq: Every day | ORAL | 0 refills | Status: DC

## 2016-10-15 NOTE — Telephone Encounter (Signed)
Pharmacy requesting 90 day supply

## 2017-02-05 ENCOUNTER — Other Ambulatory Visit: Payer: Self-pay | Admitting: Family Medicine

## 2017-02-05 MED ORDER — FUROSEMIDE 40 MG PO TABS: 40.0000 mg | ORAL_TABLET | Freq: Two times a day (BID) | ORAL | 3 refills | Status: DC

## 2017-08-01 ENCOUNTER — Other Ambulatory Visit: Payer: Self-pay

## 2017-08-01 MED ORDER — LISINOPRIL 5 MG PO TABS: 5.0000 mg | ORAL_TABLET | Freq: Every day | ORAL | 0 refills | Status: DC

## 2017-08-01 NOTE — Telephone Encounter (Signed)
Will refill one month, but patient will need to come in to be seen. Please schedule an appointment for he rot be seen.   Thank you, [name]

## 2017-08-02 NOTE — Telephone Encounter (Signed)
VM left asking pt to schedule an apt with pcp for medication management.

## 2017-08-15 ENCOUNTER — Other Ambulatory Visit: Payer: Self-pay | Admitting: Family Medicine

## 2017-08-16 NOTE — Telephone Encounter (Signed)
LVM asking pt to schedule an apt with PCP for additional medication refills.

## 2017-08-16 NOTE — Telephone Encounter (Signed)
Will give 1 time 90 day supply, and patient will need to schedule an appointment to come see me before a refill.

## 2017-10-08 ENCOUNTER — Other Ambulatory Visit: Payer: Self-pay | Admitting: Family Medicine

## 2017-10-09 NOTE — Telephone Encounter (Signed)
Patient has an appointment 10-23-17. Jazmin Hartsell,CMA

## 2017-10-23 ENCOUNTER — Encounter: Payer: Self-pay | Admitting: Family Medicine

## 2017-12-20 ENCOUNTER — Encounter: Payer: Self-pay | Admitting: Family Medicine

## 2018-01-30 ENCOUNTER — Other Ambulatory Visit: Payer: Self-pay | Admitting: Family Medicine

## 2018-01-30 NOTE — Telephone Encounter (Signed)
Attempted to contact pt to schedule with PCP. I had to LVM. If she calls back please schedule her with PCP for medication refills and management.

## 2018-01-30 NOTE — Telephone Encounter (Signed)
Patient has not been seen since 06/2016 and did not make previous appointments. Patient currently does not have an appointment. I can no longer refill medications until she comes in to the office to be seen.

## 2018-02-10 MED ORDER — LISINOPRIL 5 MG PO TABS: 5.0000 mg | ORAL_TABLET | Freq: Every day | ORAL | 0 refills | Status: DC

## 2018-02-10 NOTE — Telephone Encounter (Signed)
Pt called back.  Appt made for 03/03/18, she has been out of meds x 2 days.   Advised I would ask MD to fill meds for 3 weeks but she MUST keep appt for additional refills.  Pt expresses understanding. [place], Salome Spotted, CMA

## 2018-02-10 NOTE — Addendum Note (Signed)
Addended by: Christen Bame D on: 02/10/2018 02:30 PM   Modules accepted: Orders

## 2018-02-22 ENCOUNTER — Other Ambulatory Visit: Payer: Self-pay | Admitting: Family Medicine

## 2018-02-27 ENCOUNTER — Other Ambulatory Visit: Payer: Self-pay

## 2018-02-28 MED ORDER — FUROSEMIDE 40 MG PO TABS: 40.0000 mg | ORAL_TABLET | Freq: Two times a day (BID) | ORAL | 0 refills | Status: DC

## 2018-02-28 NOTE — Telephone Encounter (Signed)
Will refill until appointment 12/16.

## 2018-03-03 ENCOUNTER — Emergency Department (HOSPITAL_COMMUNITY): Payer: Self-pay

## 2018-03-03 ENCOUNTER — Ambulatory Visit (INDEPENDENT_AMBULATORY_CARE_PROVIDER_SITE_OTHER): Payer: Self-pay | Admitting: Family Medicine

## 2018-03-03 ENCOUNTER — Inpatient Hospital Stay (HOSPITAL_COMMUNITY)
Admission: EM | Admit: 2018-03-03 | Discharge: 2018-03-06 | DRG: 291 | Disposition: A | Discharge status: Home Health | Payer: Self-pay | Source: Ambulatory Visit | Attending: Family Medicine | Admitting: Family Medicine

## 2018-03-03 ENCOUNTER — Other Ambulatory Visit: Payer: Self-pay

## 2018-03-03 ENCOUNTER — Encounter: Payer: Self-pay | Admitting: Family Medicine

## 2018-03-03 ENCOUNTER — Encounter (HOSPITAL_COMMUNITY): Payer: Self-pay

## 2018-03-03 VITALS — BP 112/82 | HR 85 | Temp 98.7°F | Ht 67.0 in | Wt 171.6 lb

## 2018-03-03 DIAGNOSIS — I2729 Other secondary pulmonary hypertension: Secondary | ICD-10-CM | POA: Diagnosis present

## 2018-03-03 DIAGNOSIS — T501X6A Underdosing of loop [high-ceiling] diuretics, initial encounter: Secondary | ICD-10-CM | POA: Diagnosis present

## 2018-03-03 DIAGNOSIS — Z66 Do not resuscitate: Secondary | ICD-10-CM | POA: Diagnosis present

## 2018-03-03 DIAGNOSIS — I110 Hypertensive heart disease with heart failure: Principal | ICD-10-CM | POA: Diagnosis present

## 2018-03-03 DIAGNOSIS — T486X6A Underdosing of antiasthmatics, initial encounter: Secondary | ICD-10-CM | POA: Diagnosis present

## 2018-03-03 DIAGNOSIS — I272 Pulmonary hypertension, unspecified: Secondary | ICD-10-CM | POA: Diagnosis present

## 2018-03-03 DIAGNOSIS — E876 Hypokalemia: Secondary | ICD-10-CM | POA: Diagnosis present

## 2018-03-03 DIAGNOSIS — I509 Heart failure, unspecified: Secondary | ICD-10-CM

## 2018-03-03 DIAGNOSIS — K219 Gastro-esophageal reflux disease without esophagitis: Secondary | ICD-10-CM | POA: Diagnosis present

## 2018-03-03 DIAGNOSIS — Z9851 Tubal ligation status: Secondary | ICD-10-CM

## 2018-03-03 DIAGNOSIS — J449 Chronic obstructive pulmonary disease, unspecified: Secondary | ICD-10-CM | POA: Diagnosis present

## 2018-03-03 DIAGNOSIS — Z23 Encounter for immunization: Secondary | ICD-10-CM

## 2018-03-03 DIAGNOSIS — I5033 Acute on chronic diastolic (congestive) heart failure: Secondary | ICD-10-CM

## 2018-03-03 DIAGNOSIS — Z91120 Patient's intentional underdosing of medication regimen due to financial hardship: Secondary | ICD-10-CM

## 2018-03-03 DIAGNOSIS — I50810 Right heart failure, unspecified: Secondary | ICD-10-CM | POA: Diagnosis present

## 2018-03-03 DIAGNOSIS — Z79899 Other long term (current) drug therapy: Secondary | ICD-10-CM

## 2018-03-03 DIAGNOSIS — Z720 Tobacco use: Secondary | ICD-10-CM

## 2018-03-03 DIAGNOSIS — F17210 Nicotine dependence, cigarettes, uncomplicated: Secondary | ICD-10-CM | POA: Diagnosis present

## 2018-03-03 DIAGNOSIS — D751 Secondary polycythemia: Secondary | ICD-10-CM | POA: Diagnosis present

## 2018-03-03 DIAGNOSIS — J9621 Acute and chronic respiratory failure with hypoxia: Secondary | ICD-10-CM | POA: Diagnosis present

## 2018-03-03 DIAGNOSIS — R0902 Hypoxemia: Secondary | ICD-10-CM

## 2018-03-03 DIAGNOSIS — I5082 Biventricular heart failure: Secondary | ICD-10-CM | POA: Diagnosis present

## 2018-03-03 DIAGNOSIS — I4581 Long QT syndrome: Secondary | ICD-10-CM | POA: Diagnosis present

## 2018-03-03 HISTORY — DX: Heart failure, unspecified: I50.9

## 2018-03-03 LAB — CBC WITH DIFFERENTIAL/PLATELET
Abs Immature Granulocytes: 0.02 10*3/uL (ref 0.00–0.07)
Basophils Absolute: 0.1 10*3/uL (ref 0.0–0.1)
Basophils Relative: 1 %
Eosinophils Absolute: 0.1 10*3/uL (ref 0.0–0.5)
Eosinophils Relative: 1 %
HEMATOCRIT: 55.9 % — AB (ref 36.0–46.0)
Hemoglobin: 17.1 g/dL — ABNORMAL HIGH (ref 12.0–15.0)
Immature Granulocytes: 0 %
Lymphocytes Relative: 36 %
Lymphs Abs: 2.7 10*3/uL (ref 0.7–4.0)
MCH: 27.6 pg (ref 26.0–34.0)
MCHC: 30.6 g/dL (ref 30.0–36.0)
MCV: 90.2 fL (ref 80.0–100.0)
Monocytes Absolute: 1 10*3/uL (ref 0.1–1.0)
Monocytes Relative: 13 %
Neutro Abs: 3.8 10*3/uL (ref 1.7–7.7)
Neutrophils Relative %: 49 %
Platelets: 321 10*3/uL (ref 150–400)
RBC: 6.2 MIL/uL — ABNORMAL HIGH (ref 3.87–5.11)
RDW: 14.1 % (ref 11.5–15.5)
WBC: 7.7 10*3/uL (ref 4.0–10.5)
nRBC: 0 % (ref 0.0–0.2)

## 2018-03-03 LAB — BASIC METABOLIC PANEL
Anion gap: 11 (ref 5–15)
BUN: 12 mg/dL (ref 6–20)
CO2: 34 mmol/L — AB (ref 22–32)
Calcium: 8.5 mg/dL — ABNORMAL LOW (ref 8.9–10.3)
Chloride: 92 mmol/L — ABNORMAL LOW (ref 98–111)
Creatinine, Ser: 1.04 mg/dL — ABNORMAL HIGH (ref 0.44–1.00)
GFR calc Af Amer: >60 mL/min (ref >60)
GFR calc non Af Amer: 59 mL/min — ABNORMAL LOW (ref >60)
GLUCOSE: 98 mg/dL (ref 70–99)
Potassium: 3.4 mmol/L — ABNORMAL LOW (ref 3.5–5.1)
Sodium: 137 mmol/L (ref 135–145)

## 2018-03-03 LAB — I-STAT TROPONIN, ED
Troponin i, poc: 0.02 ng/mL (ref 0.00–0.08)
Troponin i, poc: 0.01 ng/mL (ref 0.00–0.08)

## 2018-03-03 LAB — TSH: TSH: 1.259 u[IU]/mL (ref 0.350–4.500)

## 2018-03-03 LAB — BRAIN NATRIURETIC PEPTIDE: B Natriuretic Peptide: 450.1 pg/mL — ABNORMAL HIGH (ref 0.0–100.0)

## 2018-03-03 LAB — TROPONIN I: Troponin I: 0.04 ng/mL (ref <0.03)

## 2018-03-03 MED ORDER — FUROSEMIDE 10 MG/ML IJ SOLN
40.0000 mg | Freq: Once | INTRAMUSCULAR | Status: AC
  Administered 2018-03-03: 40 mg via INTRAVENOUS
  Filled 2018-03-03: qty 4

## 2018-03-03 MED ORDER — SODIUM CHLORIDE 0.9% FLUSH: 3.0000 mL | INTRAVENOUS | Status: DC | PRN

## 2018-03-03 MED ORDER — ACETAMINOPHEN 325 MG PO TABS: 650.0000 mg | ORAL_TABLET | Freq: Four times a day (QID) | ORAL | Status: DC | PRN

## 2018-03-03 MED ORDER — SODIUM CHLORIDE 0.9% FLUSH
3.0000 mL | Freq: Two times a day (BID) | INTRAVENOUS | Status: DC
  Administered 2018-03-03 – 2018-03-06 (×6): 3 mL via INTRAVENOUS

## 2018-03-03 MED ORDER — POLYETHYLENE GLYCOL 3350 17 G PO PACK: 17.0000 g | PACK | Freq: Every day | ORAL | Status: DC | PRN

## 2018-03-03 MED ORDER — PANTOPRAZOLE SODIUM 40 MG PO TBEC
40.0000 mg | DELAYED_RELEASE_TABLET | Freq: Every day | ORAL | Status: DC
  Administered 2018-03-03 – 2018-03-06 (×4): 40 mg via ORAL
  Filled 2018-03-03 (×4): qty 1

## 2018-03-03 MED ORDER — INFLUENZA VAC SPLIT QUAD 0.5 ML IM SUSY
0.5000 mL | PREFILLED_SYRINGE | INTRAMUSCULAR | Status: AC
  Administered 2018-03-06: 0.5 mL via INTRAMUSCULAR
  Filled 2018-03-03: qty 0.5

## 2018-03-03 MED ORDER — ONDANSETRON HCL 4 MG PO TABS: 4.0000 mg | ORAL_TABLET | Freq: Four times a day (QID) | ORAL | Status: DC | PRN

## 2018-03-03 MED ORDER — ONDANSETRON HCL 4 MG/2ML IJ SOLN: 4.0000 mg | Freq: Four times a day (QID) | INTRAMUSCULAR | Status: DC | PRN

## 2018-03-03 MED ORDER — ENOXAPARIN SODIUM 40 MG/0.4ML ~~LOC~~ SOLN
40.0000 mg | SUBCUTANEOUS | Status: DC
  Administered 2018-03-03 – 2018-03-05 (×3): 40 mg via SUBCUTANEOUS
  Filled 2018-03-03 (×3): qty 0.4

## 2018-03-03 MED ORDER — ACETAMINOPHEN 650 MG RE SUPP: 650.0000 mg | Freq: Four times a day (QID) | RECTAL | Status: DC | PRN

## 2018-03-03 MED ORDER — SODIUM CHLORIDE 0.9 % IV SOLN: 250.0000 mL | INTRAVENOUS | Status: DC | PRN

## 2018-03-03 MED ORDER — FUROSEMIDE 10 MG/ML IJ SOLN
40.0000 mg | Freq: Every day | INTRAMUSCULAR | Status: DC
  Administered 2018-03-04: 40 mg via INTRAVENOUS
  Filled 2018-03-03: qty 4

## 2018-03-03 MED ORDER — POTASSIUM CHLORIDE CRYS ER 20 MEQ PO TBCR
40.0000 meq | EXTENDED_RELEASE_TABLET | Freq: Two times a day (BID) | ORAL | Status: AC
  Administered 2018-03-03 – 2018-03-04 (×2): 40 meq via ORAL
  Filled 2018-03-03 (×2): qty 2

## 2018-03-03 MED ORDER — ALBUTEROL SULFATE (2.5 MG/3ML) 0.083% IN NEBU: 2.5000 mg | INHALATION_SOLUTION | RESPIRATORY_TRACT | Status: DC | PRN

## 2018-03-03 NOTE — ED Notes (Signed)
Please note: UA already collected and in "Main Lab" tray at triage.

## 2018-03-03 NOTE — Progress Notes (Signed)
Subjective:    Patient ID: Joy [name], female    DOB: 1960/12/28, 57 y.o.   MRN: [id number]   CC: Follow-up for blood pressure  HPI:  Shortness of breath Patient presenting today for follow-up for blood pressure.  However obtaining vitals O2 sat was 81% on multiple checks.  Patient does report that she has been having shortness of breath for 2 weeks.  Reports that she can only walk about 1 block before she is has to stop in order to catch her breath which is a lot less than she normally walks.  Reports that she has also had some coughing with some mucus.  Patient does not have insurance and is not able to afford her albuterol so she has not been taking any sort of inhalers.  Does have a diagnosis of COPD as well as heart failure.  Patient does report that she has been taking her Lasix 40 mg twice daily.  She does not take this when she is at work as it makes her go to the bathroom a lot.  She does not think that she has any increased swelling from her baseline.  Reports that she has been using 2 pillows at night in order to prop her up which is new.  Thinks that she is also been having to sit up more during the day in order to help with her breathing.  Denies any fevers or chills.  Denies any recent illness.  Patient does believe that she has gained some weight however she has not been tracking it. ROS: Reports nausea, denies vomiting, reports orthopnea and shortness of breath, denies wheezing, denies fevers and chills  Smoking status reviewed  ROS: 10 point ROS is otherwise negative, except as mentioned in HPI  Patient Active Problem List   Diagnosis Date Noted  . Acute on chronic diastolic CHF (congestive heart failure) ([place]) 08/11/2015  . Acute right heart failure ([place]) 08/11/2015  . Right heart failure ([place]) 08/10/2015  . COPD (chronic obstructive pulmonary disease) ([place]) 08/10/2015  . Acute dyspnea   . Pulmonary hypertension ([place])   . Acute congestive heart failure ([place])   . CHF  (congestive heart failure) ([place]) 08/08/2015  . Heart failure ([place]) 08/08/2015  . Bilateral lower extremity edema   . Essential hypertension   . Hypoxia   . Hypervolemia   . 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 ANEMIA, IRON DEFICIENCY, UNSPEC. 05/16/2006  . TOBACCO DEPENDENCE 05/16/2006  . GASTROESOPHAGEAL REFLUX, NO ESOPHAGITIS 05/16/2006     Objective:  BP 112/82   Pulse 85   Temp 98.7 F (37.1 C) (Oral)   Ht _0  (1.702 m)   Wt 171 lb 9.6 oz (77.8 kg)   SpO2 (!) 81%   BMI 26.88 kg/m   95% on 2L [place] Vitals and nursing note reviewed  General: NAD, pleasant Cardiac: RRR, normal heart sounds, no murmurs Respiratory: Diminished breath sounds throughout, no crackles or wheezing noted, normal effort Abdomen: soft, nontender, nondistended Extremities: Anasarca with 3+ pitting edema. WWP. Skin: warm and dry, no rashes noted Neuro: alert and oriented, no focal deficits Psych: Flat affect  Assessment & Plan:    Acute on chronic diastolic CHF (congestive heart failure) ([place]) Patient with anasarca on exam with 3+ pitting edema.  On lung exam patient has diminished breath sounds throughout.  No crackles noted.  Patient's O2 sat 81% on multiple rechecks and placed on 2 L [place], O2 sat increased to 95%.  Patient stable on this.  Patient wheeled to  the ER by CMA given new oxygen requirement.  Reports compliance with Lasix 40 mg twice daily.  Patient not seen in office since 06/2016 and likely requires higher dosage of medications. Patient also with history of COPD however denies any wheezing but does report productive cough for past 2 weeks.  This may be adding to her increased shortness of breath.  Likely the patient will need to be admitted to hospital for further work-up.  Patient also evaluated in room by preceptor, Dr. Andria Frames.  [name] Keali Mccraw, DO Family Medicine Resident PGY-2

## 2018-03-03 NOTE — ED Provider Notes (Signed)
[facility] EMERGENCY DEPARTMENT Provider Note   CSN: [id number] Arrival date & time: 03/03/18  1605   History   Chief Complaint Chief Complaint  Patient presents with  . Shortness of Breath    HPI Joy [name] is a 57 y.o. female.  HPI Patient is a 57 year old female with history of CHF, hypertension, GERD who presents to the emergency department from family medicine clinic today for evaluation of hypoxia.  Patient initially went to my clinic today for follow-up of hypertension.  Found to be hypoxic to 81% on room air.  Patient reports has been having shortness of breath for the past 2 weeks.  Has also been having occasional cough with some mucus production.  States he has been compliant with her home medications which includes Lasix.  Does not have any increased leg swelling from baseline.  No cough, fever, or chills.  No recent illnesses.  Has been having occasional chest tightness around her chest.   Past Medical History:  Diagnosis Date  . CHF (congestive heart failure) ([place])   . GERD (gastroesophageal reflux disease)   . Hypertension   . Leg swelling hospitalized 08/08/2015   BLE    Patient Active Problem List   Diagnosis Date Noted  . Acute on chronic diastolic CHF (congestive heart failure) ([place]) 08/11/2015  . Acute right heart failure ([place]) 08/11/2015  . Right heart failure ([place]) 08/10/2015  . COPD (chronic obstructive pulmonary disease) ([place]) 08/10/2015  . Acute dyspnea   . Pulmonary hypertension ([place])   . Acute congestive heart failure ([place])   . CHF (congestive heart failure) ([place]) 08/08/2015  . Heart failure ([place]) 08/08/2015  . Bilateral lower extremity edema   . Essential hypertension   . Hypoxia   . Hypervolemia   . 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 ANEMIA, IRON DEFICIENCY, UNSPEC. 05/16/2006  . Tobacco abuse 05/16/2006  . GASTROESOPHAGEAL REFLUX, NO ESOPHAGITIS 05/16/2006    Past Surgical History:  Procedure Laterality Date  . TUBAL  LIGATION       OB History   No obstetric history on file.      Home Medications    Prior to Admission medications   Medication Sig Start Date End Date Taking? Authorizing Provider  furosemide (LASIX) 40 MG tablet Take 1 tablet (40 mg total) by mouth 2 (two) times daily. 02/28/18  Yes Enid Derry, [name], DO  lisinopril (PRINIVIL,ZESTRIL) 5 MG tablet TAKE 1 TABLET BY MOUTH EVERY DAY Patient taking differently: Take 5 mg by mouth daily.  02/24/18  Yes Enid Derry, [name], DO  Multiple Vitamin (MULTIVITAMIN WITH MINERALS) TABS tablet Take 1 tablet by mouth daily.   Yes [provider]  albuterol (PROVENTIL HFA;VENTOLIN HFA) 108 (90 Base) MCG/ACT inhaler Inhale 2 puffs into the lungs every 6 (six) hours as needed for wheezing or shortness of breath. Patient not taking: Reported on 03/03/2018 07/04/16   Vivi Barrack, MD  lisinopril (PRINIVIL,ZESTRIL) 5 MG tablet Take 1 tablet (5 mg total) by mouth daily. Patient not taking: Reported on 03/03/2018 02/10/18   Shirley, [name], DO  omeprazole (PRILOSEC) 20 MG capsule Take 1 capsule (20 mg total) by mouth daily. Patient not taking: Reported on 03/03/2018 10/15/16   Eloise Levels, MD  potassium chloride (KLOR-CON 10) 10 MEQ tablet Take 1 tablet (10 mEq total) by mouth daily. Patient not taking: Reported on 03/03/2018 09/17/16   Eloise Levels, MD    Family History No family history on file.  Social History Social History   Tobacco  Use  . Smoking status: Current Every Day Smoker    Packs/day: 0.15    Years: 36.00    Pack years: 5.40    Types: Cigarettes  . Smokeless tobacco: Former Systems developer    Types: Chew  . Tobacco comment: "quit chewing in my early '20s"  Substance Use Topics  . Alcohol use: Yes    Alcohol/week: 23.0 standard drinks    Types: 12 Cans of beer, 11 Shots of liquor per week  . Drug use: Yes    Types: Marijuana    Comment: 08/08/2015 "I smoke marijuana when I have it; probably a couple times/week"     Allergies     Patient has no known allergies.   Review of Systems Review of Systems  Constitutional: Negative for chills and fever.  HENT: Negative for congestion and sore throat.   Eyes: Negative for visual disturbance.  Respiratory: Positive for shortness of breath. Negative for cough.   Cardiovascular: Positive for chest pain (intermittent tightness x2wks, worse with exertion). Negative for leg swelling (no change from baseline).  Gastrointestinal: Negative for abdominal pain, diarrhea, nausea and vomiting.  Genitourinary: Negative for dysuria and hematuria.  Musculoskeletal: Negative for back pain and neck pain.  Skin: Negative for color change and rash.  Neurological: Negative for weakness and headaches.  All other systems reviewed and are negative.   Physical Exam Updated Vital Signs BP 102/68 (BP Location: Left Arm)   Pulse 75   Temp 98 F (36.7 C) (Oral)   Resp 20   Ht _0  (1.676 m)   Wt 75.5 kg   SpO2 95%   BMI 26.86 kg/m   Physical Exam Vitals signs and nursing note reviewed.  Constitutional:      General: She is not in acute distress.    Appearance: She is well-developed. She is not ill-appearing or toxic-appearing.  HENT:     Head: Normocephalic and atraumatic.  Eyes:     Conjunctiva/sclera: Conjunctivae normal.  Neck:     Musculoskeletal: Neck supple.     Comments: No signficant JVD Cardiovascular:     Rate and Rhythm: Regular rhythm.  Pulmonary:     Effort: Pulmonary effort is normal. No respiratory distress.     Breath sounds: Rales (bibasilar) present.     Comments: Speaking full sentences Abdominal:     General: There is no distension.     Palpations: Abdomen is soft.     Tenderness: There is no abdominal tenderness.  Musculoskeletal:     Right lower leg: Edema present.     Left lower leg: Edema present.  Skin:    General: Skin is warm and dry.     Capillary Refill: Capillary refill takes less than 2 seconds.  Neurological:     Mental Status: She is  alert.     ED Treatments / Results  Labs (all labs ordered are listed, but only abnormal results are displayed) Labs Reviewed  CBC WITH DIFFERENTIAL/PLATELET - Abnormal; Notable for the following components:      Result Value   RBC 6.20 (*)    Hemoglobin 17.1 (*)    HCT 55.9 (*)    All other components within normal limits  BASIC METABOLIC PANEL - Abnormal; Notable for the following components:   Potassium 3.4 (*)    Chloride 92 (*)    CO2 34 (*)    Creatinine, Ser 1.04 (*)    Calcium 8.5 (*)    GFR calc non Af Amer 59 (*)  All other components within normal limits  BRAIN NATRIURETIC PEPTIDE - Abnormal; Notable for the following components:   B Natriuretic Peptide 450.1 (*)    All other components within normal limits  TROPONIN I - Abnormal; Notable for the following components:   Troponin I 0.04 (*)    All other components within normal limits  TSH  HIV ANTIBODY (ROUTINE TESTING W REFLEX)  TROPONIN I  TROPONIN I  COMPREHENSIVE METABOLIC PANEL  CBC  I-STAT TROPONIN, ED  I-STAT TROPONIN, ED  I-STAT TROPONIN, ED    EKG EKG Interpretation  Date/Time:  Monday March 03 2018 16:24:56 EST Ventricular Rate:  70 PR Interval:  152 QRS Duration: 98 QT Interval:  448 QTC Calculation: 483 R Axis:   123 Text Interpretation:  Normal sinus rhythm Possible Left atrial enlargement Right axis deviation Right ventricular hypertrophy ST & T wave abnormality, consider inferior ischemia ST & T wave abnormality, consider anterolateral ischemia Prolonged QT Abnormal ECG Confirmed by Nat Christen [phone]) on 03/03/2018 4:28:24 PM   Radiology Dg Chest 2 View  Result Date: 03/03/2018 CLINICAL DATA:  57 y/o  F; 2 weeks of shortness of breath. EXAM: CHEST - 2 VIEW COMPARISON:  08/08/2015 chest radiograph. 08/10/2015 CT chest. FINDINGS: Stable cardiomegaly. Enlarged central pulmonary arteries. No consolidation, effusion, or pneumothorax. No acute osseous abnormality identified.  IMPRESSION: Stable cardiomegaly. Enlarged central pulmonary arteries may represent pulmonary artery hypertension. No focal consolidation. Electronically Signed   By: Kristine Garbe M.D.   On: 03/03/2018 17:30    Procedures Procedures (including critical care time)  Medications Ordered in ED Medications  albuterol (PROVENTIL) (2.5 MG/3ML) 0.083% nebulizer solution 2.5 mg (has no administration in time range)  potassium chloride SA (K-DUR,KLOR-CON) CR tablet 40 mEq (40 mEq Oral Given 03/03/18 2126)  pantoprazole (PROTONIX) EC tablet 40 mg (40 mg Oral Given 03/03/18 2126)  furosemide (LASIX) injection 40 mg (has no administration in time range)  enoxaparin (LOVENOX) injection 40 mg (40 mg Subcutaneous Given 03/03/18 2154)  sodium chloride flush (NS) 0.9 % injection 3 mL (3 mLs Intravenous Given 03/03/18 2126)  sodium chloride flush (NS) 0.9 % injection 3 mL (has no administration in time range)  0.9 %  sodium chloride infusion (has no administration in time range)  acetaminophen (TYLENOL) tablet 650 mg (has no administration in time range)    Or  acetaminophen (TYLENOL) suppository 650 mg (has no administration in time range)  polyethylene glycol (MIRALAX / GLYCOLAX) packet 17 g (has no administration in time range)  ondansetron (ZOFRAN) tablet 4 mg (has no administration in time range)    Or  ondansetron (ZOFRAN) injection 4 mg (has no administration in time range)  Influenza vac split quadrivalent PF (FLUARIX) injection 0.5 mL (has no administration in time range)  furosemide (LASIX) injection 40 mg (40 mg Intravenous Given 03/03/18 1857)     Initial Impression / Assessment and Plan / ED Course  I have reviewed the triage vital signs and the nursing notes.  Pertinent labs & imaging results that were available during my care of the patient were reviewed by me and considered in my medical decision making (see chart for details).    Patient is a 57 year old female with history  of CHF, hypertension, GERD who presents to the emergency department from family medicine clinic today for evaluation of hypoxia.  Patient afebrile at presentation.  She is on 2 L nasal cannula.  Exam is detailed above.  Reports shortness of breath at baseline.  Worse with ambulation.  ECG obtained that shows T wave inversions in the inferior leads.  No significant ST elevations.  History exam concerning for ACS.  CHF exacerbation also on differential.  Lower on differential is PE.  No unilateral leg swelling.  No history of DVT or PE according to patient.  Not currently anticoagulated.  Less likely infection/pneumonia given no infectious signs or symptoms.  Labs reviewed.  Metabolic panel with chronically elevated bicarb.  Creatinine 1.04 up from patient's prior labs.  No leukocytosis.  Hemoglobin of 17.  Platelets unremarkable.  Glucose within normal notes.  CXR with no acute cardiac or pulmonary abnormalities.  No focal consolidation to suggest noted.  No effusions.  No significant pulmonary congestion.  Given new ECG findings and SOA, troponin obtained which is detectable but wnl. BNP 450.    Given new O2 requirement of unclear etiology, will admit to family med for further management. Suspect CHF exacerbation. IV lasix given in ED. Stable time o ftransfer to inpt room.   Case and plan of care discussed with Dr. Tyrone Nine.   Final Clinical Impressions(s) / ED Diagnoses   Final diagnoses:  Hypoxia  Acute on chronic congestive heart failure, unspecified heart failure type [facility])    ED Discharge Orders    None       Corrie Dandy, MD 03/03/18 Romeo, [place], DO 03/03/18 2333

## 2018-03-03 NOTE — ED Notes (Signed)
Patient transported to x-ray. ?

## 2018-03-03 NOTE — Progress Notes (Addendum)
CRITICAL VALUE ALERT  Critical Value:  Troponin 0.04  Date & Time Notied:  03/03/18 at 2319   Provider Notified: Jeannine Kitten, Family Med.  Orders Received/Actions taken: test cycling

## 2018-03-03 NOTE — ED Triage Notes (Signed)
Pt reports sob for the past 2 weeks. Pt went to PCP for medication management but they sent her here due to her oxygen saturations in the 80s at the office. Pt placed on 2L [place] and it now at 97%. Denies pain, hx of COPD and CHF

## 2018-03-03 NOTE — Assessment & Plan Note (Signed)
Patient with anasarca on exam with 3+ pitting edema.  On lung exam patient has diminished breath sounds throughout.  No crackles noted.  Patient's O2 sat 81% on multiple rechecks and placed on 2 L [place], O2 sat increased to 95%.  Patient stable on this.  Patient wheeled to the ER by CMA given new oxygen requirement.  Reports compliance with Lasix 40 mg twice daily.  Patient not seen in office since 06/2016 and likely requires higher dosage of medications. Patient also with history of COPD however denies any wheezing but does report productive cough for past 2 weeks.  This may be adding to her increased shortness of breath.  Likely the patient will need to be admitted to hospital for further work-up.

## 2018-03-03 NOTE — H&P (Addendum)
[facility] Admission History and Physical Service Pager: [phone]  Patient name: Joy [name] record number: [id number] Date of birth: 1960/09/28 Age: 57 y.o. Gender: female  Primary Care Provider: Shirley, [name], DO Consultants: none Code Status: DNR  Chief Complaint: SOB  Assessment and Plan: Joy [name] is a 57 y.o. female presenting with SOB and hypoxemia . PMH is significant for CHF, COPD, Pulm HTN, HTN,   Hypoxic respiratory failure  HFpEF- 2 week history of dyspnea on exertion and an O2 saturation of 81% requiring supplemental oxygen.  Now on 2L [place]. She has a prior history of both COPD and HFpEF. She takes her BID lasix but not consistently, and does not take her albuterol because she cannot afford it.  Most recent available echo in 2017 showed 60-65% EF, normal systoilc function.  No wall motion abnormalities.  G1DD, PA peak pressure 2mHg.  CXR this admission showed no consolidation, effusions, or edema, or other acute findings. BNP is 450.  Previous BNP in 2017 was 548. Patient without h/o chest pain and EKG NSR without ST changes or t-wave abnormalities and I-trop negative in ED making ACS unlikely. Cardiovascular exam shows regular heart rate, no edema.  Pulmonary exam shows no wheezing or crackles. COPD exacerbation unlikely given lack of wheeze, sputum production, or cough, although lung exam is mostly normal except for decreased inspiratory effort. Well score 0 making PE unlikely. Suspect this is AoCHF d/t medication non-adherence. Will diurese patient and attempt to wean down oxygen.   - furosemide 457mPO BID  - supplemental O2, as needed.  Wean as able. - TTE ordered.  - trend troponins  - continuous pulse ox - telemetry x 12hr -f/u morning CBC, BMP - f/u TSH - f/u morning EKG - incentive spirometry  COPD - patient has a preexisting diagnosis of COPD and is prescribed albuterol PRN at home, which she does not take because she  cannot afford her medication. Patient will need help with access to inhalers before she leaves.   -albuterol q4h PRN - consult to SW and case management for medication affordability.   HTN - patient takes lisinopril at home 51m27mdaily. BP during admission have been 100-130.   - holding lisinopril while normotensive.   Pulmonary HTN - seen on echo in 2017 and CXR on this admission.   Hypokalemia - patient had K of 3.4 on admission. Was given kdur 93m351m.  - replete K PRN - f/u cmp  GERD - patient takes omeprazole 20mg49mhome.   - pantoprazole 93mg 44my  FEN/GI: hart healthy diet/pantoprazole/K-dur Prophylaxis: lovenox  Disposition: telemetry for now and plan to diuresis with PO lasix with goal to wean to room air within the next 24-48 hours. Will expect patient needs assistance with establishing outpatient assistnace regarding medicine and will likely go home.  History of Present Illness:  Evoleth LANGELMARIE PONZO57 y.o49female presenting with SOB and hypoxemia . PMH is significant for CHF, COPD, Pulm HTN, HTN,   Patient went to her clinic appointment today where she was found to be satting 81% on room air.  Patient was sent from clinic to ED.  In the ED a CXR did not show any acute abnormalities. I-troponintroponin was negative, BNP was 450.  She was given 93mg l38m in the ED and an albuterol treatment.   [facility] and told them she has been having shortness  of breath and was found to have an O2 saturation of 81%.  Patient states she has been having dyspnea on exertion for about 2 weeks now.  She did not have this issue before two weeks ago and is not on home oxygen.  She also states she is sleeping on two pillows now and is having some coughing at night that is waking her up.  She has a chronic cough lasting years that is nonproductive and has not worsened over the past two weeks.  The patient has smoked 1-2 packs a  day for 40 years but has decreased to a pack every two days during the last two weeks.  Her husband also smokes. She has a documented history of both CHF and COPD, for which she is prescribed lasix BID and an albuterol inhaler.  She does not always take the lasix every day and she does not use the inhaler at all because she cannot afford it.  She has not had any sick contacts lately.  She denies wheezing, syncope, chest pain.    Patient endorses some left sided abdominal discomfort that is chronic, as well as recent decrease in appetite.  She does endorse some post tussive emesis, and denies diarrhea.    Review Of Systems: Per HPI with the following additions:  Review of Systems  Constitutional: Positive for malaise/fatigue. Negative for chills and fever.  HENT: Negative for sore throat.   Respiratory: Positive for cough and shortness of breath. Negative for hemoptysis, sputum production and wheezing.   Cardiovascular: Positive for orthopnea.  Gastrointestinal: Positive for abdominal pain, heartburn, nausea and vomiting.  Skin: Negative for itching and rash.  Neurological: Negative for loss of consciousness, weakness and headaches.    Patient Active Problem List   Diagnosis Date Noted  . Acute on chronic diastolic CHF (congestive heart failure) ([place]) 08/11/2015  . Acute right heart failure ([place]) 08/11/2015  . Right heart failure ([place]) 08/10/2015  . COPD (chronic obstructive pulmonary disease) ([place]) 08/10/2015  . Acute dyspnea   . Pulmonary hypertension ([place])   . Acute congestive heart failure ([place])   . CHF (congestive heart failure) ([place]) 08/08/2015  . Heart failure ([place]) 08/08/2015  . Bilateral lower extremity edema   . Essential hypertension   . Hypoxia   . Hypervolemia   . 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 ANEMIA, IRON DEFICIENCY, UNSPEC. 05/16/2006  . TOBACCO DEPENDENCE 05/16/2006  . GASTROESOPHAGEAL REFLUX, NO ESOPHAGITIS 05/16/2006    Past Medical History: Past Medical History:   Diagnosis Date  . CHF (congestive heart failure) ([place])   . GERD (gastroesophageal reflux disease)   . Hypertension   . Leg swelling hospitalized 08/08/2015   BLE    Past Surgical History: Past Surgical History:  Procedure Laterality Date  . TUBAL LIGATION      Social History: Social History   Tobacco Use  . Smoking status: Current Every Day Smoker    Packs/day: 0.15    Years: 36.00    Pack years: 5.40    Types: Cigarettes  . Smokeless tobacco: Former Systems developer    Types: Chew  . Tobacco comment: "quit chewing in my early '20s"  Substance Use Topics  . Alcohol use: Yes    Alcohol/week: 23.0 standard drinks    Types: 12 Cans of beer, 11 Shots of liquor per week  . Drug use: Yes    Types: Marijuana    Comment: 08/08/2015 "I smoke marijuana when I have it; probably a couple times/week"   Additional social history:  Please also refer to relevant sections of EMR.  Family History: No family history on file.  Allergies and Medications: No Known Allergies No current facility-administered medications on file prior to encounter.    Current Outpatient Medications on File Prior to Encounter  Medication Sig Dispense Refill  . furosemide (LASIX) 40 MG tablet Take 1 tablet (40 mg total) by mouth 2 (two) times daily. 60 tablet 0  . lisinopril (PRINIVIL,ZESTRIL) 5 MG tablet TAKE 1 TABLET BY MOUTH EVERY DAY (Patient taking differently: Take 5 mg by mouth daily. ) 30 tablet 0  . Multiple Vitamin (MULTIVITAMIN WITH MINERALS) TABS tablet Take 1 tablet by mouth daily.    Marland Kitchen albuterol (PROVENTIL HFA;VENTOLIN HFA) 108 (90 Base) MCG/ACT inhaler Inhale 2 puffs into the lungs every 6 (six) hours as needed for wheezing or shortness of breath. (Patient not taking: Reported on 03/03/2018) 1 Inhaler 2  . lisinopril (PRINIVIL,ZESTRIL) 5 MG tablet Take 1 tablet (5 mg total) by mouth daily. (Patient not taking: Reported on 03/03/2018) 21 tablet 0  . omeprazole (PRILOSEC) 20 MG capsule Take 1 capsule (20 mg  total) by mouth daily. (Patient not taking: Reported on 03/03/2018) 90 capsule 0  . potassium chloride (KLOR-CON 10) 10 MEQ tablet Take 1 tablet (10 mEq total) by mouth daily. (Patient not taking: Reported on 03/03/2018) 30 tablet 5    Objective: BP 120/83   Pulse 71   Temp 98 F (36.7 C) (Oral)   Resp 14   Ht _0  (1.676 m)   Wt 77.6 kg   SpO2 91%   BMI 27.60 kg/m  Exam: General: alert and oriented.  No acute distress. Sitting up in a chair.   Eyes: no scleral icterus.  EOMI.  ENTM: Moist oral mucosa.   Cardiovascular: regular rhythm. Normal rate. No murmurs.  2+ radial pulse bilaterally.  No pitting edema.   Respiratory: lungs clear to auscultation bilaterally. No wheezing or crackles.  Does have slightly decreased breath sounds throughout. No increased work of breathing on room air Gastrointestinal: soft, nontender.  Normal bowel sounds.  MSK: moves extremities spontaneously.  Equal strength bilaterally.  Derm: no rashes.  Skin warm and dry.  Neuro: CN II-XII grossly intact.   Psych: pleasant affect.  Does have times when she answers questions inappropriately like answering yes/no to either/or questions.    Labs and Imaging: CBC BMET  Recent Labs  Lab 03/03/18 1638  WBC 7.7  HGB 17.1*  HCT 55.9*  PLT 321   Recent Labs  Lab 03/03/18 1638  NA 137  K 3.4*  CL 92*  CO2 34*  BUN 12  CREATININE 1.04*  GLUCOSE 98  CALCIUM 8.5Benay Pike, MD 03/03/2018, 7:33 PM PGY-1, [place] Intern pager: [phone], text pages welcome  Upper Level Addendum: I have seen and evaluated this patient along with Dr. Jeannine Kitten and reviewed the above note, making necessary revisions in blue.  Harriet [place], [place], PGY-3

## 2018-03-03 NOTE — Patient Instructions (Signed)
Patient sent to the ED.

## 2018-03-03 NOTE — ED Notes (Signed)
Family Medicine paged to Dr. Glory Rosebush to [phone] at [phone]

## 2018-03-03 NOTE — Addendum Note (Signed)
Addended by: Rilynne Lonsway, [name] J on: 03/03/2018 03:52 PM   Modules accepted: Level of Service

## 2018-03-04 ENCOUNTER — Encounter (HOSPITAL_COMMUNITY): Payer: Self-pay

## 2018-03-04 ENCOUNTER — Observation Stay (HOSPITAL_BASED_OUTPATIENT_CLINIC_OR_DEPARTMENT_OTHER): Payer: Self-pay

## 2018-03-04 DIAGNOSIS — I340 Nonrheumatic mitral (valve) insufficiency: Secondary | ICD-10-CM

## 2018-03-04 DIAGNOSIS — I361 Nonrheumatic tricuspid (valve) insufficiency: Secondary | ICD-10-CM

## 2018-03-04 DIAGNOSIS — I509 Heart failure, unspecified: Secondary | ICD-10-CM | POA: Insufficient documentation

## 2018-03-04 LAB — COMPREHENSIVE METABOLIC PANEL
ALBUMIN: 2.9 g/dL — AB (ref 3.5–5.0)
ALT: 21 U/L (ref 0–44)
AST: 28 U/L (ref 15–41)
Alkaline Phosphatase: 61 U/L (ref 38–126)
Anion gap: 12 (ref 5–15)
BILIRUBIN TOTAL: 0.9 mg/dL (ref 0.3–1.2)
BUN: 9 mg/dL (ref 6–20)
CO2: 35 mmol/L — ABNORMAL HIGH (ref 22–32)
Calcium: 8.8 mg/dL — ABNORMAL LOW (ref 8.9–10.3)
Chloride: 93 mmol/L — ABNORMAL LOW (ref 98–111)
Creatinine, Ser: 0.96 mg/dL (ref 0.44–1.00)
GFR calc Af Amer: >60 mL/min (ref >60)
GFR calc non Af Amer: >60 mL/min (ref >60)
Glucose, Bld: 94 mg/dL (ref 70–99)
POTASSIUM: 4.7 mmol/L (ref 3.5–5.1)
Sodium: 140 mmol/L (ref 135–145)
TOTAL PROTEIN: 5.7 g/dL — AB (ref 6.5–8.1)

## 2018-03-04 LAB — ECHOCARDIOGRAM COMPLETE
Height: 66 in
Weight: 2652.8 oz

## 2018-03-04 LAB — CBC
HCT: 58 % — ABNORMAL HIGH (ref 36.0–46.0)
Hemoglobin: 17.4 g/dL — ABNORMAL HIGH (ref 12.0–15.0)
MCH: 27.1 pg (ref 26.0–34.0)
MCHC: 30 g/dL (ref 30.0–36.0)
MCV: 90.3 fL (ref 80.0–100.0)
Platelets: 157 10*3/uL (ref 150–400)
RBC: 6.42 MIL/uL — ABNORMAL HIGH (ref 3.87–5.11)
RDW: 14.4 % (ref 11.5–15.5)
WBC: 5.1 10*3/uL (ref 4.0–10.5)
nRBC: 0 % (ref 0.0–0.2)

## 2018-03-04 LAB — TROPONIN I
TROPONIN I: 0.04 ng/mL — AB (ref <0.03)
Troponin I: 0.04 ng/mL (ref <0.03)

## 2018-03-04 LAB — HIV ANTIBODY (ROUTINE TESTING W REFLEX): HIV SCREEN 4TH GENERATION: NONREACTIVE

## 2018-03-04 MED ORDER — FUROSEMIDE 10 MG/ML IJ SOLN
40.0000 mg | Freq: Two times a day (BID) | INTRAMUSCULAR | Status: AC
  Administered 2018-03-04 – 2018-03-05 (×2): 40 mg via INTRAVENOUS
  Filled 2018-03-04 (×2): qty 4

## 2018-03-04 NOTE — Progress Notes (Signed)
  Echocardiogram 2D Echocardiogram has been performed.  Marybelle Killings 03/04/2018, 1:36 PM

## 2018-03-04 NOTE — Care Management Note (Signed)
Case Management Note  Patient Details  Name: Joy [name] MRN: [id number] Date of Birth: 18-Apr-1960  Subjective/Objective:   CHF, Pulm HTN, shortness of breath                 Action/Plan: Spoke to pt and states she recently started seeing her new PCP, Dr Enid Derry. She gets her meds from [facility]. Will provided pt with MATCH letter to assist with meds. Explained $3 copay and once per usage. States she does not have a neb machine at home. Will continue to follow for dc needs.   Expected Discharge Date:                Expected Discharge Plan:  Home/Self Care  In-House Referral:  NA  Discharge planning Services  [place] [facility], Medication Assistance  Post Acute Care Choice:  NA Choice offered to:  NA  DME Arranged:  N/A DME Agency:  NA  HH Arranged:  NA HH Agency:  NA  Status of Service:  In process, will continue to follow  If discussed at Long Length of Stay Meetings, dates discussed:    Additional Comments:  Erenest Rasher, RN 03/04/2018, 12:21 PM

## 2018-03-04 NOTE — Progress Notes (Addendum)
Family Medicine Teaching Service Daily Progress Note Intern Pager: [phone]  Patient name: [name] record number: [id number] Date of birth: 07-26-1960 Age: 57 y.o. Gender: female  Primary Care Provider: Shirley, [name], DO Consultants: None  Code Status: DNR  Assessment and Plan: Joy [name] is a 57 y.o. female presenting with SOB and hypoxemia. PMH is significant for CHF, COPD, Pulm HTN, HTN,   Hypoxic respiratory failure  HFpEF: Acute, improving. Patient reports breathing improved, no SOB or dyspnea on exertion while walking to restroom.  Satting well on 2L.  Bibasilar crackles on exam, minimal bilateral leg swelling, however patient states she often carries majority of her fluid in her abdomen.  Weight 75.2Kg, 77.6 yesterday.  Troponin trended flat 0.04x3.  TSH WNL.  CO2 35, Hgb 17.4, suggesting chronically hypoxic.  Echo pending. - IV furosemide 40 mg this a.m., additional dose this afternoon -Supplemental oxygen as needed, goal 88-92% - Follow-up echocardiogram when complete -Continuous pulse ox - Ambulate with pulse ox -Daily weights -Strict I's and O's  Pulmonary HTN: Chronic.   PA peak pressure 72 mmHg on Echo 2017, also noted on CXR at admit. Did not make follow-up for this with cardiology outpatient in the past.  -Consult cardiology, considerations for right heart cath vs further work up/treatment  - New echocardiogram pending, however PA pressure may be confounded by current exacerbation  COPD: Chronic, stable.  No current sputum production or wheezing, likely uncontributory to above presenting concern.  Does not have an albuterol inhaler at home due to cost. -Albuterol neb every 4 as needed - Social work and case management pending for medication affordability  HTN: Chronic, stable.  SBP 100-130.   -Monitor BP -Holding home lisinopril while normotensive - Consider transitioning to lisinopril to losartan given history of dry cough  Hypokalemia:  Resolved.  K 4.7 this am.  - Monitor BMP  GERD: Chronic, stable.   -Continue home pantoprazole 63m daily  FEN/GI:  Heart healthy Prophylaxis: lovenox  Disposition: Continue diuresis, cardiology for pulmonary hypertension  Subjective:  Doing well this morning, states she is not having any shortness of breath at rest or dyspnea on exertion while walking to the restroom.  Feels that her breathing is much improved since yesterday.  Denies any chest pain, palpitations, abdominal pain, diaphoresis.  Objective: Temp:  [97.9 F (36.6 C)-98.7 F (37.1 C)] 98.6 F (37 C) (12/17 0448) Pulse Rate:  [63-85] 68 (12/17 0448) Resp:  [12-20] 19 (12/17 0448) BP: (102-134)/(66-95) 102/66 (12/17 0448) SpO2:  [81 %-99 %] 96 % (12/17 0448) Weight:  [75.2 kg-77.8 kg] 75.2 kg (12/17 0239) Physical Exam: General: Alert, NAD HEENT: NCAT, MMM, oropharynx nonerythematous  Cardiac: RRR no m/g/r Lungs: Clear bilaterally with the exception of some bibasilar crackles no increased WOB on 2L satting well Abdomen: soft, non-tender, non-distended, normoactive BS Msk: Moves all extremities spontaneously  Ext: Warm, dry, 2+ distal pulses, +1 pitting edema bilaterally  Laboratory: Recent Labs  Lab 03/03/18 1638  WBC 7.7  HGB 17.1*  HCT 55.9*  PLT 321   Recent Labs  Lab 03/03/18 1638  NA 137  K 3.4*  CL 92*  CO2 34*  BUN 12  CREATININE 1.04*  CALCIUM 8.5*  GLUCOSE 98     Imaging/Diagnostic Tests: Dg Chest 2 View  Result Date: 03/03/2018 CLINICAL DATA:  57y/o  F; 2 weeks of shortness of breath. EXAM: CHEST - 2 VIEW COMPARISON:  08/08/2015 chest radiograph. 08/10/2015 CT chest. FINDINGS: Stable cardiomegaly. Enlarged central pulmonary arteries.  No consolidation, effusion, or pneumothorax. No acute osseous abnormality identified. IMPRESSION: Stable cardiomegaly. Enlarged central pulmonary arteries may represent pulmonary artery hypertension. No focal consolidation. Electronically Signed   By:  Kristine Garbe M.D.   On: 03/03/2018 17:30    Patriciaann Clan, DO 03/04/2018, 7:01 AM PGY-1, [place] Intern pager: [phone], text pages welcome

## 2018-03-05 LAB — CBC WITH DIFFERENTIAL/PLATELET
Abs Immature Granulocytes: 0.01 10*3/uL (ref 0.00–0.07)
Basophils Absolute: 0.1 10*3/uL (ref 0.0–0.1)
Basophils Relative: 1 %
EOS ABS: 0.1 10*3/uL (ref 0.0–0.5)
Eosinophils Relative: 2 %
HCT: 54.9 % — ABNORMAL HIGH (ref 36.0–46.0)
Hemoglobin: 16.2 g/dL — ABNORMAL HIGH (ref 12.0–15.0)
Immature Granulocytes: 0 %
Lymphocytes Relative: 36 %
Lymphs Abs: 2.2 10*3/uL (ref 0.7–4.0)
MCH: 26.8 pg (ref 26.0–34.0)
MCHC: 29.5 g/dL — ABNORMAL LOW (ref 30.0–36.0)
MCV: 90.9 fL (ref 80.0–100.0)
Monocytes Absolute: 0.8 10*3/uL (ref 0.1–1.0)
Monocytes Relative: 12 %
Neutro Abs: 3.1 10*3/uL (ref 1.7–7.7)
Neutrophils Relative %: 49 %
Platelets: 291 10*3/uL (ref 150–400)
RBC: 6.04 MIL/uL — AB (ref 3.87–5.11)
RDW: 14 % (ref 11.5–15.5)
WBC: 6.3 10*3/uL (ref 4.0–10.5)
nRBC: 0 % (ref 0.0–0.2)

## 2018-03-05 LAB — BASIC METABOLIC PANEL
Anion gap: 10 (ref 5–15)
BUN: 8 mg/dL (ref 6–20)
CALCIUM: 8.5 mg/dL — AB (ref 8.9–10.3)
CO2: 37 mmol/L — ABNORMAL HIGH (ref 22–32)
Chloride: 92 mmol/L — ABNORMAL LOW (ref 98–111)
Creatinine, Ser: 0.78 mg/dL (ref 0.44–1.00)
GFR calc Af Amer: >60 mL/min (ref >60)
GFR calc non Af Amer: >60 mL/min (ref >60)
Glucose, Bld: 80 mg/dL (ref 70–99)
Potassium: 3.8 mmol/L (ref 3.5–5.1)
Sodium: 139 mmol/L (ref 135–145)

## 2018-03-05 MED ORDER — PNEUMOCOCCAL VAC POLYVALENT 25 MCG/0.5ML IJ INJ
0.5000 mL | INJECTION | INTRAMUSCULAR | Status: AC
  Administered 2018-03-06: 0.5 mL via INTRAMUSCULAR
  Filled 2018-03-05: qty 0.5

## 2018-03-05 MED ORDER — ACETAZOLAMIDE 250 MG PO TABS
250.0000 mg | ORAL_TABLET | Freq: Every day | ORAL | Status: DC
  Administered 2018-03-05 – 2018-03-06 (×2): 250 mg via ORAL
  Filled 2018-03-05 (×2): qty 1

## 2018-03-05 MED ORDER — TORSEMIDE 20 MG PO TABS
20.0000 mg | ORAL_TABLET | Freq: Every day | ORAL | Status: DC
  Administered 2018-03-05 – 2018-03-06 (×2): 20 mg via ORAL
  Filled 2018-03-05 (×2): qty 1

## 2018-03-05 NOTE — Progress Notes (Signed)
SATURATION QUALIFICATIONS: (This note is used to comply with regulatory documentation for home oxygen)  Patient Saturations on Room Air at Rest = 90%  Patient Saturations on Room Air while Ambulating = 90-86%  Waited ~27mn for oxygen to rise to ~ 93%  Before walking again.   Patient Saturations on 1 Liters of oxygen while Ambulating = 87%  Patient Saturations on 2 Liters of oxygen while Ambulating = 95%  Please briefly explain why patient needs home oxygen: Patient required oxygen while ambulating to meet the goal of 88-92% place by the family medicine team.   TSaddie BendersRN

## 2018-03-05 NOTE — Evaluation (Signed)
Physical Therapy Evaluation Patient Details Name: Joy [name] MRN: [id number] DOB: [birth year] Today's Date: 03/05/2018   History of Present Illness  57 yo female with onset of CHF and hypoxia was admitted, had elevated troponin, pulm HTN.  PMHx:  cardiomegaly, RBBB  Clinical Impression  Pt is able to walk on the hall with PT and control her balance, with improvement in her O2 sats over last walk.  Her sats were 90% or better, with no LOB with all efforts.  Will continue on with her therapy and anticipate a transition home with HHPT to follow her for strength and endurance with monitoring of sats during therapy.     Follow Up Recommendations Home health PT;Supervision for mobility/OOB    Equipment Recommendations  None recommended by PT    Recommendations for Other Services       Precautions / Restrictions Precautions Precautions: Fall(telemetry) Restrictions Weight Bearing Restrictions: No      Mobility  Bed Mobility Overal bed mobility: Modified Independent                Transfers Overall transfer level: Needs assistance Equipment used: None Transfers: Sit to/from Stand Sit to Stand: Independent;Supervision            Ambulation/Gait Ambulation/Gait assistance: Min guard Gait Distance (Feet): 200 Feet Assistive device: 1 person hand held assist Gait Pattern/deviations: Step-to pattern;Step-through pattern;Decreased stride length;Trunk flexed Gait velocity: reduced Gait velocity interpretation: <1.31 ft/sec, indicative of household Conservation officer, historic buildings [place] (Stroke Patients Only)       Balance Overall balance assessment: Needs assistance Sitting-balance support: No upper extremity supported Sitting balance-Leahy Scale: Good     Standing balance support: No upper extremity supported Standing balance-Leahy Scale: Fair                               Pertinent Vitals/Pain Pain  Assessment: No/denies pain    Home Living Family/patient expects to be discharged to:: Private residence Living Arrangements: Spouse/significant other Available Help at Discharge: Family;Available PRN/intermittently Type of Home: House Home Access: Stairs to enter Entrance Stairs-Rails: Can reach both Entrance Stairs-Number of Steps: 4 Home Layout: One level Home Equipment: None      Prior Function Level of Independence: Independent               Hand Dominance   Dominant Hand: Right    Extremity/Trunk Assessment   Upper Extremity Assessment Upper Extremity Assessment: Overall WFL for tasks assessed    Lower Extremity Assessment Lower Extremity Assessment: Overall WFL for tasks assessed    Cervical / Trunk Assessment Cervical / Trunk Assessment: Normal  Communication   Communication: No difficulties  Cognition Arousal/Alertness: Awake/alert   Overall Cognitive Status: Within Functional Limits for tasks assessed                                        General Comments      Exercises     Assessment/Plan    PT Assessment Patient needs continued PT services  PT Problem List Decreased activity tolerance;Decreased strength;Decreased range of motion;Decreased mobility;Decreased balance;Decreased coordination;Decreased cognition;Decreased knowledge of use of DME;Decreased safety awareness;Cardiopulmonary status limiting activity       PT Treatment Interventions DME instruction;Gait training;Stair training;Functional mobility training;Therapeutic activities;Therapeutic  exercise;Balance training;Neuromuscular re-education;Patient/family education    PT Goals (Current goals can be found in the Care Plan section)  Acute Rehab PT Goals PT Goal Formulation: With patient Time For Goal Achievement: 03/19/18 Potential to Achieve Goals: Good    Frequency Min 3X/week   Barriers to discharge Decreased caregiver support home with I at times     Co-evaluation               AM-PAC PT "6 Clicks" Mobility  Outcome Measure Help needed turning from your back to your side while in a flat bed without using bedrails?: None Help needed moving from lying on your back to sitting on the side of a flat bed without using bedrails?: A Little Help needed moving to and from a bed to a chair (including a wheelchair)?: A Little Help needed standing up from a chair using your arms (e.g., wheelchair or bedside chair)?: A Little Help needed to walk in hospital room?: A Little Help needed climbing 3-5 steps with a railing? : A Little 6 Click Score: 19    End of Session Equipment Utilized During Treatment: Gait belt Activity Tolerance: Patient tolerated treatment well Patient left: in bed;with call bell/phone within reach;with bed alarm set Nurse Communication: Mobility status PT Visit Diagnosis: Unsteadiness on feet (R26.81);Muscle weakness (generalized) (M62.81);Difficulty in walking, not elsewhere classified (R26.2)    Time: 1525-1550 PT Time Calculation (min) (ACUTE ONLY): 25 min   Charges:   PT Evaluation $PT Eval Moderate Complexity: 1 Mod PT Treatments $Gait Training: 8-22 mins       Ramond Dial 03/05/2018, 7:48 PM   Mee Hives, PT MS Acute Rehab Dept. Number: [place] and [place]

## 2018-03-05 NOTE — Progress Notes (Signed)
Case Management Note  Patient Details  Name: Joy [name] MRN: [id number] Date of Birth: 11-18-1960  Subjective/Objective:   CHF, Pulm HTN, shortness of breath                 Action/Plan: Spoke to pt and states she recently started seeing her new PCP, Dr Enid Derry. She gets her meds from [facility]. Will provided pt with MATCH letter to assist with meds. Explained $3 copay and once per usage. States she does not have a neb machine at home. Will continue to follow for dc needs.  Please send meds to [facility] at time of dc.   Expected Discharge Date:                         Expected Discharge Plan:  Home/Self Care  In-House Referral:  NA  Discharge planning Services  [place] Program, Medication Assistance  Post Acute Care Choice:  NA Choice offered to:  NA  DME Arranged:  N/A DME Agency:  NA  HH Arranged:  NA HH Agency:  NA  Status of Service:  In process, will continue to follow  If discussed at Long Length of Stay Meetings, dates discussed:    Additional Comments: Erenest Rasher, RN 03/04/2018, 12:21 PM  03/05/2018 1515 NCM contacted attending to cosign oxygen order. Order was modified to included continuous. Per attending, not scheduled for dc today will have Unit RN recheck walking sat on 03/06/2018.

## 2018-03-05 NOTE — Progress Notes (Addendum)
Family Medicine Teaching Service Daily Progress Note Intern Pager: [phone]  Patient name: Joy [name] record number: [id number] Date of birth: [birth year] Age: 57 y.o. Gender: female  Primary Care Provider: Shirley, [name], DO Consultants: None  Code Status: DNR  Assessment and Plan: Joy [name] is a 57 y.o. female presenting with SOB and hypoxemia. PMH is significant for CHF, COPD, Pulm HTN, HTN,   Hypoxic respiratory failure  HFpEF: Acute, improving. Breathing back at baseline.  Lungs clear on exam, no lower extremity swelling.  Satting well on RA.  Weight 75.1, from 78.2Kg, unknown dry weight.  -3 L since admit.  Noted desaturations to 87% with ambulation, question whether this has been longstanding given her elevated CO2 (37) and polycythemia vs evidence for continued diuresis. Echo showing EF 60-65% with G2DD. -Transition Toresmide 40m p.o. twice daily, comparable from home dose -Start acetazolamide 2564mdaily  -Supplemental oxygen as needed, goal 88-92% -Continuous pulse ox -Daily weights -Strict I's and O's  Pulmonary HTN: Chronic.   PA peak pressure 61 mmHg on Echo yesterday.  Cardiology to follow-up for this outpatient, scheduled appointment for January 9.  -Follow-up outpatient  COPD: Chronic, stable.  No current sputum production or wheezing, likely uncontributory to above presenting concern.  Does not have an albuterol inhaler at home due to cost.  Case management has provided patient with match program letter to help with assistance. -Albuterol neb every 4 as needed  HTN: Chronic, stable.  SBP 100s.   -Monitor BP -Holding home lisinopril while normotensive - Consider transitioning to lisinopril to losartan given history of dry cough, if needing BP medication restarted in future  GERD: Chronic, stable.   -Continue home pantoprazole 4068maily  FEN/GI:  Heart healthy Prophylaxis: lovenox  Disposition: Trial of diuresis p.o. therapy, ambulate with  pulse ox, likely discharge tomorrow with potentially home O2  Subjective:  Doing well this morning, states her breathing is at baseline.  Having no dyspnea on exertion while walking to the bathroom.  To sleep without concern.  Denies any abdominal pain, nausea, vomiting, chest pain, shortness of breath.  Objective: Temp:  [98.6 F (37 C)-99.6 F (37.6 C)] 98.6 F (37 C) (12/18 0542) Pulse Rate:  [67-69] 67 (12/18 0542) Resp:  [16-18] 18 (12/18 0542) BP: (91-100)/(66-70) 100/66 (12/18 0542) SpO2:  [95 %-98 %] 95 % (12/18 0542) Weight:  [75.1 kg] 75.1 kg (12/18 0542) Physical Exam: General: Alert, NAD HEENT: NCAT, MMM, oropharynx nonerythematous  Cardiac: RRR no m/g/r Lungs: Clear bilaterally, no increased WOB, satting well on RA Abdomen: soft, non-tender, non-distended, normoactive BS Msk: Moves all extremities spontaneously  Ext: Warm, dry, 2+ distal pulses, no edema   Laboratory: Recent Labs  Lab 03/03/18 1638 03/04/18 0807 03/05/18 0305  WBC 7.7 5.1 6.3  HGB 17.1* 17.4* 16.2*  HCT 55.9* 58.0* 54.9*  PLT 321 157 291   Recent Labs  Lab 03/03/18 1638 03/04/18 0807 03/05/18 0305  NA 137 140 139  K 3.4* 4.7 3.8  CL 92* 93* 92*  CO2 34* 35* 37*  BUN _0 CREATININE 1.04* 0.96 0.78  CALCIUM 8.5* 8.8* 8.5*  PROT  --  5.7*  --   BILITOT  --  0.9  --   ALKPHOS  --  61  --   ALT  --  21  --   AST  --  28  --   GLUCOSE 98 94 80     Imaging/Diagnostic Tests: No results found.  Joy Clan  DO 03/05/2018, 8:45 AM PGY-1, [place] Intern pager: [phone], text pages welcome

## 2018-03-06 LAB — CBC WITH DIFFERENTIAL/PLATELET
Abs Immature Granulocytes: 0.01 10*3/uL (ref 0.00–0.07)
BASOS ABS: 0.1 10*3/uL (ref 0.0–0.1)
Basophils Relative: 1 %
Eosinophils Absolute: 0.2 10*3/uL (ref 0.0–0.5)
Eosinophils Relative: 2 %
HCT: 55.9 % — ABNORMAL HIGH (ref 36.0–46.0)
Hemoglobin: 17 g/dL — ABNORMAL HIGH (ref 12.0–15.0)
IMMATURE GRANULOCYTES: 0 %
Lymphocytes Relative: 36 %
Lymphs Abs: 2.7 10*3/uL (ref 0.7–4.0)
MCH: 27.6 pg (ref 26.0–34.0)
MCHC: 30.4 g/dL (ref 30.0–36.0)
MCV: 90.7 fL (ref 80.0–100.0)
Monocytes Absolute: 0.9 10*3/uL (ref 0.1–1.0)
Monocytes Relative: 12 %
NEUTROS PCT: 49 %
Neutro Abs: 3.6 10*3/uL (ref 1.7–7.7)
Platelets: 337 10*3/uL (ref 150–400)
RBC: 6.16 MIL/uL — ABNORMAL HIGH (ref 3.87–5.11)
RDW: 14 % (ref 11.5–15.5)
WBC: 7.3 10*3/uL (ref 4.0–10.5)
nRBC: 0 % (ref 0.0–0.2)

## 2018-03-06 LAB — BASIC METABOLIC PANEL
ANION GAP: 10 (ref 5–15)
BUN: 11 mg/dL (ref 6–20)
CO2: 36 mmol/L — ABNORMAL HIGH (ref 22–32)
Calcium: 8.6 mg/dL — ABNORMAL LOW (ref 8.9–10.3)
Chloride: 91 mmol/L — ABNORMAL LOW (ref 98–111)
Creatinine, Ser: 0.95 mg/dL (ref 0.44–1.00)
GFR calc Af Amer: >60 mL/min (ref >60)
GFR calc non Af Amer: >60 mL/min (ref >60)
Glucose, Bld: 97 mg/dL (ref 70–99)
Potassium: 4.4 mmol/L (ref 3.5–5.1)
Sodium: 137 mmol/L (ref 135–145)

## 2018-03-06 MED ORDER — TORSEMIDE 20 MG PO TABS: 20.0000 mg | ORAL_TABLET | Freq: Every day | ORAL | 0 refills | Status: DC

## 2018-03-06 MED ORDER — ACETAZOLAMIDE 250 MG PO TABS: 250.0000 mg | ORAL_TABLET | Freq: Every day | ORAL | 0 refills | Status: DC

## 2018-03-06 NOTE — Evaluation (Signed)
Occupational Therapy Evaluation Patient Details Name: Joy [name] MRN: [id number] DOB: Feb 12, 1961 Today's Date: 03/06/2018    History of Present Illness 57 yo female with onset of CHF and hypoxia was admitted, had elevated troponin, pulm HTN.  PMHx: CHF, COPD, cardiomegaly, pumlonary HTN, tobacco use, BLE edema   Clinical Impression   Pt is a 57 yo female s/p above dx. PLOF: Pt was Independent with ADLs/IADLs, mobility and working. Pt presented with no SOB observed, O2 levels 87-90% on RA with exertion; required 10 secs of pursed lip breathing on 1L O2 to return to >90%. Pt Modified Independent with ADLs standing at sink and performing own toilet hygiene with fair balance. Pt performing ADL functional transfers and ADL functional mobility with Modified Independent in room with no AD, including stairs with SupervisionA. Pt's HR remained 69 bpm to 90bpm throughout session. Pt is at her functional baseline at this time and does not require additional skilled OT at this time. Thank you for this referral.    Follow Up Recommendations  No OT follow up    Equipment Recommendations  None recommended by OT    Recommendations for Other Services       Precautions / Restrictions Precautions Precautions: Fall(telemetry; 1L O2) Restrictions Weight Bearing Restrictions: No      Mobility Bed Mobility Overal bed mobility: Modified Independent                Transfers Overall transfer level: Modified independent Equipment used: None Transfers: Sit to/from Stand Sit to Stand: Modified independent (Device/Increase time)              Balance Overall balance assessment: Needs assistance Sitting-balance support: No upper extremity supported Sitting balance-Leahy Scale: Good Sitting balance - Comments: Pt ambulating up and down stairs with Supervision to assess O2 and HR     Standing balance-Leahy Scale: Fair                             ADL either performed or  assessed with clinical judgement   ADL Overall ADL's : Modified independent                                       General ADL Comments: Performing light bathing at sink with Mod I in standing     Vision Baseline Vision/History: No visual deficits Patient Visual Report: No change from baseline Vision Assessment?: No apparent visual deficits     Perception     Praxis      Pertinent Vitals/Pain Pain Assessment: No/denies pain     Hand Dominance Right   Extremity/Trunk Assessment Upper Extremity Assessment Upper Extremity Assessment: Overall WFL for tasks assessed   Lower Extremity Assessment Lower Extremity Assessment: Defer to PT evaluation   Cervical / Trunk Assessment Cervical / Trunk Assessment: Normal   Communication Communication Communication: No difficulties   Cognition Arousal/Alertness: Awake/alert Behavior During Therapy: Flat affect Overall Cognitive Status: Within Functional Limits for tasks assessed                                     General Comments  BLE edema; elevated in recliner    Exercises Exercises: (ankle pumps)   Shoulder Instructions      Home Living Family/patient expects to be discharged to:: Private  residence Living Arrangements: Spouse/significant other Available Help at Discharge: Family;Available PRN/intermittently Type of Home: House Home Access: Stairs to enter [employer] of Steps: 4 Entrance Stairs-Rails: Can reach both Home Layout: One level     Bathroom Shower/Tub: Teacher, early years/pre: Standard     Home Equipment: None          Prior Functioning/Environment Level of Independence: Independent(working)                 OT Problem List: Increased edema      OT Treatment/Interventions:      OT Goals(Current goals can be found in the care plan section)    OT Frequency:     Barriers to D/C:            Co-evaluation              AM-PAC  OT "6 Clicks" Daily Activity     Outcome Measure Help from another person eating meals?: None Help from another person taking care of personal grooming?: None Help from another person toileting, which includes using toliet, bedpan, or urinal?: None Help from another person bathing (including washing, rinsing, drying)?: None Help from another person to put on and taking off regular upper body clothing?: None Help from another person to put on and taking off regular lower body clothing?: None 6 Click Score: 24   End of Session Equipment Utilized During Treatment: Gait belt Nurse Communication: Mobility status  Activity Tolerance: Patient tolerated treatment well Patient left: in chair;with call bell/phone within reach  OT Visit Diagnosis: Unsteadiness on feet (R26.81);Muscle weakness (generalized) (M62.81)                Time: 0684-0335 OT Time Calculation (min): 23 min Charges:  OT General Charges $OT Visit: 1 Visit OT Evaluation $OT Eval Moderate Complexity: 1 Mod OT Treatments $Self Care/Home Management : 8-22 mins  Ebony Hail Harold Hedge) Marsa Aris OTR/L Acute Rehabilitation Services Pager: [phone] Office: [phone]  Fredda Hammed 03/06/2018, 9:14 AM

## 2018-03-06 NOTE — Progress Notes (Signed)
Family Medicine Teaching Service Daily Progress Note Intern Pager: [phone]  Patient name: Joy [name] record number: [id number] Date of birth: [birth year] Age: 57 y.o. Gender: female  Primary Care Provider: Shirley, [name], DO Consultants: None  Code Status: DNR  Assessment and Plan: Joy [name] is a 57 y.o. female presenting with SOB and hypoxemia. PMH is significant for CHF, COPD, Pulm HTN, HTN,   Hypoxic respiratory failure  HFpEF: Acute, improving. Breathing at baseline.  Lungs clear. breathing back at baseline.  Lungs clear on exam, no lower extremity swelling.  Satting well, on 1L for comfort. -5L I&Os.  Desaturated to 87% with ambulation yesterday, however question whether this is acute vs chronic, will reattempt ambulation with pulse ox today.  -Cont Toresmide 71m p.o. twice daily, comparable from home dose -Cont acetazolamide 2543mdaily  -Supplemental oxygen as needed, goal 88-92%, wean as tolerated -Ambulate with pulse ox -Continuous pulse ox -Daily weights -Strict I's and O's  Pulmonary HTN: Chronic.   PA peak pressure 61 mmHg on Echo yesterday.  Cardiology to follow-up for this outpatient, scheduled appointment for January 9.  -Follow-up outpatient - Provide jury duty letter prior to discharge to ensure she can make this appointment  COPD: Chronic, stable.  No current sputum production or wheezing. Case management has provided patient with match program letter to help with assistance for albuterol at home. -Albuterol neb every 4 as needed  HTN: Chronic, stable.  SBP 100s.  Orthostatic vitals WNL.  -Monitor BP -Holding home lisinopril while normotensive, will hold at DC  GERD: Chronic, stable.   -Continue home pantoprazole 4019maily  FEN/GI:  Heart healthy Prophylaxis: lovenox  Disposition: Reevaluate ambulation with pulse ox, continue diuresis, likely discharge home today or tomorrow  Subjective:  Doing well this morning, states her breathing  is at baseline.  Not having any dyspnea on exertion.  She states she generally carries her fluid on her abdomen, but states it does not feel like that now.  She usually states that she will feel bloated and she does not feel bloated.  Denies any chest pain, palpitations, shortness of breath.  Objective: Temp:  [97.6 F (36.4 C)-98.9 F (37.2 C)] 97.6 F (36.4 C) (12/19 0600) Pulse Rate:  [66-73] 73 (12/19 0600) Resp:  [18] 18 (12/19 0600) BP: (100-105)/(67-68) 105/67 (12/19 0600) SpO2:  [90 %-99 %] 93 % (12/19 0600) Physical Exam: General: Alert, NAD HEENT: NCAT, MMM, oropharynx nonerythematous  Cardiac: RRR no m/g/r Lungs: Clear bilaterally, no increased WOB, satting well on 1L Abdomen: soft, non-tender, non-distended, normoactive BS Msk: Moves all extremities spontaneously  Ext: Warm, dry, 2+ distal pulses, no edema   Laboratory: Recent Labs  Lab 03/04/18 0807 03/05/18 0305 03/06/18 0309  WBC 5.1 6.3 7.3  HGB 17.4* 16.2* 17.0*  HCT 58.0* 54.9* 55.9*  PLT 157 291 337   Recent Labs  Lab 03/04/18 0807 03/05/18 0305 03/06/18 0309  NA 140 139 137  K 4.7 3.8 4.4  CL 93* 92* 91*  CO2 35* 37* 36*  BUN _0 CREATININE 0.96 0.78 0.95  CALCIUM 8.8* 8.5* 8.6*  PROT 5.7*  --   --   BILITOT 0.9  --   --   ALKPHOS 61  --   --   ALT 21  --   --   AST 28  --   --   GLUCOSE 94 80 97     Imaging/Diagnostic Tests: No results found.  Joy [name] 03/06/2018, 9:44 AM  PGY-1, [place] Intern pager: [phone], text pages welcome

## 2018-03-06 NOTE — Discharge Instructions (Signed)

## 2018-03-07 NOTE — Discharge Summary (Signed)
[facility] Discharge Summary  Patient name: Joy [name] record number: [id number] Date of birth: 1960-06-20 Age: 57 y.o. Gender: female Date of Admission: 03/03/2018  Date of Discharge: 03/06/2018 Admitting Physician: Martyn Malay, MD  Primary Care Provider: Shirley, [name], DO Consultants: Cardiology   Indication for Hospitalization: Hypoxemia, shortness of breath, consistent with CHF exacerbation  Discharge Diagnoses/Problem List:  Hypoxic respiratory failure, acute on chronic HFpEF, improved Pulmonary hypertension COPD Hypertension GERD Tobacco abuse   Disposition: Home  Discharge Condition: Stable  Discharge Exam:  General: Alert, NAD HEENT: NCAT, MMM, oropharynx nonerythematous  Cardiac: RRR no m/g/r Lungs: Clear bilaterally, no increased WOB, satting well on 1L Abdomen: soft, non-tender, non-distended, normoactive BS Msk: Moves all extremities spontaneously  Ext: Warm, dry, 2+ distal pulses, no edema   Brief Hospital Course:  Joy [name] is a 57 year old female, with a past history significant for HFpEF, pulmonary hypertension, tobacco use, and COPD, that presented with a progressive 2-week history of dyspnea on exertion and oxygen desaturation to 81%, found to be consistent with a heart failure exacerbation in the setting of medicine noncompliance.  Her COPD was thought to be noncontributory as she had no recent coughing, sputum production, or wheezing noted on lung exam. CXR clear.  Troponin trended flat.  Echo 03/04/2008 showing EF 60-65% and G2 DD.  Initially started on Lasix 40 mg IV twice daily, transition to torsemide 20 mg and acetazolamide 250 mg (started due to elevated CO2 35-37) daily, continued to have impressive diuresis during her stay, -5.5L at d/c (dry weight was unknown).  At discharge, she had no further bibasilar crackles, lower extremity swelling, or dyspnea on exertion.  However, she continued to have desaturations  down to 87% while walking, sent with home O2 for ambulation. Also, do question whether or not her desaturation is acute versus chronic, she is polycythemic (17)  and chronically has an elevated CO2 suggesting chronically hypoxic.    Issues for Follow Up:  1. Follow-up volume status, ensure she is compliant with diuretic therapy.  Sent home on torsemide 20 mg and acetazolamide 250 mg daily. 2. Ensure continuing reevaluation of the necessity for her home O2.  Lowest desaturation with ambulation while at the hospital was 87%.  3. Took lisinopril 5 mg for BP, however BP remained around 219 systolics for stay w/o this on board.  Discontinued lisinopril at DC, monitor her BP and treat as appropriate. 4. Pulmonary hypertension: PA peak pressure 61 mmHg.  May be contributing to presentation.  Cardiology follow-up scheduled for January 9, letter to excuse her from jury duty on that day was given at discharge.  Please ensure she remembers this appointment.  Significant Procedures: None   Significant Labs and Imaging:  Recent Labs  Lab 03/04/18 0807 03/05/18 0305 03/06/18 0309  WBC 5.1 6.3 7.3  HGB 17.4* 16.2* 17.0*  HCT 58.0* 54.9* 55.9*  PLT 157 291 337   Recent Labs  Lab 03/03/18 1638 03/04/18 0807 03/05/18 0305 03/06/18 0309  NA 137 140 139 137  K 3.4* 4.7 3.8 4.4  CL 92* 93* 92* 91*  CO2 34* 35* 37* 36*  GLUCOSE 98 94 80 97  BUN _0 CREATININE 1.04* 0.96 0.78 0.95  CALCIUM 8.5* 8.8* 8.5* 8.6*  ALKPHOS  --  61  --   --   AST  --  28  --   --   ALT  --  21  --   --  ALBUMIN  --  2.9*  --   --      Results/Tests Pending at Time of Discharge: None   Discharge Medications:  Allergies as of 03/06/2018   No Known Allergies     Medication List    STOP taking these medications   furosemide 40 MG tablet Commonly known as:  LASIX   lisinopril 5 MG tablet Commonly known as:  PRINIVIL,ZESTRIL   omeprazole 20 MG capsule Commonly known as:  PRILOSEC   potassium  chloride 10 MEQ tablet Commonly known as:  KLOR-CON 10     TAKE these medications   acetaZOLAMIDE 250 MG tablet Commonly known as:  DIAMOX Take 1 tablet (250 mg total) by mouth daily.   albuterol 108 (90 Base) MCG/ACT inhaler Commonly known as:  PROVENTIL HFA;VENTOLIN HFA Inhale 2 puffs into the lungs every 6 (six) hours as needed for wheezing or shortness of breath.   multivitamin with minerals Tabs tablet Take 1 tablet by mouth daily.   torsemide 20 MG tablet Commonly known as:  DEMADEX Take 1 tablet (20 mg total) by mouth daily.       Discharge Instructions: Please refer to Patient Instructions section of EMR for full details.  Patient was counseled important signs and symptoms that should prompt return to medical care, changes in medications, dietary instructions, activity restrictions, and follow up appointments.   Follow-Up Appointments: Follow-up Information    Guadalupe Dawn, MD. Go on 03/14/2018.   Specialty:  Family Medicine Why:  Please arrive at 9:05am for your 9:20am appointment to ensure you are seen on time. It is imperative that you keep this appointment to ensure you are recovering appropriately from your recent hospitalization due to a heart failure exacerbation. Contact information: [address] [place] [address] [place], [place], DO 03/08/2018, 12:55 PM PGY-1, [place]

## 2018-03-14 ENCOUNTER — Encounter: Payer: Self-pay | Admitting: Family Medicine

## 2018-03-14 ENCOUNTER — Ambulatory Visit (INDEPENDENT_AMBULATORY_CARE_PROVIDER_SITE_OTHER): Payer: Self-pay | Admitting: Family Medicine

## 2018-03-14 VITALS — BP 100/70 | HR 74 | Temp 98.2°F | Wt 155.4 lb

## 2018-03-14 DIAGNOSIS — I509 Heart failure, unspecified: Secondary | ICD-10-CM

## 2018-03-14 NOTE — Patient Instructions (Signed)
It was great seeing you today! I am glad that you have been doing so much better after leaving the hospital. To re-iterate the only two medications you need to take are the torsemide and acetazolamide. I gave you an excuse to get out of jury duty to make sure you dont miss your cardiology appointment.

## 2018-03-17 ENCOUNTER — Encounter: Payer: Self-pay | Admitting: Family Medicine

## 2018-03-17 NOTE — Assessment & Plan Note (Signed)
Continue home torsemide 20 mg daily.  Follow-up with cardiology in early January.  Filled out paperwork to excuse patient from jury duty to make this appointment.

## 2018-03-17 NOTE — Progress Notes (Signed)
   HPI 57 year old African-American female who presents for hospital follow-up.  Patient admitted for CHF exacerbation on 03/03/2018.  Home medication of furosemide changed to torsemide.  She was discharged on 03/06/2018.  Current medications on discharge torsemide, acetazolamide, albuterol.  Since discharge patient has had no shortness of breath, no lower extremity swelling.  Patient down 10 pounds since discharge.  Patient has cardiology follow-up scheduled in early January.  She has a summons for jury duty.  Requesting excuse to not have to do jury duty.  CC: Hospital follow-up   ROS:   Review of Systems See HPI for ROS.   CC, SH/smoking status, and VS noted  Objective: BP 100/70   Pulse 74   Temp 98.2 F (36.8 C)   Wt 155 lb 6.4 oz (70.5 kg)   SpO2 91%   BMI 25.08 kg/m  Gen: 57 year old African-American female, chronically ill-appearing, no acute distress CV: RRR, no murmur.  Trace to 1+ pitting edema bilateral lower extremity Resp: CTAB, no wheezes, non-labored Neuro: Alert and oriented, Speech clear, No gross deficits   Assessment and plan:  Congestive heart failure (HCC) Continue home torsemide 20 mg daily.  Follow-up with cardiology in early January.  Filled out paperwork to excuse patient from jury duty to make this appointment.   No orders of the defined types were placed in this encounter.   No orders of the defined types were placed in this encounter.    Guadalupe Dawn MD PGY-2 Family Medicine Resident  03/17/2018 10:43 PM

## 2018-03-21 ENCOUNTER — Other Ambulatory Visit: Payer: Self-pay | Admitting: Family Medicine

## 2018-03-27 ENCOUNTER — Ambulatory Visit: Payer: Self-pay | Admitting: Cardiology

## 2018-03-28 ENCOUNTER — Other Ambulatory Visit: Payer: Self-pay | Admitting: Family Medicine

## 2018-03-28 ENCOUNTER — Other Ambulatory Visit (HOSPITAL_COMMUNITY): Payer: Self-pay | Admitting: Family Medicine

## 2018-03-31 ENCOUNTER — Other Ambulatory Visit: Payer: Self-pay | Admitting: Family Medicine

## 2018-04-15 ENCOUNTER — Ambulatory Visit: Payer: Self-pay | Admitting: Cardiovascular Disease

## 2018-05-19 ENCOUNTER — Ambulatory Visit: Payer: Self-pay | Admitting: Internal Medicine

## 2018-05-20 ENCOUNTER — Encounter: Payer: Self-pay | Admitting: *Deleted

## 2018-10-08 ENCOUNTER — Other Ambulatory Visit: Payer: Self-pay | Admitting: Family Medicine

## 2018-10-08 NOTE — Telephone Encounter (Signed)
Patient needs to be seen in the office for labs and health check. Please schedule her an appointment. Will provide medication for one month until she may be seen.

## 2018-10-08 NOTE — Telephone Encounter (Signed)
LM for patient to call back.  Jazmin Hartsell,CMA

## 2018-10-17 ENCOUNTER — Encounter: Payer: Self-pay | Admitting: Family Medicine

## 2018-10-17 ENCOUNTER — Other Ambulatory Visit: Payer: Self-pay

## 2018-10-17 ENCOUNTER — Ambulatory Visit (INDEPENDENT_AMBULATORY_CARE_PROVIDER_SITE_OTHER): Payer: Self-pay | Admitting: Family Medicine

## 2018-10-17 VITALS — BP 102/70 | HR 96 | Ht 66.0 in | Wt 158.0 lb

## 2018-10-17 DIAGNOSIS — I10 Essential (primary) hypertension: Secondary | ICD-10-CM

## 2018-10-17 DIAGNOSIS — Z1239 Encounter for other screening for malignant neoplasm of breast: Secondary | ICD-10-CM

## 2018-10-17 DIAGNOSIS — Z1211 Encounter for screening for malignant neoplasm of colon: Secondary | ICD-10-CM

## 2018-10-17 DIAGNOSIS — I5032 Chronic diastolic (congestive) heart failure: Secondary | ICD-10-CM

## 2018-10-17 MED ORDER — TETANUS-DIPHTH-ACELL PERTUSSIS 5-2-15.5 LF-MCG/0.5 IM SUSP: 0.5000 mL | Freq: Once | INTRAMUSCULAR | 0 refills | Status: AC

## 2018-10-17 NOTE — Patient Instructions (Signed)
Thank you for coming to see me today. It was a pleasure! Today we talked about:   I have refilled your medications and will call you with your results. They will call you about a mammogram.   Please follow-up with me in 6 months or sooner as needed.  If you have any questions or concerns, please do not hesitate to call the office at [phone].  Take Care,   [name] Baneza Bartoszek, DO

## 2018-10-17 NOTE — Progress Notes (Signed)
  Subjective:  Patient ID: ADRYAN DRUCKENMILLER  DOB: 1961/03/15 MRN: [id number]  Joy [name] is a 58 y.o. female with a PMH of CHF, HTN, pulmonary HTN, COPD, history of tobacco use disorder, here today for follow-up of heart failure.   HPI:  HFpEF: -Patient reports that she is at her baseline weight although she reports that she does not weigh herself daily.  States that she has not noticed any leg swelling or shortness of breath. -She is here today for refill of her medications and lab check. -States that she is compliant with her torsemide and acetazolamide.  Health maintenance Patient has never had colon cancer screening.  She has no known family history of colon cancer.  She is self-pay however.  Interested in obtaining FIT testing. Patient is also due for mammogram Patient due for Tdap  Hypertension: - Medications: Acetazolamide 250, torsemide 20 mg - Compliance: Yes - Checking BP at home: No - Denies any SOB, CP, vision changes, LE edema, medication SEs, or symptoms of hypotension  ROS: as mentioned in HPI  Social hx: Denies use of illicit drugs, alcohol use Smoking status reviewed  Patient Active Problem List   Diagnosis Date Noted  . COPD (chronic obstructive pulmonary disease) ([place]) 08/10/2015  . Pulmonary hypertension ([place])   . Congestive heart failure ([place]) 08/08/2015  . Essential hypertension   . Tobacco abuse 05/16/2006  . GASTROESOPHAGEAL REFLUX, NO ESOPHAGITIS 05/16/2006     Objective:  BP 102/70   Pulse 96   Ht _0  (1.676 m)   Wt 158 lb (71.7 kg)   SpO2 90%   BMI 25.50 kg/m   Vitals and nursing note reviewed  General: NAD, pleasant Cardiac: RRR, normal heart sounds, no m/r/g Pulm: normal effort, CTAB Extremities: no edema or cyanosis. WWP. Skin: warm and dry, no rashes noted Neuro: alert and oriented, no focal deficits Psych: Flat affect, normal thought content  Assessment & Plan:   Essential hypertension BP well controlled at 102/70.  Continue  current medications, obtain BMP  Congestive heart failure ([place]) Continue home torsemide.  Patient to follow-up with cardiology as scheduled.  BMP WNL.  Patient at baseline weight with no signs of fluid overload on exam.  She is overall doing quite well and is happy to go back to work soon.  Health maintenance Mammogram, FIT testing and Tdap all ordered for patient today.  [name] Fraya Ueda, DO Family Medicine Resident PGY-3

## 2018-10-18 LAB — BASIC METABOLIC PANEL
BUN/Creatinine Ratio: 14 (ref 9–23)
BUN: 12 mg/dL (ref 6–24)
CO2: 28 mmol/L (ref 20–29)
Calcium: 9.6 mg/dL (ref 8.7–10.2)
Chloride: 100 mmol/L (ref 96–106)
Creatinine, Ser: 0.86 mg/dL (ref 0.57–1.00)
GFR calc Af Amer: 86 mL/min/{1.73_m2} (ref >59)
GFR calc non Af Amer: 74 mL/min/{1.73_m2} (ref >59)
Glucose: 91 mg/dL (ref 65–99)
Potassium: 3.7 mmol/L (ref 3.5–5.2)
Sodium: 144 mmol/L (ref 134–144)

## 2018-10-21 NOTE — Assessment & Plan Note (Signed)
BP well controlled at 102/70.  Continue current medications, obtain BMP

## 2018-10-21 NOTE — Assessment & Plan Note (Signed)
Continue home torsemide.  Patient to follow-up with cardiology as scheduled.  BMP WNL.  Patient at baseline weight with no signs of fluid overload on exam.  She is overall doing quite well and is happy to go back to work soon.

## 2018-11-05 ENCOUNTER — Other Ambulatory Visit: Payer: Self-pay

## 2018-11-05 MED ORDER — ACETAZOLAMIDE 250 MG PO TABS: 250.0000 mg | ORAL_TABLET | Freq: Every day | ORAL | 5 refills | Status: DC

## 2018-11-29 ENCOUNTER — Other Ambulatory Visit: Payer: Self-pay | Admitting: Family Medicine

## 2018-12-23 ENCOUNTER — Other Ambulatory Visit: Payer: Self-pay | Admitting: Family Medicine

## 2018-12-24 ENCOUNTER — Encounter (HOSPITAL_COMMUNITY): Payer: Self-pay

## 2018-12-24 ENCOUNTER — Other Ambulatory Visit: Payer: Self-pay

## 2018-12-24 ENCOUNTER — Emergency Department (HOSPITAL_COMMUNITY): Payer: Self-pay

## 2018-12-24 ENCOUNTER — Inpatient Hospital Stay (HOSPITAL_COMMUNITY)
Admission: EM | Admit: 2018-12-24 | Discharge: 2018-12-27 | DRG: 286 | Disposition: A | Discharge status: Home / Self Care | Payer: Self-pay | Attending: Family Medicine | Admitting: Family Medicine

## 2018-12-24 DIAGNOSIS — D751 Secondary polycythemia: Secondary | ICD-10-CM | POA: Diagnosis present

## 2018-12-24 DIAGNOSIS — N179 Acute kidney failure, unspecified: Secondary | ICD-10-CM | POA: Diagnosis present

## 2018-12-24 DIAGNOSIS — J449 Chronic obstructive pulmonary disease, unspecified: Secondary | ICD-10-CM | POA: Diagnosis present

## 2018-12-24 DIAGNOSIS — I2721 Secondary pulmonary arterial hypertension: Secondary | ICD-10-CM | POA: Diagnosis present

## 2018-12-24 DIAGNOSIS — E872 Acidosis: Secondary | ICD-10-CM | POA: Diagnosis present

## 2018-12-24 DIAGNOSIS — J439 Emphysema, unspecified: Secondary | ICD-10-CM

## 2018-12-24 DIAGNOSIS — F17210 Nicotine dependence, cigarettes, uncomplicated: Secondary | ICD-10-CM | POA: Diagnosis present

## 2018-12-24 DIAGNOSIS — Z66 Do not resuscitate: Secondary | ICD-10-CM | POA: Diagnosis present

## 2018-12-24 DIAGNOSIS — R911 Solitary pulmonary nodule: Secondary | ICD-10-CM | POA: Diagnosis present

## 2018-12-24 DIAGNOSIS — I5033 Acute on chronic diastolic (congestive) heart failure: Secondary | ICD-10-CM | POA: Diagnosis present

## 2018-12-24 DIAGNOSIS — R0602 Shortness of breath: Secondary | ICD-10-CM

## 2018-12-24 DIAGNOSIS — K219 Gastro-esophageal reflux disease without esophagitis: Secondary | ICD-10-CM | POA: Diagnosis present

## 2018-12-24 DIAGNOSIS — E876 Hypokalemia: Secondary | ICD-10-CM | POA: Diagnosis present

## 2018-12-24 DIAGNOSIS — I110 Hypertensive heart disease with heart failure: Principal | ICD-10-CM | POA: Diagnosis present

## 2018-12-24 DIAGNOSIS — J9601 Acute respiratory failure with hypoxia: Secondary | ICD-10-CM | POA: Diagnosis present

## 2018-12-24 DIAGNOSIS — J441 Chronic obstructive pulmonary disease with (acute) exacerbation: Secondary | ICD-10-CM | POA: Diagnosis present

## 2018-12-24 DIAGNOSIS — G4733 Obstructive sleep apnea (adult) (pediatric): Secondary | ICD-10-CM | POA: Diagnosis present

## 2018-12-24 DIAGNOSIS — Z23 Encounter for immunization: Secondary | ICD-10-CM

## 2018-12-24 DIAGNOSIS — I509 Heart failure, unspecified: Secondary | ICD-10-CM

## 2018-12-24 DIAGNOSIS — Z20828 Contact with and (suspected) exposure to other viral communicable diseases: Secondary | ICD-10-CM | POA: Diagnosis present

## 2018-12-24 DIAGNOSIS — I248 Other forms of acute ischemic heart disease: Secondary | ICD-10-CM | POA: Diagnosis present

## 2018-12-24 LAB — BASIC METABOLIC PANEL
Anion gap: 14 (ref 5–15)
BUN: 20 mg/dL (ref 6–20)
CO2: 36 mmol/L — ABNORMAL HIGH (ref 22–32)
Calcium: 9 mg/dL (ref 8.9–10.3)
Chloride: 86 mmol/L — ABNORMAL LOW (ref 98–111)
Creatinine, Ser: 1.25 mg/dL — ABNORMAL HIGH (ref 0.44–1.00)
GFR calc Af Amer: 54 mL/min — ABNORMAL LOW (ref >60)
GFR calc non Af Amer: 47 mL/min — ABNORMAL LOW (ref >60)
Glucose, Bld: 134 mg/dL — ABNORMAL HIGH (ref 70–99)
Potassium: 3.3 mmol/L — ABNORMAL LOW (ref 3.5–5.1)
Sodium: 136 mmol/L (ref 135–145)

## 2018-12-24 LAB — I-STAT CHEM 8, ED
BUN: 27 mg/dL — ABNORMAL HIGH (ref 6–20)
Calcium, Ion: 1.07 mmol/L — ABNORMAL LOW (ref 1.15–1.40)
Chloride: 87 mmol/L — ABNORMAL LOW (ref 98–111)
Creatinine, Ser: 1 mg/dL (ref 0.44–1.00)
Glucose, Bld: 105 mg/dL — ABNORMAL HIGH (ref 70–99)
HCT: 55 % — ABNORMAL HIGH (ref 36.0–46.0)
Hemoglobin: 18.7 g/dL — ABNORMAL HIGH (ref 12.0–15.0)
Potassium: 3.6 mmol/L (ref 3.5–5.1)
Sodium: 136 mmol/L (ref 135–145)
TCO2: 40 mmol/L — ABNORMAL HIGH (ref 22–32)

## 2018-12-24 LAB — CBC WITH DIFFERENTIAL/PLATELET
Abs Immature Granulocytes: 0.01 10*3/uL (ref 0.00–0.07)
Basophils Absolute: 0.1 10*3/uL (ref 0.0–0.1)
Basophils Relative: 2 %
Eosinophils Absolute: 0 10*3/uL (ref 0.0–0.5)
Eosinophils Relative: 0 %
HCT: 53.4 % — ABNORMAL HIGH (ref 36.0–46.0)
Hemoglobin: 16.1 g/dL — ABNORMAL HIGH (ref 12.0–15.0)
Immature Granulocytes: 0 %
Lymphocytes Relative: 29 %
Lymphs Abs: 1.4 10*3/uL (ref 0.7–4.0)
MCH: 29.1 pg (ref 26.0–34.0)
MCHC: 30.1 g/dL (ref 30.0–36.0)
MCV: 96.6 fL (ref 80.0–100.0)
Monocytes Absolute: 0.7 10*3/uL (ref 0.1–1.0)
Monocytes Relative: 14 %
Neutro Abs: 2.7 10*3/uL (ref 1.7–7.7)
Neutrophils Relative %: 55 %
Platelets: 281 10*3/uL (ref 150–400)
RBC: 5.53 MIL/uL — ABNORMAL HIGH (ref 3.87–5.11)
RDW: 15.2 % (ref 11.5–15.5)
WBC: 4.9 10*3/uL (ref 4.0–10.5)
nRBC: 0 % (ref 0.0–0.2)

## 2018-12-24 LAB — HEPATIC FUNCTION PANEL
ALT: 23 U/L (ref 0–44)
AST: 29 U/L (ref 15–41)
Albumin: 3.4 g/dL — ABNORMAL LOW (ref 3.5–5.0)
Alkaline Phosphatase: 67 U/L (ref 38–126)
Bilirubin, Direct: 0.2 mg/dL (ref 0.0–0.2)
Indirect Bilirubin: 0.7 mg/dL (ref 0.3–0.9)
Total Bilirubin: 0.9 mg/dL (ref 0.3–1.2)
Total Protein: 6.3 g/dL — ABNORMAL LOW (ref 6.5–8.1)

## 2018-12-24 LAB — BRAIN NATRIURETIC PEPTIDE: B Natriuretic Peptide: 909.6 pg/mL — ABNORMAL HIGH (ref 0.0–100.0)

## 2018-12-24 LAB — RAPID URINE DRUG SCREEN, HOSP PERFORMED
Amphetamines: NOT DETECTED
Barbiturates: NOT DETECTED
Benzodiazepines: NOT DETECTED
Cocaine: NOT DETECTED
Opiates: NOT DETECTED
Tetrahydrocannabinol: POSITIVE — AB

## 2018-12-24 LAB — TROPONIN I (HIGH SENSITIVITY)
Troponin I (High Sensitivity): 37 ng/L — ABNORMAL HIGH (ref <18)
Troponin I (High Sensitivity): 29 ng/L — ABNORMAL HIGH (ref <18)

## 2018-12-24 LAB — SARS CORONAVIRUS 2 BY RT PCR (HOSPITAL ORDER, PERFORMED IN ~~LOC~~ HOSPITAL LAB): SARS Coronavirus 2: NEGATIVE

## 2018-12-24 MED ORDER — ACETAMINOPHEN 650 MG RE SUPP: 650.0000 mg | Freq: Four times a day (QID) | RECTAL | Status: DC | PRN

## 2018-12-24 MED ORDER — ACETAMINOPHEN 325 MG PO TABS: 650.0000 mg | ORAL_TABLET | Freq: Four times a day (QID) | ORAL | Status: DC | PRN

## 2018-12-24 MED ORDER — TORSEMIDE 20 MG PO TABS
20.0000 mg | ORAL_TABLET | Freq: Every day | ORAL | Status: DC
  Administered 2018-12-24 – 2018-12-25 (×2): 20 mg via ORAL
  Filled 2018-12-24 (×2): qty 1

## 2018-12-24 MED ORDER — ENOXAPARIN SODIUM 40 MG/0.4ML ~~LOC~~ SOLN
40.0000 mg | SUBCUTANEOUS | Status: DC
  Administered 2018-12-24 – 2018-12-25 (×2): 40 mg via SUBCUTANEOUS
  Filled 2018-12-24 (×3): qty 0.4

## 2018-12-24 MED ORDER — ONDANSETRON HCL 4 MG PO TABS: 4.0000 mg | ORAL_TABLET | Freq: Four times a day (QID) | ORAL | Status: DC | PRN

## 2018-12-24 MED ORDER — FUROSEMIDE 10 MG/ML IJ SOLN
40.0000 mg | Freq: Once | INTRAMUSCULAR | Status: AC
  Administered 2018-12-24: 13:00:00 40 mg via INTRAVENOUS
  Filled 2018-12-24: qty 4

## 2018-12-24 MED ORDER — METHYLPREDNISOLONE SODIUM SUCC 125 MG IJ SOLR
125.0000 mg | Freq: Once | INTRAMUSCULAR | Status: AC
  Administered 2018-12-24: 125 mg via INTRAVENOUS
  Filled 2018-12-24: qty 2

## 2018-12-24 MED ORDER — PREDNISONE 20 MG PO TABS
40.0000 mg | ORAL_TABLET | Freq: Every day | ORAL | Status: DC
  Administered 2018-12-25 – 2018-12-27 (×3): 40 mg via ORAL
  Filled 2018-12-24 (×3): qty 2

## 2018-12-24 MED ORDER — NICOTINE 7 MG/24HR TD PT24
7.0000 mg | MEDICATED_PATCH | Freq: Every day | TRANSDERMAL | Status: DC
  Administered 2018-12-24 – 2018-12-27 (×4): 7 mg via TRANSDERMAL
  Filled 2018-12-24 (×4): qty 1

## 2018-12-24 MED ORDER — ALBUTEROL SULFATE HFA 108 (90 BASE) MCG/ACT IN AERS
6.0000 | INHALATION_SPRAY | Freq: Once | RESPIRATORY_TRACT | Status: AC
  Administered 2018-12-24: 10:00:00 6 via RESPIRATORY_TRACT
  Filled 2018-12-24: qty 6.7

## 2018-12-24 MED ORDER — ONDANSETRON HCL 4 MG/2ML IJ SOLN: 4.0000 mg | Freq: Four times a day (QID) | INTRAMUSCULAR | Status: DC | PRN

## 2018-12-24 MED ORDER — ALBUTEROL SULFATE HFA 108 (90 BASE) MCG/ACT IN AERS
2.0000 | INHALATION_SPRAY | Freq: Once | RESPIRATORY_TRACT | Status: AC
  Administered 2018-12-24: 2 via RESPIRATORY_TRACT

## 2018-12-24 NOTE — ED Notes (Signed)
Patient sitting in the bed with saturations above 90% on room air. Patient requested to go to the restroom and when she returned her saturations were 64% on room air and was not in distress. 6 liters nasal cannula was applied and the patient's saturations returned to the 90s. PA notified of the situation.

## 2018-12-24 NOTE — ED Notes (Signed)
Patient maintaining oxygen saturation with 2 liters of oxygen

## 2018-12-24 NOTE — ED Provider Notes (Signed)
[facility] EMERGENCY DEPARTMENT Provider Note   CSN: [id number] Arrival date & time: 12/24/18  0806     History   Chief Complaint Chief Complaint  Patient presents with   Shortness of Breath   Fatigue   Chest Pain    HPI VISTA SAWATZKY is a 58 y.o. female with PMH/o CHF, dyspnea, COPD, GERD, HTN, Pulm HTN who presents for evaluation of progressively worsening shortness of breath, generalized weakness, fatigue.  She reports that it has been ongoing for about 2 weeks but for like it is worsened over the last few days.  She states that she has been coughing and states she has had some productive cough of mucus and sputum.  No hemoptysis.  She feels like she has had some intermittent chest pain.  She feels like it is mostly with coughing but she did have an episode yesterday where she had some sharp chest pains.  She states that it is currently resolved.  She reports her trouble breathing is worse at rest and with exertion.  She feels like if she moves around, it makes it worse and he starts coughing.  She states she has had some decreased appetite over the last few weeks also.  She does have history of COPD and has been using inhalers and states she did have some improvement.  She has not had any fevers, chills, night sweats.  She denies any bilateral lower extremity swelling. She denies any OCP use, recent immobilization, prior history of DVT/PE, recent surgery, leg swelling, or long travel.  States she has been able to sleep on her side and has not had to use any pillows.  She reports she has been compliant with her medications.  Denies any recent travel, COVID-19 exposure.       The history is provided by the patient.    Past Medical History:  Diagnosis Date   Acute congestive heart failure (HCC)    Acute dyspnea    Acute on chronic diastolic CHF (congestive heart failure) ([place]) 08/11/2015   Acute right heart failure ([place]) 08/11/2015   Bilateral lower extremity  edema    CHF (congestive heart failure) (HCC)    COPD (chronic obstructive pulmonary disease) ([place]) 08/10/2015   Dx on XRAY 08/08/15.  Needs PFTs    GASTROESOPHAGEAL REFLUX, NO ESOPHAGITIS 05/16/2006   Qualifier: Diagnosis of  By: Drucie Ip     GERD (gastroesophageal reflux disease)    Hypertension    Hypervolemia    Leg swelling hospitalized 08/08/2015   BLE   PELVIC MASS 07/05/2008   Qualifier: Diagnosis of  By: McDiarmid MD, Todd     Pulmonary hypertension ([place])    Right heart failure ([place]) 08/10/2015   Tobacco abuse 05/16/2006   Qualifier: Diagnosis of  By: Drucie Ip      Patient Active Problem List   Diagnosis Date Noted   COPD (chronic obstructive pulmonary disease) ([place]) 08/10/2015   Pulmonary hypertension ([place])    Congestive heart failure ([place]) 08/08/2015   Essential hypertension    Tobacco abuse 05/16/2006   GASTROESOPHAGEAL REFLUX, NO ESOPHAGITIS 05/16/2006    Past Surgical History:  Procedure Laterality Date   TUBAL LIGATION       OB History   No obstetric history on file.      Home Medications    Prior to Admission medications   Medication Sig Start Date End Date Taking? Authorizing Provider  acetaZOLAMIDE (DIAMOX) 250 MG tablet Take 1 tablet (250 mg total) by mouth  daily. 11/05/18   Shirley, [name], DO  albuterol (PROVENTIL HFA;VENTOLIN HFA) 108 (90 Base) MCG/ACT inhaler Inhale 2 puffs into the lungs every 6 (six) hours as needed for wheezing or shortness of breath. Patient not taking: Reported on 03/03/2018 07/04/16   Vivi Barrack, MD  lisinopril (ZESTRIL) 5 MG tablet TAKE 1 TABLET BY MOUTH EVERY DAY 12/23/18   Shirley, [name], DO  Multiple Vitamin (MULTIVITAMIN WITH MINERALS) TABS tablet Take 1 tablet by mouth daily.    [provider]  torsemide (DEMADEX) 20 MG tablet TAKE 1 TABLET BY MOUTH EVERY DAY 12/01/18   Shirley, [name], DO    Family History No family history on file.  Social History Social History    Tobacco Use   Smoking status: Current Every Day Smoker    Packs/day: 0.15    Years: 36.00    Pack years: 5.40    Types: Cigarettes   Smokeless tobacco: Former Systems developer    Types: Chew   Tobacco comment: "quit chewing in my early '20s"  Substance Use Topics   Alcohol use: Yes    Alcohol/week: 23.0 standard drinks    Types: 12 Cans of beer, 11 Shots of liquor per week   Drug use: Yes    Types: Marijuana    Comment: 08/08/2015 "I smoke marijuana when I have it; probably a couple times/week"     Allergies   Patient has no known allergies.   Review of Systems Review of Systems  Constitutional: Positive for appetite change and fatigue. Negative for fever.  Respiratory: Positive for cough and shortness of breath.   Cardiovascular: Positive for chest pain. Negative for leg swelling.  Gastrointestinal: Negative for abdominal pain, nausea and vomiting.  Genitourinary: Negative for dysuria and hematuria.  Neurological: Negative for headaches.  All other systems reviewed and are negative.    Physical Exam Updated Vital Signs BP 121/79    Pulse 73    Temp 97.8 F (36.6 C) (Oral)    Resp 20    Ht _0  (1.676 m)    Wt 71 kg    SpO2 96%    BMI 25.26 kg/m   Physical Exam Vitals signs and nursing note reviewed.  Constitutional:      Appearance: Normal appearance. She is well-developed.  HENT:     Head: Normocephalic and atraumatic.  Eyes:     General: Lids are normal.     Conjunctiva/sclera: Conjunctivae normal.     Pupils: Pupils are equal, round, and reactive to light.  Neck:     Musculoskeletal: Full passive range of motion without pain.  Cardiovascular:     Rate and Rhythm: Normal rate and regular rhythm.     Pulses: Normal pulses.          Radial pulses are 2+ on the right side and 2+ on the left side.       Dorsalis pedis pulses are 2+ on the right side and 2+ on the left side.     Heart sounds: Normal heart sounds. No murmur. No friction rub. No gallop.    Pulmonary:     Effort: Pulmonary effort is normal.     Breath sounds: Wheezing and rales present.     Comments: Diffuse wheezing noted throughout all lung fields, most notably on the right upper lung fields.  Some mild crackles noted with no focal consolidation.  She is speaking in medium sentences.  Intermittently coughing. Abdominal:     Palpations: Abdomen is soft. Abdomen is not rigid.  Tenderness: There is no abdominal tenderness. There is no guarding.  Musculoskeletal: Normal range of motion.     Comments: Bilateral lower extremities are symmetric in appearance without any overlying warmth, erythema, edema.  Skin:    General: Skin is warm and dry.     Capillary Refill: Capillary refill takes less than 2 seconds.  Neurological:     Mental Status: She is alert and oriented to person, place, and time.  Psychiatric:        Speech: Speech normal.      ED Treatments / Results  Labs (all labs ordered are listed, but only abnormal results are displayed) Labs Reviewed  BASIC METABOLIC PANEL - Abnormal; Notable for the following components:      Result Value   Potassium 3.3 (*)    Chloride 86 (*)    CO2 36 (*)    Glucose, Bld 134 (*)    Creatinine, Ser 1.25 (*)    GFR calc non Af Amer 47 (*)    GFR calc Af Amer 54 (*)    All other components within normal limits  CBC WITH DIFFERENTIAL/PLATELET - Abnormal; Notable for the following components:   RBC 5.53 (*)    Hemoglobin 16.1 (*)    HCT 53.4 (*)    All other components within normal limits  BRAIN NATRIURETIC PEPTIDE - Abnormal; Notable for the following components:   B Natriuretic Peptide 909.6 (*)    All other components within normal limits  HEPATIC FUNCTION PANEL - Abnormal; Notable for the following components:   Total Protein 6.3 (*)    Albumin 3.4 (*)    All other components within normal limits  POCT I-STAT EG7 - Abnormal; Notable for the following components:   pH, Ven 7.441 (*)    pCO2, Ven 66.7 (*)    pO2,  Ven 110.0 (*)    Bicarbonate 45.4 (*)    TCO2 47 (*)    Acid-Base Excess 16.0 (*)    Sodium 132 (*)    Potassium 6.6 (*)    Calcium, Ion 0.98 (*)    HCT 54.0 (*)    Hemoglobin 18.4 (*)    All other components within normal limits  I-STAT CHEM 8, ED - Abnormal; Notable for the following components:   Chloride 87 (*)    BUN 27 (*)    Glucose, Bld 105 (*)    Calcium, Ion 1.07 (*)    TCO2 40 (*)    Hemoglobin 18.7 (*)    HCT 55.0 (*)    All other components within normal limits  TROPONIN I (HIGH SENSITIVITY) - Abnormal; Notable for the following components:   Troponin I (High Sensitivity) 37 (*)    All other components within normal limits  TROPONIN I (HIGH SENSITIVITY) - Abnormal; Notable for the following components:   Troponin I (High Sensitivity) 29 (*)    All other components within normal limits  SARS CORONAVIRUS 2 (HOSPITAL ORDER, [facility])  RAPID URINE DRUG SCREEN, HOSP PERFORMED  I-STAT VENOUS BLOOD GAS, ED    EKG EKG Interpretation  Date/Time:  Wednesday December 24 2018 08:44:52 EDT Ventricular Rate:  74 PR Interval:    QRS Duration: 104 QT Interval:  451 QTC Calculation: 501 R Axis:   109 Text Interpretation:  Sinus rhythm Prominent P waves, nondiagnostic RVH with secondary repolarization abnrm Abnormal T, consider ischemia, lateral leads No significant changes since last tracing, TW inv V2 new from prior Confirmed by Gareth [facility] [phone]) on  12/24/2018 8:52:33 AM   Radiology Dg Chest Port 1 View  Result Date: 12/24/2018 CLINICAL DATA:  Shortness of breath EXAM: PORTABLE CHEST 1 VIEW COMPARISON:  03/03/2018 FINDINGS: Cardiomegaly. Both lungs are clear. The visualized skeletal structures are unremarkable. IMPRESSION: Cardiomegaly without acute abnormality of the lungs in AP portable projection. Electronically Signed   By: Eddie Candle M.D.   On: 12/24/2018 09:58    Procedures Procedures (including critical care  time)  Medications Ordered in ED Medications  albuterol (VENTOLIN HFA) 108 (90 Base) MCG/ACT inhaler 6 puff (6 puffs Inhalation Given 12/24/18 1018)  methylPREDNISolone sodium succinate (SOLU-MEDROL) 125 mg/2 mL injection 125 mg (125 mg Intravenous Given 12/24/18 1018)  albuterol (VENTOLIN HFA) 108 (90 Base) MCG/ACT inhaler 2 puff (2 puffs Inhalation Given 12/24/18 1259)  furosemide (LASIX) injection 40 mg (40 mg Intravenous Given 12/24/18 1305)     Initial Impression / Assessment and Plan / ED Course  I have reviewed the triage vital signs and the nursing notes.  Pertinent labs & imaging results that were available during my care of the patient were reviewed by me and considered in my medical decision making (see chart for details).        58 year old female who presents for evaluation of 2 weeks of progressive worsening dyspnea.  History of CHF [place] she has been compliant with her medications.  Also history of COPD.  Has been using nebulizers with some improvement.  Reports some intermittent coughing with sputum production.  Had some chest pain that mostly sounded associated with coughing but did have an episode of sharp chest pain.  Initially arrival, she was afebrile but O2 sats dropped into 80s.  She was placed on 4 L O2 which improved her O2 sats.  Vitals otherwise stable.  On exam, she does have diffuse wheezing noted, particularly right side.  She is speaking medium sentences and intermittently coughing.  We will plan to give steroids, albuterol and reassess.  Concern for COPD exacerbation.  Doubt CHF exacerbation as she does not have any history of leg swelling.  Additionally, low suspicion for ACS etiology.  Also consider COVID-19. Doubt PE.   CBC shows no leukocytosis. Hgb stable at 16.1. BMP shows potassium of 3.3, chlorid eof 86, Bicarb of 36, creatinine 1.25.  Trop slightly elevated at 37.  Review of records show she has a history of troponin elevations.  Her baseline from  non-high-sensitivity troponins was 0.4.  I feel like this correlates with her high sensitive troponins.  Suspect this is most likely reflective of demand ischemia.  COVID test is negative.  Admission December 2019: admitted for 2 week history of weakness and dyspnea. Desat at 81% on RA. Diuresed 5.5 L.   Patient had improvement after nebulizers but then went to the bathroom without oxygen.  When she came back, she dropped down to sats between 60-70.  She continued to remain hypoxic and was placed back on 4 L O2 which improved her O2 sats.  She has no oxygen requirement at home.  Given hypoxia as well as concern for mixed COPD versus CHF exacerbation, I feel that admission would be warranted.  Discussed with family medicine.  They will agree to admit.  Portions of this note were generated with Lobbyist. Dictation errors may occur despite best attempts at proofreading.   Final Clinical Impressions(s) / ED Diagnoses   Final diagnoses:  COPD exacerbation ([place])  Acute on chronic congestive heart failure, unspecified heart failure type [facility])    ED  Discharge Orders    None       Desma Mcgregor 12/24/18 1440    Gareth [place], MD 12/25/18 2348

## 2018-12-24 NOTE — H&P (Signed)
[facility] Admission History and Physical Service Pager: [phone]  Patient name: [name] record number: [id number] Date of birth: 1960-05-25 Age: 58 y.o. Gender: female  Primary Care Provider: Shirley, [name], DO Consultants: None Code Status: DNR  Chief Complaint: Shortness of breath, cough  Assessment and Plan: Joy [name] is a 58 y.o. female presenting with shortness of breath and cough x2 weeks. PMH is significant for COPD, CHF, tobacco abuse, GERD, hypertension.  Hypoxic respiratory failure likely secondary to COPD exacerbation primarily with a contribution from HFpEF Patient with 2 weeks of progressive shortness of breath with a past medical history significant for COPD.  On admission O2 sat 82% on room air.  Chest x-ray showed cardiomegaly without acute abnormality of the lung.  Venous blood gas was significant for slightly elevated pH of 7.44, CO2 elevated at 66 and bicarb elevated at 45.4.  BNP was elevated at 909.6.  Patient was initially found to have an AKI with 1.25 creatinine, this has improved to 1.00 after receiving Lasix.  Current O2 sat 90% on 2 L nasal cannula. Etiology: Think this is likely secondary to COPD exacerbation as the patient has had an increase in her shortness of breath along with cough and sputum production.  Think less likely caused by her heart failure as she only has trace edema in her lower limbs, her chest x-ray is not significant for any lung findings suggestive of fluid overload, however, I do believe her heart failure is likely contributing to this in some form. The elevated BNP also supports this.  Chest x-ray negative for signs of pneumonia and the patient has been afebrile, further suggest against this type of infectious etiology.  Reassuringly patient's [id number] was negative as well.  Additionally, the patient did complain of a small amount of sharp chest pain during coughing, however reassuringly  high-sensitivity troponin levels were 37 and 29. -Admit to med telemetry, attending Dr. Nori Riis -Monitor respiratory status - 5 days azithro - Duonebs - prednisone 5 day burst -CBC/BMP for AM -Wean oxygen as able as treatment progresses -Cardiac monitoring, continuous pulse ox  Heart failure with preserved ejection fraction Last ECHO 02/2018 EF 60-65%.  Home medications include lisinopril, torsemide.  Patient chest x-ray without obvious signs of fluid overload on the lungs.  Patient with only trace edema in lower extremities. 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  On chart review she appears to have a hemoglobin in the normal range up until approximately May 2017, when her hemoglobin was noted to be 15.5.  Since then it seems to consistently be in the 16-18 range.  Patient does state that she snores when sleeping and does feel fatigued throughout the day. -Repeat CBC AM -Consider sleep study as outpatient to look for OSA  GERD Patient not currently on medication for this, chart review reveals she used to take omeprazole 20 for GERD in the past. -Continue to monitor, can consider adding PPI if patient experiences GERD symptoms  Pulmonary hypertension Noted on echo in 2017 with pulmonary artery pressure of 72 mmHg, this had decreased to 61 mmHg on repeat echo in 2019, reference range  <30. -Continue to monitor  Tobacco abuse States she has cut down on her smoking and now is currently smokes 0.5-1 pack/day. -Nicotine patch provided -Recommend tobacco cessation  Hypertension Blood pressures  during this admission ranging from 100-144/70-95.  Most recent blood pressure 123/93.  Home medications include lisinopril. -Hold home lisinopril due to AKI -Continue to monitor  FEN/GI: Heart healthy Prophylaxis:  Lovenox  Disposition: Pending medical work-up  History of Present Illness:  Joy [name] is a 58 y.o. female presenting with shortness of breath x2 weeks.  Patient states that for the last week or 2 she has noted an increase in her shortness of breath.  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  She states that yesterday at work people started noticing and commented on her shortness of breath expressing concern for her.  She told him she felt okay but today she felt worse prompting her to come into the emergency department.  Of note: She ran out of BP pills in August.  She reports that she also takes a fluid pill but she has plenty of them.  She denies any change in medication recently, or running out of any other medications besides her blood pressure pills.  She specifically admits to malaise and fatigue, as well as cough with sputum production.  She denies nausea, vomiting, abdominal pain, diarrhea, constipation, blood in her stool, dysuria, myalgias, chest pain, blurred vision, congestion or sore throat.  In the emergency department, she was initially noted with O2 sats in the 80s.  This improved after going on 4L nasal cannula.  Patient admits to drinking a few beers on the weekends, she does smoke 0.5-1 pack/day.  She does admit to smoking marijuana "as often as I can", she denies other illicit drugs.  Review Of Systems: Per HPI with the following additions:   Review of Systems  Constitutional: Positive for malaise/fatigue. Negative for fever.  HENT: Negative for congestion and sore throat.   Eyes: Negative for blurred vision.  Respiratory: Positive for cough, sputum production, shortness of breath and wheezing. Negative for hemoptysis.   Cardiovascular: Negative for chest pain.  Gastrointestinal: Negative for abdominal pain, blood in stool, constipation, diarrhea, nausea and vomiting.   Genitourinary: Negative for dysuria.  Musculoskeletal: Negative for myalgias.  Neurological: Negative for dizziness.    Patient Active Problem List   Diagnosis Date Noted  . COPD (chronic obstructive pulmonary disease) ([place]) 08/10/2015  . Pulmonary hypertension ([place])   . Congestive heart failure ([place]) 08/08/2015  . Essential hypertension   . Tobacco abuse 05/16/2006  . GASTROESOPHAGEAL REFLUX, NO ESOPHAGITIS 05/16/2006    Past Medical History: Past Medical History:  Diagnosis Date  . Acute congestive heart failure ([place])   . Acute dyspnea   . Acute on chronic diastolic CHF (congestive heart failure) ([place]) 08/11/2015  . Acute right heart failure ([place]) 08/11/2015  . Bilateral lower extremity edema   . CHF (congestive heart failure) ([place])   . COPD (chronic obstructive pulmonary disease) ([place]) 08/10/2015   Dx on XRAY 08/08/15.  Needs PFTs   . GASTROESOPHAGEAL REFLUX, NO ESOPHAGITIS 05/16/2006   Qualifier: Diagnosis of  By: Drucie Ip    . GERD (gastroesophageal reflux disease)   . Hypertension   . Hypervolemia   . Leg swelling hospitalized 08/08/2015   BLE  . PELVIC MASS 07/05/2008   Qualifier: Diagnosis of  By: McDiarmid MD, Sherren Mocha    . Pulmonary hypertension ([place])   . Right heart failure ([place]) 08/10/2015  . Tobacco abuse 05/16/2006   Qualifier: Diagnosis of  By: Drucie Ip  Past Surgical History: Past Surgical History:  Procedure Laterality Date  . TUBAL LIGATION      Social History: Social History   Tobacco Use  . Smoking status: Current Every Day Smoker    Packs/day: 0.15    Years: 36.00    Pack years: 5.40    Types: Cigarettes  . Smokeless tobacco: Former Systems developer    Types: Chew  . Tobacco comment: "quit chewing in my early '20s"  Substance Use Topics  . Alcohol use: Yes    Alcohol/week: 23.0 standard drinks    Types: 12 Cans of beer, 11 Shots of liquor per week  . Drug use: Yes    Types: Marijuana    Comment: 08/08/2015 "I smoke marijuana when I have  it; probably a couple times/week"   Additional social history: See above Please also refer to relevant sections of EMR.  Family History: No family history on file.  Allergies and Medications: No Known Allergies No current facility-administered medications on file prior to encounter.    Current Outpatient Medications on File Prior to Encounter  Medication Sig Dispense Refill  . acetaZOLAMIDE (DIAMOX) 250 MG tablet Take 1 tablet (250 mg total) by mouth daily. 30 tablet 5  . albuterol (PROVENTIL HFA;VENTOLIN HFA) 108 (90 Base) MCG/ACT inhaler Inhale 2 puffs into the lungs every 6 (six) hours as needed for wheezing or shortness of breath. (Patient not taking: Reported on 03/03/2018) 1 Inhaler 2  . lisinopril (ZESTRIL) 5 MG tablet TAKE 1 TABLET BY MOUTH EVERY DAY 30 tablet 0  . Multiple Vitamin (MULTIVITAMIN WITH MINERALS) TABS tablet Take 1 tablet by mouth daily.    Marland Kitchen torsemide (DEMADEX) 20 MG tablet TAKE 1 TABLET BY MOUTH EVERY DAY 30 tablet 0    Objective: BP (!) 123/93   Pulse 73   Temp 97.8 F (36.6 C) (Oral)   Resp 19   Ht 5' 6" (1.676 m)   Wt 71 kg   SpO2 90%   BMI 25.26 kg/m  Exam: General: Alert and oriented, no apparent distress, nasal cannula present  Eyes: PERRLA ENTM: No pharyengeal erythema Cardiovascular: RRR with no murmurs noted Respiratory: Rales in RLL, some fine crackles in lower lobes. No wheezing  Gastrointestinal: Bowel sounds present. No abdominal pain Derm: No rashes noted Extremities: Trace edema in lower limbs Psych: Behavior and speech appropriate to situation  Labs and Imaging: CBC BMET  Recent Labs  Lab 12/24/18 0843  12/24/18 1304  WBC 4.9  --   --   HGB 16.1*   < > 18.7*  HCT 53.4*   < > 55.0*  PLT 281  --   --    < > = values in this interval not displayed.   Recent Labs  Lab 12/24/18 0843  12/24/18 1304  NA 136   < > 136  K 3.3*   < > 3.6  CL 86*  --  87*  CO2 36*  --   --   BUN 20  --  27*  CREATININE 1.25*  --  1.00   GLUCOSE 134*  --  105*  CALCIUM 9.0  --   --    < > = values in this interval not displayed.       Lurline Del, DO 12/24/2018, 4:34 PM PGY-1, [place] Intern pager: [phone], text pages welcome

## 2018-12-24 NOTE — ED Triage Notes (Signed)
Patient states she has had SOB for two weeks associated with a headache. Patient stated her SOB is present whether at rest or exertion. When the patient coughs, significant crackles and rales are audible without a stethoscope.

## 2018-12-24 NOTE — Progress Notes (Signed)
[name] [id number] Admission Data: 12/24/2018 7:46 PM Attending Provider: Dickie La, MD  HFW:YOVZCHY, Joy [name] Consults/ Treatment Team:   Joy [name] is a 58 y.o. female patient admitted from ED awake, alert  & orientated  X 3,  DNR, VSS - Blood pressure 121/78, pulse 82, temperature 97.8 F (36.6 C), temperature source Oral, resp. rate 18, height _0  (1.676 m), weight 71 kg, SpO2 98 %., O2    2 L nasal cannular, no c/o shortness of breath, no c/o chest pain, no distress noted. Tele # 22 placed and pt is currently running:normal sinus rhythm.   IV site WDL:  antecubital left, condition patent and no redness with a transparent dsg that's clean dry and intact.  Allergies:  No Known Allergies   Past Medical History:  Diagnosis Date  . Acute congestive heart failure ([place])   . Acute dyspnea   . Acute on chronic diastolic CHF (congestive heart failure) ([place]) 08/11/2015  . Acute right heart failure ([place]) 08/11/2015  . Bilateral lower extremity edema   . CHF (congestive heart failure) ([place])   . COPD (chronic obstructive pulmonary disease) ([place]) 08/10/2015   Dx on XRAY 08/08/15.  Needs PFTs   . GASTROESOPHAGEAL REFLUX, NO ESOPHAGITIS 05/16/2006   Qualifier: Diagnosis of  By: Drucie Ip    . GERD (gastroesophageal reflux disease)   . Hypertension   . Hypervolemia   . Leg swelling hospitalized 08/08/2015   BLE  . PELVIC MASS 07/05/2008   Qualifier: Diagnosis of  By: McDiarmid MD, Sherren Mocha    . Pulmonary hypertension ([place])   . Right heart failure ([place]) 08/10/2015  . Tobacco abuse 05/16/2006   Qualifier: Diagnosis of  By: Drucie Ip      History:  obtained from ED nurse. Tobacco/alcohol:Smoker/ none  Pt orientation to unit, room and routine. Information packet given to patient/family and safety video watched.  Admission INP armband ID verified with patient/family, and in place. SR up x 2, fall risk assessment complete with Patient and family verbalizing understanding of risks  associated with falls. Pt verbalizes an understanding of how to use the call bell and to call for help before getting out of bed.  Skin, clean-dry- intact without evidence of bruising, or skin tears.   No evidence of skin break down noted on exam. no rashes, no ecchymoses, no petechiae, no nodules, no jaundice, no purpura, no wounds    Will cont to monitor and assist as needed. Cathlean Marseilles Tamala Julian, MSN, RN, [place], [place]  12/24/2018 7:46 PM

## 2018-12-24 NOTE — ED Triage Notes (Addendum)
Patient complains of 2 weeks of weakness with SOB. Found to have oxygen sats in 80s on arrival. Denies CP. Reports that she has had diaphoresis with same. Placed on oxygen at 4L

## 2018-12-25 ENCOUNTER — Observation Stay (HOSPITAL_BASED_OUTPATIENT_CLINIC_OR_DEPARTMENT_OTHER): Payer: Self-pay

## 2018-12-25 DIAGNOSIS — R0602 Shortness of breath: Secondary | ICD-10-CM

## 2018-12-25 DIAGNOSIS — I5033 Acute on chronic diastolic (congestive) heart failure: Secondary | ICD-10-CM

## 2018-12-25 DIAGNOSIS — I279 Pulmonary heart disease, unspecified: Secondary | ICD-10-CM

## 2018-12-25 LAB — CBC
HCT: 53.8 % — ABNORMAL HIGH (ref 36.0–46.0)
Hemoglobin: 16.5 g/dL — ABNORMAL HIGH (ref 12.0–15.0)
MCH: 29.2 pg (ref 26.0–34.0)
MCHC: 30.7 g/dL (ref 30.0–36.0)
MCV: 95.2 fL (ref 80.0–100.0)
Platelets: 309 10*3/uL (ref 150–400)
RBC: 5.65 MIL/uL — ABNORMAL HIGH (ref 3.87–5.11)
RDW: 14.7 % (ref 11.5–15.5)
WBC: 5.9 10*3/uL (ref 4.0–10.5)
nRBC: 0 % (ref 0.0–0.2)

## 2018-12-25 LAB — POCT I-STAT EG7
Acid-Base Excess: 16 mmol/L — ABNORMAL HIGH (ref 0.0–2.0)
Bicarbonate: 45.4 mmol/L — ABNORMAL HIGH (ref 20.0–28.0)
Calcium, Ion: 0.98 mmol/L — ABNORMAL LOW (ref 1.15–1.40)
HCT: 54 % — ABNORMAL HIGH (ref 36.0–46.0)
Hemoglobin: 18.4 g/dL — ABNORMAL HIGH (ref 12.0–15.0)
O2 Saturation: 98 %
Potassium: 6.6 mmol/L (ref 3.5–5.1)
Sodium: 132 mmol/L — ABNORMAL LOW (ref 135–145)
TCO2: 47 mmol/L — ABNORMAL HIGH (ref 22–32)
pCO2, Ven: 66.7 mmHg — ABNORMAL HIGH (ref 44.0–60.0)
pH, Ven: 7.441 — ABNORMAL HIGH (ref 7.250–7.430)
pO2, Ven: 110 mmHg — ABNORMAL HIGH (ref 32.0–45.0)

## 2018-12-25 LAB — BASIC METABOLIC PANEL
Anion gap: 14 (ref 5–15)
BUN: 18 mg/dL (ref 6–20)
CO2: 39 mmol/L — ABNORMAL HIGH (ref 22–32)
Calcium: 9 mg/dL (ref 8.9–10.3)
Chloride: 86 mmol/L — ABNORMAL LOW (ref 98–111)
Creatinine, Ser: 0.87 mg/dL (ref 0.44–1.00)
GFR calc Af Amer: >60 mL/min (ref >60)
GFR calc non Af Amer: >60 mL/min (ref >60)
Glucose, Bld: 112 mg/dL — ABNORMAL HIGH (ref 70–99)
Potassium: 3.1 mmol/L — ABNORMAL LOW (ref 3.5–5.1)
Sodium: 139 mmol/L (ref 135–145)

## 2018-12-25 LAB — ECHOCARDIOGRAM COMPLETE
Height: 66 in
Weight: 2504.43 oz

## 2018-12-25 MED ORDER — POTASSIUM CHLORIDE CRYS ER 20 MEQ PO TBCR
40.0000 meq | EXTENDED_RELEASE_TABLET | Freq: Once | ORAL | Status: AC
  Administered 2018-12-25: 08:00:00 40 meq via ORAL
  Filled 2018-12-25: qty 2

## 2018-12-25 MED ORDER — POTASSIUM CHLORIDE CRYS ER 20 MEQ PO TBCR
40.0000 meq | EXTENDED_RELEASE_TABLET | Freq: Once | ORAL | Status: AC
  Administered 2018-12-25: 18:00:00 40 meq via ORAL
  Filled 2018-12-25: qty 2

## 2018-12-25 MED ORDER — SPIRONOLACTONE 25 MG PO TABS
25.0000 mg | ORAL_TABLET | Freq: Every day | ORAL | Status: DC
  Administered 2018-12-25 – 2018-12-27 (×3): 25 mg via ORAL
  Filled 2018-12-25 (×3): qty 1

## 2018-12-25 MED ORDER — FUROSEMIDE 10 MG/ML IJ SOLN: 80.0000 mg | Freq: Once | INTRAMUSCULAR | Status: DC

## 2018-12-25 MED ORDER — FUROSEMIDE 10 MG/ML IJ SOLN
80.0000 mg | Freq: Once | INTRAMUSCULAR | Status: AC
  Administered 2018-12-25: 18:00:00 80 mg via INTRAVENOUS
  Filled 2018-12-25: qty 8

## 2018-12-25 MED ORDER — POTASSIUM CHLORIDE CRYS ER 20 MEQ PO TBCR
40.0000 meq | EXTENDED_RELEASE_TABLET | Freq: Once | ORAL | Status: AC
  Administered 2018-12-25: 40 meq via ORAL
  Filled 2018-12-25 (×2): qty 2

## 2018-12-25 MED ORDER — FUROSEMIDE 10 MG/ML IJ SOLN
80.0000 mg | Freq: Once | INTRAMUSCULAR | Status: AC
  Administered 2018-12-26: 80 mg via INTRAVENOUS
  Filled 2018-12-25: qty 8

## 2018-12-25 MED ORDER — AZITHROMYCIN 500 MG PO TABS
500.0000 mg | ORAL_TABLET | Freq: Every day | ORAL | Status: DC
  Administered 2018-12-25 – 2018-12-27 (×3): 500 mg via ORAL
  Filled 2018-12-25 (×3): qty 1

## 2018-12-25 MED ORDER — UMECLIDINIUM BROMIDE 62.5 MCG/INH IN AEPB
1.0000 | INHALATION_SPRAY | Freq: Every day | RESPIRATORY_TRACT | Status: DC
  Administered 2018-12-26 – 2018-12-27 (×2): 1 via RESPIRATORY_TRACT
  Filled 2018-12-25: qty 7

## 2018-12-25 NOTE — TOC Initial Note (Signed)
Transition of Care [facility]) - Initial/Assessment Note    Patient Details  Name: Joy [name] MRN: [id number] Date of Birth: [birth year]  [facility] CM/SW Contact:    Benard Halsted, LCSW Phone Number: 12/25/2018, 3:17 PM  Clinical Narrative:                 CSW assisting to set up oxygen for patient with no insurance. [employer] will be able to complete referral once DME order is in (continuous, 2L nasal cannula).   Expected Discharge Plan: Home/Self Care Barriers to Discharge: Continued Medical Work up   Patient Goals and CMS Choice Patient states their goals for this hospitalization and ongoing recovery are:: Return home CMS Medicare.gov Compare Post Acute Care list provided to:: Patient    Expected Discharge Plan and Services Expected Discharge Plan: Home/Self Care In-house Referral: Clinical Social Work Discharge Planning Services: CM Consult Post Acute Care Choice: Durable Medical Equipment Living arrangements for the past 2 months: Single Family Home                 DME Arranged: Oxygen DME Agency: [employer] Date DME Agency Contacted: 12/25/18 Time DME Agency Contacted: [id number] Representative spoke with at DME Agency: [place]: NA [place] Agency: NA        Prior Living Arrangements/Services Living arrangements for the past 2 months: [place] with:: Self Patient language and need for interpreter reviewed:: Yes Do you feel safe going back to the place where you live?: Yes      Need for Family Participation in Patient Care: No (Comment) Care giver support system in place?: No (comment)   Criminal Activity/Legal Involvement Pertinent to Current Situation/Hospitalization: No - Comment as needed  Activities of Daily Living      Permission Sought/Granted Permission sought to share information with : Customer service manager                Emotional Assessment Appearance:: Appears older than stated  age Attitude/Demeanor/Rapport: Other (comment)(Appropriate) Affect (typically observed): Flat, Accepting, Appropriate Orientation: : Oriented to Self, Oriented to Place, Oriented to  Time, Oriented to Situation Alcohol / Substance Use: Not Applicable Psych Involvement: No (comment)  Admission diagnosis:  SOB (shortness of breath) [R06.02] COPD exacerbation (HCC) [J44.1] Acute on chronic congestive heart failure, unspecified heart failure type ([place]) [I50.9] Patient Active Problem List   Diagnosis Date Noted  . COPD (chronic obstructive pulmonary disease) ([place]) 08/10/2015  . Pulmonary hypertension ([place])   . Congestive heart failure ([place]) 08/08/2015  . Essential hypertension   . Tobacco abuse 05/16/2006  . GASTROESOPHAGEAL REFLUX, NO ESOPHAGITIS 05/16/2006   PCP:  Shirley, [name], DO Pharmacy:   [id number] [id number]: [phone]: [phone]    Social Determinants of Health (SDOH) Interventions    Readmission Risk Interventions No flowsheet data found.

## 2018-12-25 NOTE — Consult Note (Addendum)
°  °Advanced Heart Failure Team Consult Note ° ° °Primary Physician: Shirley, Jordan, DO °PCP-Cardiologist:  Seen remotely by Dr. Hilty in 2017 (no clinic f/u) °[facility]: New (Dr. Corvin Sorbo) ° °Reason for Consultation: HFpEF / Acute on Chronic diastolic HF and pulmonary HTN ° °HPI:   ° °Joy [name] is being seen today for evaluation of HFpEF and pulmonary HTN at the request of Dr. Neal, Family Medicine.  ° °She is a 58 y/o AA female smoker w/ COPD, pulmonary nodule (4 mm LLL by CT in 2015 and 2017), chronic diastolic HF, RV failure, pulmonary HTN and essential HTN. Does not get regular preventative care due to lack of insurance.  ° ° She was first admitted to [facility] in 07/2015 w/ LEE, abdominal bloating and DOE and diagnosed w/ acute CHF. BNP that admit was 548. 2D echo showed normal EF at 60-65% w/ G1DD, mildly reduced RV systolic function, moderately to severely dilated RA and severely increased PA systolic pressure at 72 mm Hg. Chest CT was negative for PE but did show stable LLL pulmonary nodule measuring at 4 mm and unchanged from previous scan in 2015. She was seen by general cardiology. She was diuresed w/ IV Lasix w/ symptomatic improvement and improvement in volume. It was recommended that she be considered for outpatient RHC given pulmonary HTN and also advised that she be considered for outpatient sleep study evaluation given h/o snoring to r/o OSA, but pt was lost to f/u. It appears she did have a repeat echo in 02/2018 that showed EF at 60-65%, G2DD. RV moderately reduced and PA systolic pressure was 61 mmHg.  ° °She presented to the [facility] ED on 10/7 w/ complaints of SOB and productive cough x 2 weeks (white sputum). Also had an episode of SSCP last week, occurring at rest and associated w/ wheezing. In the ED, she was hypoxic w/ O2 sats in the 80 and was placed on [place] 4L/min w/ improvement in saturations. COVID negative. CXR showed cardiomegaly w/o acute abnormality. BNP 909 (higher that prior baseline). Hs  troponin 37>>29. SCr 1.00. K 3.6 on admit. WBC 5.9. Hgb 18.4.     ° °She was admitted by family medicine for acute hypoxic respiratory failure, felt to be multifactorial, 2/2 acute COPD exacerbation and acute on chronic diastolic HF. She was started on azithromycin, prednisone and inhalers. Got 1 x dose of IV lasix, 40 mg in the ED and placed back on PO torsemide today. Repeat 2D echo ordered and pending. AHF consulted for further assistance w/ diastolic HF and pulmonary HTN. ° °She is currently comfortable at rest. Still w/ productive cough. No current wheezing. No current CP. Hasn't ambulated much since admission. Still smokes ~1/2 ppd. 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 Up until 2 weeks ago, was able to do ADLs and basic job duties w/o significant exertional dyspnea. She report being compliant w/ her "water pill" but ran out of her blood pressure pills last month. BP while in the ED was 121/79.  ° ° ° °Echo 02/2018 °Study Conclusions °  °- Left ventricle: The cavity size was normal. There was moderate °  concentric hypertrophy. Systolic function was normal. The °  estimated ejection fraction was in the range of 60% to 65%. Wall °  motion was normal; there were no regional wall motion °  abnormalities. Features are consistent with a pseudonormal left °  ventricular filling pattern, with concomitant abnormal relaxation °  and increased filling pressure (grade 2 diastolic dysfunction). °    Doppler parameters are consistent with elevated ventricular °  end-diastolic filling pressure. °- Mitral valve: There was mild regurgitation. °- Right ventricle: The cavity size was moderately dilated. Wall °  thickness was normal. Systolic function was moderately reduced. °- Right atrium: The atrium was moderately dilated. °- Tricuspid valve: There was mild regurgitation. °- Pulmonary arteries: Systolic pressure was moderately increased. °  PA peak pressure: 61 mm Hg (S). °- Inferior vena cava: The vessel was dilated.  The respirophasic °  diameter changes were blunted (< 50%), consistent with elevated °  central venous pressure. °- Pericardium, extracardiac: A trivial pericardial effusion was °  identified posterior to the heart. °  °Impressions: °  °- There has been no significant change since the prior study on °  08/09/2015. ° ° °Review of Systems: [y] = yes, [ ] = no  ° °• General: Weight gain [ ]; Weight loss [ ]; Anorexia [ ]; Fatigue [ ]; Fever [ ]; Chills [ ]; Weakness [ ]  °• Cardiac: Chest pain/pressure [Y ]; Resting SOB [ ]; Exertional SOB [ Y]; Orthopnea [ ]; Pedal Edema [ ]; Palpitations [ ]; Syncope [ ]; Presyncope [ ]; Paroxysmal nocturnal dyspnea[ ]  °• Pulmonary: Cough [ Y]; Wheezing[Y ]; Hemoptysis[ ]; Sputum [Y ]; Snoring [Y ]  °• GI: Vomiting[ ]; Dysphagia[ ]; Melena[ ]; Hematochezia [ ]; Heartburn[ ]; Abdominal pain [ ]; Constipation [ ]; Diarrhea [ ]; BRBPR [ ]  °• GU: Hematuria[ ]; Dysuria [ ]; Nocturia[ ]  °• Vascular: Pain in legs with walking [ ]; Pain in feet with lying flat [ ]; Non-healing sores [ ]; Stroke [ ]; TIA [ ]; Slurred speech [ ];  °• Neuro: Headaches[ ]; Vertigo[ ]; Seizures[ ]; Paresthesias[ ];Blurred vision [ ]; Diplopia [ ]; Vision changes [ ]  °• Ortho/Skin: Arthritis [ ]; Joint pain [ ]; Muscle pain [ ]; Joint swelling [ ]; Back Pain [ ]; Rash [ ]  °• Psych: Depression[ ]; Anxiety[ ]  °• Heme: Bleeding problems [ ]; Clotting disorders [ ]; Anemia [ ]  °• Endocrine: Diabetes [ ]; Thyroid dysfunction[ ] ° °Home Medications °Prior to Admission medications   °Medication Sig Start Date End Date Taking? Authorizing Provider  °albuterol (PROVENTIL HFA;VENTOLIN HFA) 108 (90 Base) MCG/ACT inhaler Inhale 2 puffs into the lungs every 6 (six) hours as needed for wheezing or shortness of breath. 07/04/16  Yes Parker, Caleb M, MD  °lisinopril (ZESTRIL) 5 MG tablet TAKE 1 TABLET BY MOUTH EVERY DAY °Patient taking differently: Take 5 mg by mouth daily.  12/23/18  Yes Shirley, Jordan, DO  °Multiple  Vitamin (MULTIVITAMIN WITH MINERALS) TABS tablet Take 1 tablet by mouth daily.   Yes [provider]  °torsemide (DEMADEX) 20 MG tablet TAKE 1 TABLET BY MOUTH EVERY DAY °Patient taking differently: Take 20 mg by mouth daily.  12/01/18  Yes Shirley, Jordan, DO  °acetaZOLAMIDE (DIAMOX) 250 MG tablet Take 1 tablet (250 mg total) by mouth daily. °Patient not taking: Reported on 12/24/2018 11/05/18   Shirley, Jordan, DO  ° ° °Past Medical History: °Past Medical History:  °Diagnosis Date  °• Acute congestive heart failure (HCC)   °• Acute dyspnea   °• Acute on chronic diastolic CHF (congestive heart failure) (HCC) 08/11/2015  °• Acute right heart failure (HCC) 08/11/2015  °• Bilateral lower extremity edema   °• CHF (congestive heart failure) (HCC)   °• COPD (chronic obstructive pulmonary disease) (HCC) 08/10/2015  ° Dx on XRAY 08/08/15.    Needs PFTs   °• GASTROESOPHAGEAL REFLUX, NO ESOPHAGITIS 05/16/2006  ° Qualifier: Diagnosis of  By: Kivett, Whitney    °• GERD (gastroesophageal reflux disease)   °• Hypertension   °• Hypervolemia   °• Leg swelling hospitalized 08/08/2015  ° BLE  °• PELVIC MASS 07/05/2008  ° Qualifier: Diagnosis of  By: McDiarmid MD, Todd    °• Pulmonary hypertension (HCC)   °• Right heart failure (HCC) 08/10/2015  °• Tobacco abuse 05/16/2006  ° Qualifier: Diagnosis of  By: Kivett, Whitney    ° ° °Past Surgical History: °Past Surgical History:  °Procedure Laterality Date  °• TUBAL LIGATION    ° ° °Family History: °No family history on file. ° °Social History: °Social History  ° °Socioeconomic History  °• Marital status: Married  °  Spouse name: Not on file  °• Number of children: Not on file  °• Years of education: Not on file  °• Highest education level: Not on file  °Occupational History  °• Not on file  °Social Needs  °• Financial resource strain: Not on file  °• Food insecurity  °  Worry: Not on file  °  Inability: Not on file  °• Transportation needs  °  Medical: Not on file  °  Non-medical: Not on file    °Tobacco Use  °• Smoking status: Current Every Day Smoker  °  Packs/day: 0.15  °  Years: 36.00  °  Pack years: 5.40  °  Types: Cigarettes  °• Smokeless tobacco: Former User  °  Types: Chew  °• Tobacco comment: "quit chewing in my early '20s"  °Substance and Sexual Activity  °• Alcohol use: Yes  °  Alcohol/week: 23.0 standard drinks  °  Types: 12 Cans of beer, 11 Shots of liquor per week  °• Drug use: Yes  °  Types: Marijuana  °  Comment: 08/08/2015 "I smoke marijuana when I have it; probably a couple times/week"  °• Sexual activity: Yes  °Lifestyle  °• Physical activity  °  Days per week: Not on file  °  Minutes per session: Not on file  °• Stress: Not on file  °Relationships  °• Social connections  °  Talks on phone: Not on file  °  Gets together: Not on file  °  Attends religious service: Not on file  °  Active member of club or organization: Not on file  °  Attends meetings of clubs or organizations: Not on file  °  Relationship status: Not on file  °Other Topics Concern  °• Not on file  °Social History Narrative  °• Not on file  ° ° °Allergies:  °No Known Allergies ° °Objective:   ° °Vital Signs:   °Temp:  [97.8 °F (36.6 °C)-98.4 °F (36.9 °C)] 98 °F (36.7 °C) (10/08 1347) °Pulse Rate:  [57-82] 72 (10/08 1347) °Resp:  [14-18] 18 (10/08 1347) °BP: (102-125)/(65-82) 102/65 (10/08 1347) °SpO2:  [95 %-100 %] 100 % (10/08 1347) °Last BM Date: 12/24/18 ° °Weight change: °Filed Weights  ° 12/24/18 0851  °Weight: 71 kg  ° ° °Intake/Output:  ° °Intake/Output Summary (Last 24 hours) at 12/25/2018 1604 °Last data filed at 12/25/2018 0900 °Gross per 24 hour  °Intake 670 ml  °Output --  °Net 670 ml  °  ° ° °Physical Exam  °  °General:  Well appearing. No resp difficulty °HEENT: normal °Neck: elevated JVD . Carotids 2+ bilat; no bruits. No lymphadenopathy or thyromegaly appreciated. °Cor: PMI nondisplaced. Regular rate &   rhythm. S4 gallop  °Lungs: LLL crackles, otherwise CTA °Abdomen: abdominal edema, soft, nontender,  nondistended. No hepatosplenomegaly. No bruits or masses. Good bowel sounds. °Extremities: no cyanosis, clubbing, rash, edema °Neuro: alert & orientedx3, cranial nerves grossly intact. moves all 4 extremities w/o difficulty. Affect pleasant ° ° °Telemetry  ° °NSR 70s  ° °EKG  °  °NSR, 74 bpm  normal axis. LAE, RAE, ? RHV, inferior and lateral TWIs, no significant change from previous ° °Labs  ° °Basic Metabolic Panel: °Recent Labs  °Lab 12/24/18 °0843 12/24/18 °1113 12/24/18 °1304 12/25/18 °0252  °NA 136 132* 136 139  °K 3.3* 6.6* 3.6 3.1*  °CL 86*  --  87* 86*  °CO2 36*  --   --  39*  °GLUCOSE 134*  --  105* 112*  °BUN 20  --  27* 18  °CREATININE 1.25*  --  1.00 0.87  °CALCIUM 9.0  --   --  9.0  ° ° °Liver Function Tests: °Recent Labs  °Lab 12/24/18 °1106  °AST 29  °ALT 23  °ALKPHOS 67  °BILITOT 0.9  °PROT 6.3*  °ALBUMIN 3.4*  ° °No results for input(s): LIPASE, AMYLASE in the last 168 hours. °No results for input(s): AMMONIA in the last 168 hours. ° °CBC: °Recent Labs  °Lab 12/24/18 °0843 12/24/18 °1113 12/24/18 °1304 12/25/18 °0252  °WBC 4.9  --   --  5.9  °NEUTROABS 2.7  --   --   --   °HGB 16.1* 18.4* 18.7* 16.5*  °HCT 53.4* 54.0* 55.0* 53.8*  °MCV 96.6  --   --  95.2  °PLT 281  --   --  309  ° ° °Cardiac Enzymes: °No results for input(s): CKTOTAL, CKMB, CKMBINDEX, TROPONINI in the last 168 hours. ° °BNP: °BNP (last 3 results) °Recent Labs  °  03/03/18 °1800 12/24/18 °0843  °BNP 450.1* 909.6*  ° ° °ProBNP (last 3 results) °No results for input(s): PROBNP in the last 8760 hours. ° ° °CBG: °No results for input(s): GLUCAP in the last 168 hours. ° °Coagulation Studies: °No results for input(s): LABPROT, INR in the last 72 hours. ° ° °Imaging  ° °· No results found. ° ° °Medications:   ° ° °Current Medications: °• azithromycin  500 mg Oral Daily  °• enoxaparin (LOVENOX) injection  40 mg Subcutaneous Q24H  °• nicotine  7 mg Transdermal Daily  °• predniSONE  40 mg Oral Q breakfast  °• torsemide  20 mg Oral Daily  °•  umeclidinium bromide  1 puff Inhalation Daily  °·  ° °Infusions: ° ° °Patient Profile  ° °58 y/o AA female smoker w/ COPD, pulmonary nodule (4 mm LLL by CT in 2015 and 2017), chronic diastolic HF, RV failure, pulmonary HTN and essential HTN, admitted for acute hypoxic respiratory failure, 2/2 acute COPDE, a/c diastolic HF w/ RV failure and pulmonary HTN.  ° °Assessment/Plan  ° °1. Acute Hypoxic Respiratory Failure: presented w/ 2 week history of exertional dyspnea and productive cough. Hypoxic on arrival w/ O2 sats in the 80s. Improved w/ supplemental O2.  COVID negative. CXR w/ cardiomegaly but no infiltrate. Mild edema. BNP elevated at 909. Hs troponin minimally elevated w/ flat trend not c/w ACS.  °-Suspect multifactorial 2/2 acute COPDE, a/c dCHF and pulmonary HTN ° °2. Acute on Chronic Diastolic HF: BNP 909 on admit. CXR w/ mild edema. Prior echo in 02/2018 showed G2DD and normal LVEF, 60-65%. Exam + for elevated JVD, abdominal edema and LLL crackles. SCr ok °-needs additional   diuresis w/ IV diuretics °- stop PO torsemide °-add IV Lasix 80 mg bid °-Add spironolactone 25 mg daily °-Supplement K °- strict I/Os + daily weights  °- f/u BMP in the AM °-R/LHC in the AM  ° °3. 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 Perhaps combination of WHO Group 2 w/ chronic diastolic HF. Prior echocardiograms showed severely elevated PA pressures in the 60s-70s mmHg. Repeat echo pending.  °-Continue w/ supplemental O2 °-Continue w/ Diuretics °-Check PFTs °-Plan RHC tomorrow °-Would benefit from outpatient sleep study  ° °4. Chest Pain: pt w/ recent SSCP described as tightness. Has CRFs for CAD. Hs troponin 37>>29, not c/w ACS, likely demand ischemia from hypoxia and a/c dCHF.  °-Given symptoms and CRFs, will plan LHC in the AM °NPO at midnight  ° °5. Tobacco Abuse: smoking cessation advised ° °6. Hypokalemia: K 3.1 °-  Will give 40 mEq of Kdur now  and an additional dose of 40 mEq tonight w/ dose of IV Lasix.  °-Add spiro 25 mg daily °-F/u BMP in the AM.  ° °7. Pulmonary Nodule: °- 4 mm LLL nodule on chest CT in 2017 °-active smoker °-defer further w/u to primary team  ° ° °Length of Stay: 0 ° °Brittainy Simmons, PA-C  °12/25/2018, 4:04 PM ° °Advanced Heart Failure Team °Pager [phone] (M-F; 7a - 4p)  °Please contact [facility] for night-coverage after hours (4p -7a ) and weekends on [email or web address] ° °Patient seen and examined with the above-signed Advanced Practice Provider and/or Housestaff. I personally reviewed laboratory data, imaging studies and relevant notes. I independently examined the patient and formulated the important aspects of the plan. I have edited the note to reflect any of my changes or salient points. I have personally discussed the plan with the patient and/or family. ° °58 y/o woman with COPD and ongoing tobacco use, diastolic HF admitted with recurrent hypoxemic respiratory failure. BNP elevated. ABG with evidence of chronic CO2 retention. + intermittent CP.  ° °Echo reviewed personally LVEF 60-65% +grade 2 DD. RV moderately dialted with moderately decrease RV function. Septal flattening c/w RV pressure volume overload.  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 Will continue diuresis overnight. Plan R/L cath in am to further evaluate PH and CP. Will need PFTs with DLCO. Counseled on need for smoking cessation. ° °Meriah Shands, MD  °5:47 PM ° ° °

## 2018-12-25 NOTE — H&P (View-Only) (Signed)
Advanced Heart Failure Team Consult Note   Primary Physician: Shirley, [name], DO PCP-Cardiologist:  Seen remotely by Dr. Debara Pickett in 2017 (no clinic f/u) AHFC: New (Dr. Haroldine Laws)  Reason for Consultation: HFpEF / Acute on Chronic diastolic HF and pulmonary HTN  HPI:    Joy [name] is being seen today for evaluation of HFpEF and pulmonary HTN at the request of Dr. Nori Riis, [facility].   She is a 58 y/o AA female smoker w/ COPD, pulmonary nodule (4 mm LLL by CT in 2015 and 2017), chronic diastolic HF, RV failure, pulmonary HTN and essential HTN. Does not get regular preventative care due to lack of insurance.    She was first admitted to [facility] in 07/2015 w/ LEE, abdominal bloating and DOE and diagnosed w/ acute CHF. BNP that admit was 548. 2D echo showed normal EF at 60-65% w/ G1DD, mildly reduced RV systolic function, moderately to severely dilated RA and severely increased PA systolic pressure at 72 mm Hg. Chest CT was negative for PE but did show stable LLL pulmonary nodule measuring at 4 mm and unchanged from previous scan in 2015. She was seen by general cardiology. She was diuresed w/ IV Lasix w/ symptomatic improvement and improvement in volume. It was recommended that she be considered for outpatient RHC given pulmonary HTN and also advised that she be considered for outpatient sleep study evaluation given h/o snoring to r/o OSA, but pt was lost to f/u. It appears she did have a repeat echo in 02/2018 that showed EF at 60-65%, G2DD. RV moderately reduced and PA systolic pressure was 61 mmHg.   She presented to the [facility] ED on 10/7 w/ complaints of SOB and productive cough x 2 weeks (white sputum). Also had an episode of SSCP last week, occurring at rest and associated w/ wheezing. In the ED, she was hypoxic w/ O2 sats in the 80 and was placed on [place] 4L/min w/ improvement in saturations. COVID negative. CXR showed cardiomegaly w/o acute abnormality. BNP 909 (higher that prior baseline). Hs  troponin 37>>29. SCr 1.00. K 3.6 on admit. WBC 5.9. Hgb 18.4.      She was admitted by family medicine for acute hypoxic respiratory failure, felt to be multifactorial, 2/2 acute COPD exacerbation and acute on chronic diastolic HF. She was started on azithromycin, prednisone and inhalers. Got 1 x dose of IV lasix, 40 mg in the ED and placed back on PO torsemide today. Repeat 2D echo ordered and pending. AHF consulted for further assistance w/ diastolic HF and pulmonary HTN.  She is currently comfortable at rest. Still w/ productive cough. No current wheezing. No current CP. Hasn't ambulated much since admission. Still smokes ~1/2 ppd. Works at [employer] in [employer]. Up until 2 weeks ago, was able to do ADLs and basic job duties w/o significant exertional dyspnea. She report being compliant w/ her "water pill" but ran out of her blood pressure pills last month. BP while in the ED was 121/79.     Echo 02/2018 Study Conclusions  - Left ventricle: The cavity size was normal. There was moderate   concentric hypertrophy. Systolic function was normal. The   estimated ejection fraction was in the range of 60% to 65%. Wall   motion was normal; there were no regional wall motion   abnormalities. Features are consistent with a pseudonormal left   ventricular filling pattern, with concomitant abnormal relaxation   and increased filling pressure (grade 2 diastolic dysfunction).  Doppler parameters are consistent with elevated ventricular   end-diastolic filling pressure. - Mitral valve: There was mild regurgitation. - Right ventricle: The cavity size was moderately dilated. Wall   thickness was normal. Systolic function was moderately reduced. - Right atrium: The atrium was moderately dilated. - Tricuspid valve: There was mild regurgitation. - Pulmonary arteries: Systolic pressure was moderately increased.   PA peak pressure: 61 mm Hg (S). - Inferior vena cava: The vessel was dilated.  The respirophasic   diameter changes were blunted (< 50%), consistent with elevated   central venous pressure. - Pericardium, extracardiac: A trivial pericardial effusion was   identified posterior to the heart.  Impressions:  - There has been no significant change since the prior study on   08/09/2015.   Review of Systems: [y] = yes, _0  = no    General: Weight gain _1 ; Weight loss _2 ; Anorexia _3 ; Fatigue _4 ; Fever _5 ; Chills _6 ; Weakness _7    Cardiac: Chest pain/pressure [Y ]; Resting SOB _8 ; Exertional SOB [ Y]; Orthopnea _9 ; Pedal Edema _10 ; Palpitations _11 ; Syncope _12 ; Presyncope _13 ; Paroxysmal nocturnal dyspnea_14    Pulmonary: Cough [ Y]; Wheezing[Y ]; Hemoptysis_15 ; Sputum [Y ]; Snoring [Y ]   GI: Vomiting_16 ; Dysphagia_17 ; Melena_18 ; Hematochezia _19 ; Heartburn_20 ; Abdominal pain _21 ; Constipation _22 ; Diarrhea _23 ; BRBPR _24    GU: Hematuria_25 ; Dysuria _26 ; Nocturia_27    Vascular: Pain in legs with walking _28 ; Pain in feet with lying flat _29 ; Non-healing sores _30 ; Stroke _31 ; TIA _32 ; Slurred speech _33 ;   Neuro: Headaches_34 ; Vertigo_35 ; Seizures_36 ; Paresthesias_37 ;Blurred vision _38 ; Diplopia _39 ; Vision changes _40    Ortho/Skin: Arthritis _41 ; Joint pain _42 ; Muscle pain _43 ; Joint swelling _44 ; Back Pain _45 ; Rash _46    Psych: Depression_47 ; Anxiety_48    Heme: Bleeding problems _49 ; Clotting disorders _50 ; Anemia _51    Endocrine: Diabetes _52 ; Thyroid dysfunction_53   Home Medications Prior to Admission medications   Medication Sig Start Date End Date Taking? Authorizing Provider  albuterol (PROVENTIL HFA;VENTOLIN HFA) 108 (90 Base) MCG/ACT inhaler Inhale 2 puffs into the lungs every 6 (six) hours as needed for wheezing or shortness of breath. 07/04/16  Yes Vivi Barrack, MD  lisinopril (ZESTRIL) 5 MG tablet TAKE 1 TABLET BY MOUTH EVERY DAY Patient taking differently: Take 5 mg by mouth daily.  12/23/18  Yes Enid Derry, [name], DO  Multiple  Vitamin (MULTIVITAMIN WITH MINERALS) TABS tablet Take 1 tablet by mouth daily.   Yes [provider]  torsemide (DEMADEX) 20 MG tablet TAKE 1 TABLET BY MOUTH EVERY DAY Patient taking differently: Take 20 mg by mouth daily.  12/01/18  Yes Enid Derry, [name], DO  acetaZOLAMIDE (DIAMOX) 250 MG tablet Take 1 tablet (250 mg total) by mouth daily. Patient not taking: Reported on 12/24/2018 11/05/18   Shirley, [name], DO    Past Medical History: Past Medical History:  Diagnosis Date   Acute congestive heart failure ([place])    Acute dyspnea    Acute on chronic diastolic CHF (congestive heart failure) ([place]) 08/11/2015   Acute right heart failure ([place]) 08/11/2015   Bilateral lower extremity edema    CHF (congestive heart failure) ([facility])    COPD (chronic obstructive pulmonary disease) ([place]) 08/10/2015   Dx on XRAY 08/08/15.  Needs PFTs    GASTROESOPHAGEAL REFLUX, NO ESOPHAGITIS 05/16/2006   Qualifier: Diagnosis of  By: Drucie Ip     GERD (gastroesophageal reflux disease)    Hypertension    Hypervolemia    Leg swelling hospitalized 08/08/2015   BLE   PELVIC MASS 07/05/2008   Qualifier: Diagnosis of  By: McDiarmid MD, Todd     Pulmonary hypertension ([place])    Right heart failure ([place]) 08/10/2015   Tobacco abuse 05/16/2006   Qualifier: Diagnosis of  By: Drucie Ip      Past Surgical History: Past Surgical History:  Procedure Laterality Date   TUBAL LIGATION      Family History: No family history on file.  Social History: Social History   Socioeconomic History   Marital status: Married    Spouse name: Not on file   Number of children: Not on file   Years of education: Not on file   Highest education level: Not on file  Occupational History   Not on file  Social Needs   Financial resource strain: Not on file   Food insecurity    Worry: Not on file    Inability: Not on file   Transportation needs    Medical: Not on file    Non-medical: Not on file    Tobacco Use   Smoking status: Current Every Day Smoker    Packs/day: 0.15    Years: 36.00    Pack years: 5.40    Types: Cigarettes   Smokeless tobacco: Former Systems developer    Types: Chew   Tobacco comment: "quit chewing in my early '20s"  Substance and Sexual Activity   Alcohol use: Yes    Alcohol/week: 23.0 standard drinks    Types: 12 Cans of beer, 11 Shots of liquor per week   Drug use: Yes    Types: Marijuana    Comment: 08/08/2015 "I smoke marijuana when I have it; probably a couple times/week"   Sexual activity: Yes  Lifestyle   Physical activity    Days per week: Not on file    Minutes per session: Not on file   Stress: Not on file  Relationships   Social connections    Talks on phone: Not on file    Gets together: Not on file    Attends religious service: Not on file    Active member of club or organization: Not on file    Attends meetings of clubs or organizations: Not on file    Relationship status: Not on file  Other Topics Concern   Not on file  Social History Narrative   Not on file    Allergies:  No Known Allergies  Objective:    Vital Signs:   Temp:  [97.8 F (36.6 C)-98.4 F (36.9 C)] 98 F (36.7 C) (10/08 1347) Pulse Rate:  [57-82] 72 (10/08 1347) Resp:  [14-18] 18 (10/08 1347) BP: (102-125)/(65-82) 102/65 (10/08 1347) SpO2:  [95 %-100 %] 100 % (10/08 1347) Last BM Date: 12/24/18  Weight change: Filed Weights   12/24/18 0851  Weight: 71 kg    Intake/Output:   Intake/Output Summary (Last 24 hours) at 12/25/2018 1604 Last data filed at 12/25/2018 0900 Gross per 24 hour  Intake 670 ml  Output --  Net 670 ml      Physical Exam    General:  Well appearing. No resp difficulty HEENT: normal Neck: elevated JVD . Carotids 2+ bilat; no bruits. No lymphadenopathy or thyromegaly appreciated. Cor: PMI nondisplaced. Regular rate &  rhythm. S4 gallop  Lungs: LLL crackles, otherwise CTA Abdomen: abdominal edema, soft, nontender,  nondistended. No hepatosplenomegaly. No bruits or masses. Good bowel sounds. Extremities: no cyanosis, clubbing, rash, edema Neuro: alert & orientedx3, cranial nerves grossly intact. moves all 4 extremities w/o difficulty. Affect pleasant   Telemetry   NSR 70s   EKG    NSR, 74 bpm  normal axis. LAE, RAE, ? RHV, inferior and lateral TWIs, no significant change from previous  Labs   Basic Metabolic Panel: Recent Labs  Lab 12/24/18 0843 12/24/18 1113 12/24/18 1304 12/25/18 0252  NA 136 132* 136 139  K 3.3* 6.6* 3.6 3.1*  CL 86*  --  87* 86*  CO2 36*  --   --  39*  GLUCOSE 134*  --  105* 112*  BUN 20  --  27* 18  CREATININE 1.25*  --  1.00 0.87  CALCIUM 9.0  --   --  9.0    Liver Function Tests: Recent Labs  Lab 12/24/18 1106  AST 29  ALT 23  ALKPHOS 67  BILITOT 0.9  PROT 6.3*  ALBUMIN 3.4*   No results for input(s): LIPASE, AMYLASE in the last 168 hours. No results for input(s): AMMONIA in the last 168 hours.  CBC: Recent Labs  Lab 12/24/18 0843 12/24/18 1113 12/24/18 1304 12/25/18 0252  WBC 4.9  --   --  5.9  NEUTROABS 2.7  --   --   --   HGB 16.1* 18.4* 18.7* 16.5*  HCT 53.4* 54.0* 55.0* 53.8*  MCV 96.6  --   --  95.2  PLT 281  --   --  309    Cardiac Enzymes: No results for input(s): CKTOTAL, CKMB, CKMBINDEX, TROPONINI in the last 168 hours.  BNP: BNP (last 3 results) Recent Labs    03/03/18 1800 12/24/18 0843  BNP 450.1* 909.6*    ProBNP (last 3 results) No results for input(s): PROBNP in the last 8760 hours.   CBG: No results for input(s): GLUCAP in the last 168 hours.  Coagulation Studies: No results for input(s): LABPROT, INR in the last 72 hours.   Imaging    No results found.   Medications:     Current Medications:  azithromycin  500 mg Oral Daily   enoxaparin (LOVENOX) injection  40 mg Subcutaneous Q24H   nicotine  7 mg Transdermal Daily   predniSONE  40 mg Oral Q breakfast   torsemide  20 mg Oral Daily    umeclidinium bromide  1 puff Inhalation Daily     Infusions:   Patient Profile   58 y/o AA female smoker w/ COPD, pulmonary nodule (4 mm LLL by CT in 2015 and 2017), chronic diastolic HF, RV failure, pulmonary HTN and essential HTN, admitted for acute hypoxic respiratory failure, 2/2 acute COPDE, a/c diastolic HF w/ RV failure and pulmonary HTN.   Assessment/Plan   1. Acute Hypoxic Respiratory Failure: presented w/ 2 week history of exertional dyspnea and productive cough. Hypoxic on arrival w/ O2 sats in the 80s. Improved w/ supplemental O2.  COVID negative. CXR w/ cardiomegaly but no infiltrate. Mild edema. BNP elevated at 909. Hs troponin minimally elevated w/ flat trend not c/w ACS.  -Suspect multifactorial 2/2 acute COPDE, a/c dCHF and pulmonary HTN  2. Acute on Chronic Diastolic HF: BNP 782 on admit. CXR w/ mild edema. Prior echo in 02/2018 showed G2DD and normal LVEF, 60-65%. Exam + for elevated JVD, abdominal edema and LLL crackles. SCr ok -needs additional  diuresis w/ IV diuretics - stop PO torsemide -add IV Lasix 80 mg bid -Add spironolactone 25 mg daily -Supplement K - strict I/Os + daily weights  - f/u BMP in the AM -R/LHC in the AM   3. 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 Perhaps combination of WHO Group 2 w/ chronic diastolic HF. Prior echocardiograms showed severely elevated PA pressures in the 60s-70s mmHg. Repeat echo pending.  -Continue w/ supplemental O2 -Continue w/ Diuretics -Check PFTs -Plan RHC tomorrow -Would benefit from outpatient sleep study   4. Chest Pain: pt w/ recent SSCP described as tightness. Has CRFs for CAD. Hs troponin 37>>29, not c/w ACS, likely demand ischemia from hypoxia and a/c dCHF.  -Given symptoms and CRFs, will plan LHC in the AM NPO at midnight   5. Tobacco Abuse: smoking cessation advised  6. Hypokalemia: K 3.1 -  Will give 40 mEq of Kdur now  and an additional dose of 40 mEq tonight w/ dose of IV Lasix.  -Add spiro 25 mg daily -F/u BMP in the AM.   7. Pulmonary Nodule: - 4 mm LLL nodule on chest CT in 2017 -active smoker -defer further w/u to primary team    Length of Stay: [place], PA-C  12/25/2018, 4:04 PM  Advanced Heart Failure Team Pager [phone] (M-F; 7a - 4p)  Please contact [facility] for night-coverage after hours (4p -7a ) and weekends on [email or web address]  Patient seen and examined with the above-signed Advanced Practice Provider and/or Housestaff. I personally reviewed laboratory data, imaging studies and relevant notes. I independently examined the patient and formulated the important aspects of the plan. I have edited the note to reflect any of my changes or salient points. I have personally discussed the plan with the patient and/or family.  58 y/o woman with COPD and ongoing tobacco use, diastolic HF admitted with recurrent hypoxemic respiratory failure. BNP elevated. ABG with evidence of chronic CO2 retention. + intermittent CP.   Echo reviewed personally LVEF 60-65% +grade 2 DD. RV moderately dialted with moderately decrease RV function. Septal flattening c/w RV pressure volume overload.   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 Will continue diuresis overnight. Plan R/L cath in am to further evaluate PH and CP. Will need PFTs with DLCO. Counseled on need for smoking cessation.  Glori Bickers, MD  5:47 PM

## 2018-12-25 NOTE — Progress Notes (Signed)
SATURATION QUALIFICATIONS: (This note is used to comply with regulatory documentation for home oxygen)  Patient Saturations on Room Air at Rest = 92%  Patient Saturations on Room Air while Ambulating = 83%  Patient Saturations on 2 Liters of oxygen while Ambulating = 98%

## 2018-12-25 NOTE — Progress Notes (Signed)
  Echocardiogram 2D Echocardiogram has been performed.  Joy [name] 12/25/2018, 5:02 PM

## 2018-12-25 NOTE — Progress Notes (Signed)
Family Medicine Teaching Service Daily Progress Note Intern Pager: [phone]  Patient name: Joy [name] record number: [id number] Date of birth: 1960/09/12 Age: 58 y.o. Gender: female  Primary Care Provider: Shirley, [name], DO Consultants: None Code Status: DNR  Pt Overview and Major Events to Date:  10/7 - admitted  Assessment and Plan: ITZIA CUNLIFFE is a 58 y.o. female presenting with shortness of breath and cough x2 weeks. PMH is significant for COPD, CHF, tobacco abuse, GERD, hypertension.  Hypoxic respiratory failure likely secondary to COPD exacerbation primarily with a contribution from HFpEF Patient satting 96% on 2 L nasal cannula, patient maintained this O2 sat when reduced to room air.  Patient did however desat when ambulating with pulse ox on room air later in the day. -Monitor respiratory status - 5 days azithro - Duonebs - prednisone 5 day burst -CBC/BMP for AM -Wean oxygen as able as treatment progresses -Cardiac monitoring, continuous pulse ox  Heart failure with preserved ejection fraction Last ECHO 02/2018 EF 60-65%.  Home medications include lisinopril, torsemide.  Patient chest x-ray without obvious signs of fluid overload on the lungs.  Patient with only trace edema in lower extremities. - Hold lisinopril due to patient AKI -Continue to monitor fluid status -Daily torsemide 31m  Hypokalemia: Potassium 3.1 on 10/8.  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 Patient was noted to have an elevated hemoglobin of 18.4 and 18.7 on admission.  On chart review she appears to have a hemoglobin in the normal range up until approximately May 2017, when her hemoglobin was noted to be 15.5.  Since then it seems to consistently be in the 16-18 range.  Patient does state that she snores when sleeping and does feel fatigued throughout the day. -Repeat CBC AM -Consider  sleep study as outpatient to look for OSA  GERD Patient not currently on medication for this, chart review reveals she used to take omeprazole 20 for GERD in the past. -Continue to monitor, can consider adding PPI if patient experiences GERD symptoms  Pulmonary hypertension Noted on echo in 2017 with pulmonary artery pressure of 72 mmHg, this had decreased to 61 mmHg on repeat echo in 2019, reference range  <30. -Continue to monitor -Repeat echo ending  Tobacco abuse States she has cut down on her smoking and now is currently smokes 0.5-1 pack/day. -Nicotine patch provided -Recommend tobacco cessation  Hypertension Most recent BP 107/82. BP range over last 24 hrs of 107-144/78-95.  Home medications include lisinopril. -Hold home lisinopril due to AKI -Continue to monitor  FEN/GI: Heart Healthy   PPx: Lovenox  Disposition: Pending clinical improvment  Subjective:  Weaned patient to room air, patient was able to maintain O2 sat of 96%.  Patient enjoying breakfast without complaints today and states that she feels her breathing has somewhat improved.  She denied of the pains and issues.    Additional: Patient did express concern that she did not have transportation for her cardiology appointments and this has caused her to miss these in the past.  Have consulted social work.  Objective: Temp:  [97.8 F (36.6 C)-98.4 F (36.9 C)] 98 F (36.7 C) (10/08 1347) Pulse Rate:  [57-82] 72 (10/08 1347) Resp:  [14-20] 18 (10/08 1347) BP: (102-144)/(65-93) 102/65 (10/08 1347) SpO2:  [90 %-100 %] 100 % (10/08 1347)   Physical Exam: General: Alert and oriented in no apparent distress Heart: Regular rate and rhythm  with no murmurs appreciated Lungs: Faint rhonchi noted on the right lower lobe, fine crackles fairly noted in lower lung fields bilaterally Abdomen: Bowel sounds present, no abdominal pain Skin: Warm and dry Extremities: Trace lower limb edema, similar to  yesterday.   Laboratory: Recent Labs  Lab 12/24/18 0843 12/24/18 1113 12/24/18 1304 12/25/18 0252  WBC 4.9  --   --  5.9  HGB 16.1* 18.4* 18.7* 16.5*  HCT 53.4* 54.0* 55.0* 53.8*  PLT 281  --   --  309   Recent Labs  Lab 12/24/18 0843 12/24/18 1106 12/24/18 1113 12/24/18 1304 12/25/18 0252  NA 136  --  132* 136 139  K 3.3*  --  6.6* 3.6 3.1*  CL 86*  --   --  87* 86*  CO2 36*  --   --   --  39*  BUN 20  --   --  27* 18  CREATININE 1.25*  --   --  1.00 0.87  CALCIUM 9.0  --   --   --  9.0  PROT  --  6.3*  --   --   --   BILITOT  --  0.9  --   --   --   ALKPHOS  --  67  --   --   --   ALT  --  23  --   --   --   AST  --  29  --   --   --   GLUCOSE 134*  --   --  105* 112*    Imaging/Diagnostic Tests:   Lurline Del, DO 12/25/2018, 1:56 PM PGY-1, [place] Intern pager: [phone], text pages welcome

## 2018-12-25 NOTE — Discharge Summary (Signed)
[facility] Discharge Summary  Patient name: Joy [name] record number: [id number] Date of birth: 17-Jul-1960 Age: 58 y.o. Gender: female Date of Admission: 12/24/2018  Date of Discharge: 12/27/2018 Admitting Physician: Dickie La, MD  Primary Care Provider: Shirley, [name], DO Consultants: Cardiology  Indication for Hospitalization: Shortness of breath and weakness  Discharge Diagnoses/Problem List:  COPD Tobacco abuse CHF Pulmonary hypertension Essential hypertension  Disposition: Home  Discharge Condition: Stable  Discharge Exam:   General: Alert and oriented in no apparent distress Heart: Regular rate and rhythm with no murmurs appreciated Lungs: CTA bilaterally Abdomen: Bowel sounds present, no abdominal pain  Brief Hospital Course:  Patient was admitted after approximately 2 weeks of increased sputum production and shortness of breath as well as fatigue.  She was noted to have desats on room air in the emergency department.  It was determined that her hypoxic respiratory failure was likely secondary to a combination of her COPD and her HFpEF.  On the second day of her hospitalization she was able to wean to room air and satting well with this.  She was ambulated with pulse ox however her O2 sat dropped during this time..  Due to her history of pulmonary hypertension an echo was performed during her hospitalization which showed right ventricle moderately dilated with moderately decreased function.  Patient had a right and left cath which suggested COPD was more the cause of her symptoms.  Patient was discharged on azithromycin, duo nebs, prednisone burst, as well as new medications, Incruse Ellipta for her COPD.  Issues for Follow Up:  1. PCP follow-up regarding patient's COPD and new medications initiated 2. Recommend pulmonology follow-up outpatient for PFTs 3. PCP-consider ordering sleep study for patient as she endorses snoring and  general fatigue during the day.    Significant Procedures: Echo  Significant Labs and Imaging:  Recent Labs  Lab 12/24/18 0843  12/25/18 0252  12/26/18 1046 12/26/18 1051 12/26/18 1438  WBC 4.9  --  5.9  --   --   --  7.3  HGB 16.1*   < > 16.5*   < > 19.4* 18.0* 17.7*  HCT 53.4*   < > 53.8*   < > 57.0* 53.0* 55.9*  PLT 281  --  309  --   --   --  364   < > = values in this interval not displayed.   Recent Labs  Lab 12/24/18 0843 12/24/18 1106  12/24/18 1304 12/25/18 0252 12/26/18 0229  12/26/18 1037 12/26/18 1038 12/26/18 1046 12/26/18 1051 12/26/18 1438 12/27/18 0224  NA 136  --    < > 136 139 138   < > 141 137 138 140  --  136  K 3.3*  --    < > 3.6 3.1* 4.2   < > 3.1* 2.8* 3.4* 2.8*  --  4.8  CL 86*  --   --  87* 86* 87*  --   --   --   --   --   --  89*  CO2 36*  --   --   --  39* 39*  --   --   --   --   --   --  31  GLUCOSE 134*  --   --  105* 112* 110*  --   --   --   --   --   --  89  BUN 20  --   --  27* 18 24*  --   --   --   --   --   --  17  CREATININE 1.25*  --   --  1.00 0.87 0.87  --   --   --   --   --  0.71 0.69  CALCIUM 9.0  --   --   --  9.0 9.1  --   --   --   --   --   --  9.3  ALKPHOS  --  67  --   --   --   --   --   --   --   --   --   --   --   AST  --  29  --   --   --   --   --   --   --   --   --   --   --   ALT  --  23  --   --   --   --   --   --   --   --   --   --   --   ALBUMIN  --  3.4*  --   --   --   --   --   --   --   --   --   --   --    < > = values in this interval not displayed.      Results/Tests Pending at Time of Discharge: None  Discharge Medications:  Allergies as of 12/27/2018   No Known Allergies     Medication List    STOP taking these medications   acetaZOLAMIDE 250 MG tablet Commonly known as: DIAMOX   lisinopril 5 MG tablet Commonly known as: ZESTRIL   torsemide 20 MG tablet Commonly known as: DEMADEX     TAKE these medications   albuterol 108 (90 Base) MCG/ACT inhaler Commonly known as:  VENTOLIN HFA Inhale 2 puffs into the lungs every 6 (six) hours as needed for wheezing or shortness of breath.   multivitamin with minerals Tabs tablet Take 1 tablet by mouth daily.   predniSONE 20 MG tablet Commonly known as: DELTASONE Take 2 tablets (40 mg total) by mouth daily with breakfast for 2 days. Start taking on: December 28, 2018   spironolactone 25 MG tablet Commonly known as: ALDACTONE Take 1 tablet (25 mg total) by mouth daily. Start taking on: December 28, 2018   umeclidinium bromide 62.5 MCG/INH Aepb Commonly known as: INCRUSE ELLIPTA Inhale 1 puff into the lungs daily. Start taking on: December 28, 2018       Discharge Instructions: Please refer to Patient Instructions section of EMR for full details.  Patient was counseled important signs and symptoms that should prompt return to medical care, changes in medications, dietary instructions, activity restrictions, and follow up appointments.   Follow-Up Appointments: Follow-up Information    Shirley, [name], DO Follow up on 12/30/2018.   Specialty: Family Medicine Why: @ 2:50pm Contact information: [address] [place] [address] [place], [place], DO 12/27/2018, 6:47 PM PGY-1, [place]

## 2018-12-26 ENCOUNTER — Other Ambulatory Visit: Payer: Self-pay

## 2018-12-26 ENCOUNTER — Encounter (HOSPITAL_COMMUNITY)
Admission: EM | Disposition: A | Discharge status: Home / Self Care | Payer: Self-pay | Source: Home / Self Care | Attending: Family Medicine

## 2018-12-26 DIAGNOSIS — I2721 Secondary pulmonary arterial hypertension: Secondary | ICD-10-CM

## 2018-12-26 HISTORY — PX: RIGHT/LEFT HEART CATH AND CORONARY ANGIOGRAPHY: CATH118266

## 2018-12-26 LAB — POCT I-STAT EG7
Acid-Base Excess: 14 mmol/L — ABNORMAL HIGH (ref 0.0–2.0)
Acid-Base Excess: 15 mmol/L — ABNORMAL HIGH (ref 0.0–2.0)
Bicarbonate: 43.6 mmol/L — ABNORMAL HIGH (ref 20.0–28.0)
Bicarbonate: 45.1 mmol/L — ABNORMAL HIGH (ref 20.0–28.0)
Calcium, Ion: 0.98 mmol/L — ABNORMAL LOW (ref 1.15–1.40)
Calcium, Ion: 1 mmol/L — ABNORMAL LOW (ref 1.15–1.40)
HCT: 53 % — ABNORMAL HIGH (ref 36.0–46.0)
HCT: 54 % — ABNORMAL HIGH (ref 36.0–46.0)
Hemoglobin: 18 g/dL — ABNORMAL HIGH (ref 12.0–15.0)
Hemoglobin: 18.4 g/dL — ABNORMAL HIGH (ref 12.0–15.0)
O2 Saturation: 54 %
O2 Saturation: 56 %
Potassium: 2.8 mmol/L — ABNORMAL LOW (ref 3.5–5.1)
Potassium: 2.8 mmol/L — ABNORMAL LOW (ref 3.5–5.1)
Sodium: 137 mmol/L (ref 135–145)
Sodium: 140 mmol/L (ref 135–145)
TCO2: 46 mmol/L — ABNORMAL HIGH (ref 22–32)
TCO2: 47 mmol/L — ABNORMAL HIGH (ref 22–32)
pCO2, Ven: 72.6 mmHg (ref 44.0–60.0)
pCO2, Ven: 75.9 mmHg (ref 44.0–60.0)
pH, Ven: 7.382 (ref 7.250–7.430)
pH, Ven: 7.386 (ref 7.250–7.430)
pO2, Ven: 31 mmHg — CL (ref 32.0–45.0)
pO2, Ven: 32 mmHg (ref 32.0–45.0)

## 2018-12-26 LAB — POCT I-STAT 7, (LYTES, BLD GAS, ICA,H+H)
Acid-Base Excess: 16 mmol/L — ABNORMAL HIGH (ref 0.0–2.0)
Acid-Base Excess: 17 mmol/L — ABNORMAL HIGH (ref 0.0–2.0)
Acid-Base Excess: 19 mmol/L — ABNORMAL HIGH (ref 0.0–2.0)
Bicarbonate: 46 mmol/L — ABNORMAL HIGH (ref 20.0–28.0)
Bicarbonate: 47.2 mmol/L — ABNORMAL HIGH (ref 20.0–28.0)
Bicarbonate: 49.3 mmol/L — ABNORMAL HIGH (ref 20.0–28.0)
Calcium, Ion: 1.05 mmol/L — ABNORMAL LOW (ref 1.15–1.40)
Calcium, Ion: 1.1 mmol/L — ABNORMAL LOW (ref 1.15–1.40)
Calcium, Ion: 1.15 mmol/L (ref 1.15–1.40)
HCT: 55 % — ABNORMAL HIGH (ref 36.0–46.0)
HCT: 56 % — ABNORMAL HIGH (ref 36.0–46.0)
HCT: 57 % — ABNORMAL HIGH (ref 36.0–46.0)
Hemoglobin: 18.7 g/dL — ABNORMAL HIGH (ref 12.0–15.0)
Hemoglobin: 19 g/dL — ABNORMAL HIGH (ref 12.0–15.0)
Hemoglobin: 19.4 g/dL — ABNORMAL HIGH (ref 12.0–15.0)
O2 Saturation: 72 %
O2 Saturation: 75 %
O2 Saturation: 83 %
Potassium: 3.1 mmol/L — ABNORMAL LOW (ref 3.5–5.1)
Potassium: 3.2 mmol/L — ABNORMAL LOW (ref 3.5–5.1)
Potassium: 3.4 mmol/L — ABNORMAL LOW (ref 3.5–5.1)
Sodium: 138 mmol/L (ref 135–145)
Sodium: 139 mmol/L (ref 135–145)
Sodium: 141 mmol/L (ref 135–145)
TCO2: 48 mmol/L — ABNORMAL HIGH (ref 22–32)
TCO2: 49 mmol/L — ABNORMAL HIGH (ref 22–32)
TCO2: >50 mmol/L — ABNORMAL HIGH (ref 22–32)
pCO2 arterial: 71.4 mmHg (ref 32.0–48.0)
pCO2 arterial: 72.1 mmHg (ref 32.0–48.0)
pCO2 arterial: 72.6 mmHg (ref 32.0–48.0)
pH, Arterial: 7.413 (ref 7.350–7.450)
pH, Arterial: 7.421 (ref 7.350–7.450)
pH, Arterial: 7.448 (ref 7.350–7.450)
pO2, Arterial: 40 mmHg — CL (ref 83.0–108.0)
pO2, Arterial: 42 mmHg — ABNORMAL LOW (ref 83.0–108.0)
pO2, Arterial: 49 mmHg — ABNORMAL LOW (ref 83.0–108.0)

## 2018-12-26 LAB — CBC
HCT: 55.9 % — ABNORMAL HIGH (ref 36.0–46.0)
Hemoglobin: 17.7 g/dL — ABNORMAL HIGH (ref 12.0–15.0)
MCH: 30.1 pg (ref 26.0–34.0)
MCHC: 31.7 g/dL (ref 30.0–36.0)
MCV: 95.1 fL (ref 80.0–100.0)
Platelets: 364 10*3/uL (ref 150–400)
RBC: 5.88 MIL/uL — ABNORMAL HIGH (ref 3.87–5.11)
RDW: 14.6 % (ref 11.5–15.5)
WBC: 7.3 10*3/uL (ref 4.0–10.5)
nRBC: 0.3 % — ABNORMAL HIGH (ref 0.0–0.2)

## 2018-12-26 LAB — CREATININE, SERUM
Creatinine, Ser: 0.71 mg/dL (ref 0.44–1.00)
GFR calc Af Amer: >60 mL/min (ref >60)
GFR calc non Af Amer: >60 mL/min (ref >60)

## 2018-12-26 LAB — BASIC METABOLIC PANEL
Anion gap: 12 (ref 5–15)
BUN: 24 mg/dL — ABNORMAL HIGH (ref 6–20)
CO2: 39 mmol/L — ABNORMAL HIGH (ref 22–32)
Calcium: 9.1 mg/dL (ref 8.9–10.3)
Chloride: 87 mmol/L — ABNORMAL LOW (ref 98–111)
Creatinine, Ser: 0.87 mg/dL (ref 0.44–1.00)
GFR calc Af Amer: >60 mL/min (ref >60)
GFR calc non Af Amer: >60 mL/min (ref >60)
Glucose, Bld: 110 mg/dL — ABNORMAL HIGH (ref 70–99)
Potassium: 4.2 mmol/L (ref 3.5–5.1)
Sodium: 138 mmol/L (ref 135–145)

## 2018-12-26 SURGERY — RIGHT/LEFT HEART CATH AND CORONARY ANGIOGRAPHY
Anesthesia: LOCAL

## 2018-12-26 MED ORDER — ASPIRIN 81 MG PO CHEW
81.0000 mg | CHEWABLE_TABLET | ORAL | Status: AC
  Administered 2018-12-26: 81 mg via ORAL
  Filled 2018-12-26: qty 1

## 2018-12-26 MED ORDER — SODIUM CHLORIDE 0.9 % IV SOLN
INTRAVENOUS | Status: DC
  Administered 2018-12-26: 07:00:00 via INTRAVENOUS

## 2018-12-26 MED ORDER — HEPARIN (PORCINE) IN NACL 1000-0.9 UT/500ML-% IV SOLN
INTRAVENOUS | Status: DC | PRN
  Administered 2018-12-26 (×2): 500 mL

## 2018-12-26 MED ORDER — IOHEXOL 350 MG/ML SOLN
INTRAVENOUS | Status: DC | PRN
  Administered 2018-12-26: 11:00:00 40 mL

## 2018-12-26 MED ORDER — SODIUM CHLORIDE 0.9% FLUSH: 3.0000 mL | INTRAVENOUS | Status: DC | PRN

## 2018-12-26 MED ORDER — SODIUM CHLORIDE 0.9 % IV SOLN: INTRAVENOUS | Status: AC

## 2018-12-26 MED ORDER — ACETAMINOPHEN 325 MG PO TABS: 650.0000 mg | ORAL_TABLET | ORAL | Status: DC | PRN

## 2018-12-26 MED ORDER — HEPARIN SODIUM (PORCINE) 1000 UNIT/ML IJ SOLN
INTRAMUSCULAR | Status: AC
  Filled 2018-12-26: qty 1

## 2018-12-26 MED ORDER — HYDRALAZINE HCL 20 MG/ML IJ SOLN: 10.0000 mg | INTRAMUSCULAR | Status: AC | PRN

## 2018-12-26 MED ORDER — SODIUM CHLORIDE 0.9% FLUSH
3.0000 mL | Freq: Two times a day (BID) | INTRAVENOUS | Status: DC
  Administered 2018-12-26 – 2018-12-27 (×2): 3 mL via INTRAVENOUS

## 2018-12-26 MED ORDER — INFLUENZA VAC SPLIT QUAD 0.5 ML IM SUSY
0.5000 mL | PREFILLED_SYRINGE | INTRAMUSCULAR | Status: AC
  Administered 2018-12-27: 0.5 mL via INTRAMUSCULAR
  Filled 2018-12-26: qty 0.5

## 2018-12-26 MED ORDER — VERAPAMIL HCL 2.5 MG/ML IV SOLN
INTRAVENOUS | Status: DC | PRN
  Administered 2018-12-26: 11:00:00 10 mL via INTRA_ARTERIAL

## 2018-12-26 MED ORDER — SODIUM CHLORIDE 0.9% FLUSH: 3.0000 mL | Freq: Two times a day (BID) | INTRAVENOUS | Status: DC

## 2018-12-26 MED ORDER — HEPARIN (PORCINE) IN NACL 1000-0.9 UT/500ML-% IV SOLN
INTRAVENOUS | Status: AC
  Filled 2018-12-26: qty 1000

## 2018-12-26 MED ORDER — SODIUM CHLORIDE 0.9 % IV SOLN: 250.0000 mL | INTRAVENOUS | Status: DC | PRN

## 2018-12-26 MED ORDER — VERAPAMIL HCL 2.5 MG/ML IV SOLN
INTRAVENOUS | Status: AC
  Filled 2018-12-26: qty 2

## 2018-12-26 MED ORDER — ENOXAPARIN SODIUM 40 MG/0.4ML ~~LOC~~ SOLN
40.0000 mg | SUBCUTANEOUS | Status: DC
  Administered 2018-12-27: 40 mg via SUBCUTANEOUS
  Filled 2018-12-26: qty 0.4

## 2018-12-26 MED ORDER — HEPARIN SODIUM (PORCINE) 1000 UNIT/ML IJ SOLN
INTRAMUSCULAR | Status: DC | PRN
  Administered 2018-12-26: 3500 [IU] via INTRAVENOUS

## 2018-12-26 MED ORDER — LIDOCAINE HCL (PF) 1 % IJ SOLN
INTRAMUSCULAR | Status: AC
  Filled 2018-12-26: qty 30

## 2018-12-26 MED ORDER — LIDOCAINE HCL (PF) 1 % IJ SOLN
INTRAMUSCULAR | Status: DC | PRN
  Administered 2018-12-26 (×2): 2 mL

## 2018-12-26 MED ORDER — LABETALOL HCL 5 MG/ML IV SOLN: 10.0000 mg | INTRAVENOUS | Status: AC | PRN

## 2018-12-26 MED ORDER — ONDANSETRON HCL 4 MG/2ML IJ SOLN: 4.0000 mg | Freq: Four times a day (QID) | INTRAMUSCULAR | Status: DC | PRN

## 2018-12-26 SURGICAL SUPPLY — 11 items
CATH 5FR JL3.5 JR4 ANG PIG MP (CATHETERS) ×1 IMPLANT
CATH BALLN WEDGE 5F 110CM (CATHETERS) ×1 IMPLANT
DEVICE RAD COMP TR BAND LRG (VASCULAR PRODUCTS) ×1 IMPLANT
GLIDESHEATH SLEND SS 6F .021 (SHEATH) ×1 IMPLANT
GUIDEWIRE INQWIRE 1.5J.035X260 (WIRE) IMPLANT
INQWIRE 1.5J .035X260CM (WIRE) ×2
KIT HEART LEFT (KITS) ×1 IMPLANT
PACK CARDIAC CATHETERIZATION (CUSTOM PROCEDURE TRAY) ×2 IMPLANT
SHEATH GLIDE SLENDER 4/5FR (SHEATH) ×1 IMPLANT
TRANSDUCER W/STOPCOCK (MISCELLANEOUS) ×2 IMPLANT
TUBING CIL FLEX 10 FLL-RA (TUBING) ×1 IMPLANT

## 2018-12-26 NOTE — Progress Notes (Signed)
Advanced Heart Failure Rounding Note   Subjective:    Says she diuresed well overnight.  No weight recorded. I/Os incomplete. Denies CP or SOB. Renal function stable.   Cath today  Normal coronaries LVEF 60-65%  RA 9 PA 73/24 (45) PCWP 14 Fick CO-CI = 3.8/2.1 PVR 8.0  Arterial sats 78-83% on ABG (sats read 85-87% on pulse ox at the time)  ABG in cath lab (RA): 7.45/71/49/83% - no sedation   Objective:   Weight Range:  Vital Signs:   Temp:  [98 F (36.7 C)-98.6 F (37 C)] 98.6 F (37 C) (10/09 0538) Pulse Rate:  [57-72] 71 (10/09 0854) Resp:  [16-18] 16 (10/09 0854) BP: (102-108)/(65-76) 108/76 (10/09 0538) SpO2:  [80 %-100 %] 93 % (10/09 1005) Last BM Date: 12/25/18  Weight change: Filed Weights   12/24/18 0851  Weight: 71 kg    Intake/Output:   Intake/Output Summary (Last 24 hours) at 12/26/2018 1053 Last data filed at 12/26/2018 0859 Gross per 24 hour  Intake 375 ml  Output 1250 ml  Net -875 ml     Physical Exam: General:  Chronically appearing. No resp difficulty HEENT: normal Neck: supple. JVP jaw . Carotids 2+ bilat; no bruits. No lymphadenopathy or thryomegaly appreciated. Cor: PMI nondisplaced. Regular rate & rhythm. 2/6 TR Lungs: clear with decreased BS Abdomen: soft, nontender, nondistended. No hepatosplenomegaly. No bruits or masses. Good bowel sounds. Extremities: no cyanosis, clubbing, rash, edema Neuro: alert & orientedx3, cranial nerves grossly intact. moves all 4 extremities w/o difficulty. Affect pleasant  Telemetry: Sinus 70s Personally reviewed   Labs: Basic Metabolic Panel: Recent Labs  Lab 12/24/18 0843 12/24/18 1113 12/24/18 1304 12/25/18 0252 12/26/18 0229  NA 136 132* 136 139 138  K 3.3* 6.6* 3.6 3.1* 4.2  CL 86*  --  87* 86* 87*  CO2 36*  --   --  39* 39*  GLUCOSE 134*  --  105* 112* 110*  BUN 20  --  27* 18 24*  CREATININE 1.25*  --  1.00 0.87 0.87  CALCIUM 9.0  --   --  9.0 9.1    Liver Function Tests:  Recent Labs  Lab 12/24/18 1106  AST 29  ALT 23  ALKPHOS 67  BILITOT 0.9  PROT 6.3*  ALBUMIN 3.4*   No results for input(s): LIPASE, AMYLASE in the last 168 hours. No results for input(s): AMMONIA in the last 168 hours.  CBC: Recent Labs  Lab 12/24/18 0843 12/24/18 1113 12/24/18 1304 12/25/18 0252  WBC 4.9  --   --  5.9  NEUTROABS 2.7  --   --   --   HGB 16.1* 18.4* 18.7* 16.5*  HCT 53.4* 54.0* 55.0* 53.8*  MCV 96.6  --   --  95.2  PLT 281  --   --  309    Cardiac Enzymes: No results for input(s): CKTOTAL, CKMB, CKMBINDEX, TROPONINI in the last 168 hours.  BNP: BNP (last 3 results) Recent Labs    03/03/18 1800 12/24/18 0843  BNP 450.1* 909.6*    ProBNP (last 3 results) No results for input(s): PROBNP in the last 8760 hours.    Other results:  Imaging:  No results found.   Medications:     Scheduled Medications: . [MAR Hold] azithromycin  500 mg Oral Daily  . [MAR Hold] enoxaparin (LOVENOX) injection  40 mg Subcutaneous Q24H  . [MAR Hold] nicotine  7 mg Transdermal Daily  . [MAR Hold] predniSONE  40 mg Oral Q  breakfast  . sodium chloride flush  3 mL Intravenous Q12H  . [MAR Hold] spironolactone  25 mg Oral Daily  . [MAR Hold] umeclidinium bromide  1 puff Inhalation Daily     Infusions: . sodium chloride    . sodium chloride 10 mL/hr at 12/26/18 0702     PRN Medications:  sodium chloride, [MAR Hold] acetaminophen **OR** [MAR Hold] acetaminophen, Heparin (Porcine) in NaCl, heparin, iohexol, lidocaine (PF), [MAR Hold] ondansetron **OR** [MAR Hold] ondansetron (ZOFRAN) IV, Radial Cocktail/Verapamil only, sodium chloride flush   Assessment/plan:   1. Acute Hypoxic Respiratory Failure:  - multifactorial  - based on sat and ABG she has severe COPD with CO2 retention - consider PFTs and Pulmoanry consult  2. Acute on Chronic Diastolic HF:  - volume status looks good after diuresis - PCWP 13 - continue torsemide 20 daily - add spiro 12.5  - check BMET as outpatient 1 week   3. 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 Perhaps combination of WHO Group 2 w/ chronic diastolic HF. Prior echocardiograms showed severely elevated PA pressures in the 60s-70s mmHg.  - RHC today with moderate PAH and normal PCWP - Due to WHO Group II disease  - needs O2 and smoking cessation. May need BIPAP - Consider pulmonary inpur  4. Chest Pain:  - cath with normal cors  5. Tobacco Abuse:  - smoking cessation advised   HF team will sign off.   Length of Stay: 0   Glori Bickers  MD 12/26/2018, 10:53 AM  Advanced Heart Failure Team Pager [phone] (M-F; 7a - 4p)  Please contact [facility] for night-coverage after hours (4p -7a ) and weekends on [email or web address]

## 2018-12-26 NOTE — Progress Notes (Addendum)
Family Medicine Teaching Service Daily Progress Note Intern Pager: [phone]  Patient name: Joy [name] record number: [id number] Date of birth: Oct 01, 1960 Age: 58 y.o. Gender: female  Primary Care Provider: Shirley, [name], DO Consultants: None Code Status: DNR  Pt Overview and Major Events to Date:  10/7 - admitted  Assessment and Plan: Joy [name] is a 58 y.o. female presenting with shortness of breath and cough x2 weeks. PMH is significant for COPD, CHF, tobacco abuse, GERD, hypertension.  Hypoxic respiratory failure likely to factorial due to pulmonary hypertension, COPD exacerbation, at HFPEF  Currently on 2 L nasal cannula.  Patient was able to wean to room air yesterday, satting well.  However later in the day we attempted to ambulate the patient on room air with pulse ox and she desatted.  -Monitor respiratory status - 5 days azithro, currently day 2 - Duonebs - prednisone 5 day burst, currently day 2 -CBC/BMP for AM -Wean oxygen as able as treatment progresses -Cardiac monitoring, continuous pulse ox -Incruse Ellipta for COPD - Rt and left heart cath planned for today  Pulmonary hypertension Echo 10/8 shows ejection fraction 70-75%.  Elevated left ventricular end-diastolic pressure.  Right ventricle severely enlarged with moderately reduced, systolic function, with volume and pressure overload.  Right atrial severely dilated. Echo in 2017 with pulmonary artery pressure of 72 mmHg, this had decreased to 61 mmHg on repeat echo in 2019, reference range  <30. -Check PFT's -Cardiology on board, appreciate recommendations  -Left and right heart cath planned for today   Heart failure with preserved ejection fraction Echo 10/8 shows ejection fraction 70-75%.  Elevated left ventricular end-diastolic pressure.  Right ventricle severely enlarged with moderately reduced, systolic function, with volume and pressure overload.  Right atrial severely dilated. . Last ECHO  02/2018 EF 60-65%.  Home medications include lisinopril, torsemide.  Patient chest x-ray without obvious signs of fluid overload on the lungs.  Patient with only trace edema in lower extremities. - Hold lisinopril due to patient AKI -Continue to monitor fluid status -For heart failure recs: Stop p.o. torsemide, add spironolactone 25 mg daily.  -Strict I's and O's  - Morning BMP  - Rt and left heart cath 10/9  Hypokalemia: Resolved Potassium 4.2 on 10/9. - Continue to monitor  Polycythemia, likely hypoxia related Current hemoglobin 16.5 on 10/8. Patient was noted to have an elevated hemoglobin of 18.4 and 18.7 on admission.  On chart review she appears to have a hemoglobin in the normal range up until approximately May 2017, when her hemoglobin was noted to be 15.5.  Since then it seems to consistently be in the 16-18 range.  Patient does state that she snores when sleeping and does feel fatigued throughout the day. -Repeat CBC  10/10 -Consider sleep study as outpatient to look for OSA  GERD Patient not currently on medication for this, chart review reveals she used to take omeprazole 20 for GERD in the past. -Continue to monitor, can consider adding PPI if patient experiences GERD symptoms  Tobacco abuse States she has cut down on her smoking and now is currently smokes 0.5-1 pack/day. -Nicotine patch provided -Recommend tobacco cessation  Hypertension Most recent BP 108/76. Home medications include lisinopril. -Hold home lisinopril due to AKI -Continue to monitor  Pulmonary nodule 50m LLL nodule on Chest CT 2017. Patient is active smoker. - Smoking cessation -Consider further follow-up as outpatient  FEN/GI: Heart Healthy   PPx: Lovenox  Disposition: Pending clinical improvment  Subjective:  Patient  with no complaints, no shortness of breath.  Breathing well on 2 L via nasal cannula.  Plan for cath later this morning.  Objective: Temp:  [98 F (36.7 C)-98.6 F (37  C)] 98.6 F (37 C) (10/09 0538) Pulse Rate:  [57-72] 69 (10/09 0538) Resp:  [16-18] 17 (10/09 0538) BP: (102-108)/(65-76) 108/76 (10/09 0538) SpO2:  [98 %-100 %] 99 % (10/09 0538)   Physical Exam: General: Alert and oriented in no apparent distress Heart: Regular rate and rhythm with no murmurs appreciated Lungs: Some rhonchi right lower lobe, fine crackles noted in lower lung fields bilaterally, breathing comfortably on 2 L nasal cannula Skin: Warm and dry Extremities: Trace lower limb edema bilaterally.    Laboratory: Recent Labs  Lab 12/24/18 0843 12/24/18 1113 12/24/18 1304 12/25/18 0252  WBC 4.9  --   --  5.9  HGB 16.1* 18.4* 18.7* 16.5*  HCT 53.4* 54.0* 55.0* 53.8*  PLT 281  --   --  309   Recent Labs  Lab 12/24/18 0843 12/24/18 1106  12/24/18 1304 12/25/18 0252 12/26/18 0229  NA 136  --    < > 136 139 138  K 3.3*  --    < > 3.6 3.1* 4.2  CL 86*  --   --  87* 86* 87*  CO2 36*  --   --   --  39* 39*  BUN 20  --   --  27* 18 24*  CREATININE 1.25*  --   --  1.00 0.87 0.87  CALCIUM 9.0  --   --   --  9.0 9.1  PROT  --  6.3*  --   --   --   --   BILITOT  --  0.9  --   --   --   --   ALKPHOS  --  67  --   --   --   --   ALT  --  23  --   --   --   --   AST  --  29  --   --   --   --   GLUCOSE 134*  --   --  105* 112* 110*   < > = values in this interval not displayed.    Imaging/Diagnostic Tests:   Lurline Del, DO 12/26/2018, 6:28 AM PGY-1, [place] Intern pager: [phone], text pages welcome

## 2018-12-26 NOTE — Interval H&P Note (Signed)
History and Physical Interval Note:  12/26/2018 10:23 AM  Joy [name]  has presented today for surgery, with the diagnosis of Pulmonary hypertension and chest tightness.  The various methods of treatment have been discussed with the patient and family. After consideration of risks, benefits and other options for treatment, the patient has consented to  Procedure(s): RIGHT/LEFT HEART CATH AND CORONARY ANGIOGRAPHY (N/A) and possible coronary angioplasty as a surgical intervention.  The patient's history has been reviewed, patient examined, no change in status, stable for surgery.  I have reviewed the patient's chart and labs.  Questions were answered to the patient's satisfaction.     Daniel Bensimhon

## 2018-12-26 NOTE — Progress Notes (Signed)
[facility] BAND REMOVAL  LOCATION:    right radial  DEFLATED PER PROTOCOL:    Yes.    TIME BAND OFF / DRESSING APPLIED:    1320   SITE UPON ARRIVAL:    Level 0  SITE AFTER BAND REMOVAL:    Level 0  CIRCULATION SENSATION AND MOVEMENT:    Within Normal Limits   Yes.    COMMENTS:   No use of R hand for 24 hr.

## 2018-12-27 LAB — BASIC METABOLIC PANEL
Anion gap: 16 — ABNORMAL HIGH (ref 5–15)
BUN: 17 mg/dL (ref 6–20)
CO2: 31 mmol/L (ref 22–32)
Calcium: 9.3 mg/dL (ref 8.9–10.3)
Chloride: 89 mmol/L — ABNORMAL LOW (ref 98–111)
Creatinine, Ser: 0.69 mg/dL (ref 0.44–1.00)
GFR calc Af Amer: >60 mL/min (ref >60)
GFR calc non Af Amer: >60 mL/min (ref >60)
Glucose, Bld: 89 mg/dL (ref 70–99)
Potassium: 4.8 mmol/L (ref 3.5–5.1)
Sodium: 136 mmol/L (ref 135–145)

## 2018-12-27 MED ORDER — PNEUMOCOCCAL VAC POLYVALENT 25 MCG/0.5ML IJ INJ: 0.5000 mL | INJECTION | INTRAMUSCULAR | Status: DC

## 2018-12-27 MED ORDER — ORAL CARE MOUTH RINSE
15.0000 mL | Freq: Two times a day (BID) | OROMUCOSAL | Status: DC
  Administered 2018-12-27: 09:00:00 15 mL via OROMUCOSAL

## 2018-12-27 MED ORDER — UMECLIDINIUM BROMIDE 62.5 MCG/INH IN AEPB: 1.0000 | INHALATION_SPRAY | Freq: Every day | RESPIRATORY_TRACT | 0 refills | Status: DC

## 2018-12-27 MED ORDER — SPIRONOLACTONE 25 MG PO TABS: 25.0000 mg | ORAL_TABLET | Freq: Every day | ORAL | 0 refills | Status: DC

## 2018-12-27 MED ORDER — PREDNISONE 20 MG PO TABS: 40.0000 mg | ORAL_TABLET | Freq: Every day | ORAL | 0 refills | Status: AC

## 2018-12-27 NOTE — Progress Notes (Signed)
Family Medicine Teaching Service Daily Progress Note Intern Pager: [phone]  Patient [name] record number: [id number] Date of birth: [birth year] Age: 58 y.o. Gender: female  Primary Care Provider: Shirley, [name], DO Consultants: None Code Status: DNR  Pt Overview and Major Events to Date:  10/7 - admitted  Assessment and Plan: Joy [name] is a 58 y.o. female presenting with shortness of breath and cough x2 weeks. PMH is significant for COPD, CHF, tobacco abuse, GERD, hypertension.  Hypoxic respiratory failure likely to factorial due to pulmonary hypertension, COPD exacerbation, at HFPEF  Patient had right and left heart cath yesterday.  Right heart cath showed moderate pulmonary artery hypertension (WHO group 2 PAH) no role for selective pulmonary vasodilators.  Normal coronaries.   -Heart failure signed off with following recommendations: Based on stat ABG likely has severe COPD with CO2 retention  -Smoking cessation advised  -Continue torsemide 20 daily  -Check BMP as an outpatient in approximately 1 week  -Recommend PFTs and pulmonary consult - 5 days azithro, currently day 3 - Duonebs - prednisone 5 day burst, currently day 3 -Plan to wean oxygen to room air and monitor pulse ox.  If patient's pulse ox remains good will attempt to ambulate patient on room air with pulse ox. -Cardiac monitoring, continuous pulse ox -Incruse Ellipta for COPD  Pulmonary hypertension Echo 10/8 shows ejection fraction 70-75%.  Elevated left ventricular end-diastolic pressure.  Right ventricle severely enlarged with moderately reduced, systolic function, with volume and pressure overload.  Right atrial severely dilated. Echo in 2017 with pulmonary artery pressure of 72 mmHg, this had decreased to 61 mmHg on repeat echo in 2019, reference range  <30. -Plan as above  Heart failure with preserved ejection fraction Echo 10/8 shows ejection fraction 70-75%.  Elevated left  ventricular end-diastolic pressure.  Right ventricle severely enlarged with moderately reduced, systolic function, with volume and pressure overload.  Right atrial severely dilated. . Last ECHO 02/2018 EF 60-65%.  Home medications include lisinopril, torsemide.  Patient chest x-ray without obvious signs of fluid overload on the lungs.  Patient with only trace edema in lower extremities. -Continue to monitor fluid status  Hypokalemia: Resolved Potassium 4.8 on 10/10. - Continue to monitor  Polycythemia, likely hypoxia related Hemoglobin 17.7 on 10/9. Patient was noted to have an elevated hemoglobin of 18.4 and 18.7 on admission.  On chart review she appears to have a hemoglobin in the normal range up until approximately May 2017, when her hemoglobin was noted to be 15.5.  Since then it seems to consistently be in the 16-18 range.  Patient does state that she snores when sleeping and does feel fatigued throughout the day. -Consider sleep study as outpatient to look for OSA  GERD Patient not currently on medication for this, chart review reveals she used to take omeprazole 20 for GERD in the past. -Continue to monitor, can consider adding PPI if patient experiences GERD symptoms  Tobacco abuse States she has cut down on her smoking and now is currently smokes 0.5-1 pack/day. -Nicotine patch provided -Continue to recommend tobacco cessation  Hypertension Most recent blood pressure 101/69, trend over past 24 hours 95-127/55-85 Home medications include lisinopril. -Hold home lisinopril -Continue to monitor  Pulmonary nodule 90m LLL nodule on Chest CT 2017. Patient is active smoker. - Smoking cessation -Consider further follow-up as outpatient  FEN/GI: Heart Healthy   PPx: Lovenox  Disposition: Pending clinical improvment  Subjective:  Patient did not have any complaints this morning,  breathing well on 1 L nasal cannula.  I informed patient that my plan for today was to scale back  her oxygen and see how she does on room air, if she does well like this we will ambulate her with a pulse ox and see how she does.  She agreed with this plan.  I explained to the patient that because of her COPD we were starting her on a new medication, Incruse Ellipta.  She stated that her only question/concern was being able to afford the medication when she leaves as she currently does not have Medicaid.  Objective: Temp:  [98.1 F (36.7 C)] 98.1 F (36.7 C) (10/10 0600) Pulse Rate:  [64-81] 64 (10/10 0600) Resp:  [8-21] 17 (10/10 0600) BP: (95-127)/(55-85) 101/69 (10/10 0600) SpO2:  [0 %-97 %] 95 % (10/10 0956) Weight:  [70.5 kg] 70.5 kg (10/10 0600)   Physical Exam: General: Alert and oriented in no apparent distress Heart: Regular rate and rhythm with no murmurs appreciated Lungs: CTA bilaterally, breathing well on 1 L via nasal cannula. Abdomen: Bowel sounds present, no abdominal pain   Laboratory: Recent Labs  Lab 12/24/18 0843  12/25/18 0252  12/26/18 1046 12/26/18 1051 12/26/18 1438  WBC 4.9  --  5.9  --   --   --  7.3  HGB 16.1*   < > 16.5*   < > 19.4* 18.0* 17.7*  HCT 53.4*   < > 53.8*   < > 57.0* 53.0* 55.9*  PLT 281  --  309  --   --   --  364   < > = values in this interval not displayed.   Recent Labs  Lab 12/24/18 1106  12/25/18 0252 12/26/18 0229  12/26/18 1046 12/26/18 1051 12/26/18 1438 12/27/18 0224  NA  --    < > 139 138   < > 138 140  --  136  K  --    < > 3.1* 4.2   < > 3.4* 2.8*  --  4.8  CL  --    < > 86* 87*  --   --   --   --  89*  CO2  --   --  39* 39*  --   --   --   --  31  BUN  --    < > 18 24*  --   --   --   --  17  CREATININE  --    < > 0.87 0.87  --   --   --  0.71 0.69  CALCIUM  --   --  9.0 9.1  --   --   --   --  9.3  PROT 6.3*  --   --   --   --   --   --   --   --   BILITOT 0.9  --   --   --   --   --   --   --   --   ALKPHOS 67  --   --   --   --   --   --   --   --   ALT 23  --   --   --   --   --   --   --   --   AST  29  --   --   --   --   --   --   --   --  GLUCOSE  --    < > 112* 110*  --   --   --   --  89   < > = values in this interval not displayed.    Imaging/Diagnostic Tests:   Lurline Del, DO 12/27/2018, 10:15 AM PGY-1, [place] Intern pager: [phone], text pages welcome

## 2018-12-27 NOTE — Progress Notes (Signed)
SATURATION QUALIFICATIONS: (This note is used to comply with regulatory documentation for home oxygen)  Patient Saturations on Room Air at Rest = 95%  Patient Saturations on Room Air while Ambulating = 92%  Patient Saturations on 1 Liters of oxygen while Ambulating = 98%  Hiram Comber, RN 12/27/2018 11:33 AM

## 2018-12-27 NOTE — Discharge Instructions (Signed)
You were admitted for shortness of breath and low oxygen saturations.  It was found you have severe COPD with pulmonary hypertension.  Cardiology was consulted and recommended addition of Spironolactone.  You were also started on a new inhaler for your severe COPD.  You will continue on 2 more days of antibiotics and steroids for your breathing.  These medications were sent to the [facility].  You should follow-up with your primary doctor Dr. Enid Derry in the clinic next week.  It is recommended that you obtain lung studies once outside the hospital.

## 2018-12-27 NOTE — Discharge Planning (Signed)
Nsg Discharge Note  Admit Date:  12/24/2018 Discharge date: 12/27/2018   Joy [name] to be D/C'd Home per MD order.  AVS completed.    Discharge Medication: Allergies as of 12/27/2018   No Known Allergies     Medication List    STOP taking these medications   acetaZOLAMIDE 250 MG tablet Commonly known as: DIAMOX   lisinopril 5 MG tablet Commonly known as: ZESTRIL   torsemide 20 MG tablet Commonly known as: DEMADEX     TAKE these medications   albuterol 108 (90 Base) MCG/ACT inhaler Commonly known as: VENTOLIN HFA Inhale 2 puffs into the lungs every 6 (six) hours as needed for wheezing or shortness of breath.   multivitamin with minerals Tabs tablet Take 1 tablet by mouth daily.   predniSONE 20 MG tablet Commonly known as: DELTASONE Take 2 tablets (40 mg total) by mouth daily with breakfast for 2 days. Start taking on: December 28, 2018   spironolactone 25 MG tablet Commonly known as: ALDACTONE Take 1 tablet (25 mg total) by mouth daily. Start taking on: December 28, 2018   umeclidinium bromide 62.5 MCG/INH Aepb Commonly known as: INCRUSE ELLIPTA Inhale 1 puff into the lungs daily. Start taking on: December 28, 2018       Discharge Assessment: Vitals:   12/27/18 0856 12/27/18 0956  BP:    Pulse:    Resp:    Temp:    SpO2: 96% 95%   Skin clean, dry and intact without evidence of skin break down, no evidence of skin tears noted. IV catheter discontinued intact. Site without signs and symptoms of complications - no redness or edema noted at insertion site, patient denies c/o pain - only slight tenderness at site.  Dressing with slight pressure applied.  Right radial band site clean, dry and intact. Patient instructed to remove dressing upon discharge home.   D/c Instructions-Education: Discharge instructions given to patient/family with verbalized understanding. D/c education completed with patient/family including follow up instructions, medication list,  d/c activities limitations if indicated, with other d/c instructions as indicated by MD - patient able to verbalize understanding, all questions fully answered. Patient instructed to return to ED, call 911, or call MD for any changes in condition.  Patient escorted via [place], and D/C home via private auto.  Hiram Comber, RN 12/27/2018 5:58 PM

## 2018-12-27 NOTE — Care Management (Signed)
Spoke w Dr Vanessa Hudspeth, requested DME O2 order to prepare for DC. He states that the plan will be to try to wean and reassess ambulatory sat later today. CM informed MD that O2 may take several hours to get to room if requested late in the day. Please notify CM ASAP if O2 is needed for home.  Carles Collet RN BSN CPN Case Management [phone]

## 2018-12-27 NOTE — TOC Transition Note (Addendum)
Transition of Care [facility]) - CM/SW Discharge Note   Patient Details  Name: Joy [name] MRN: [id number] Date of Birth: Nov 02, 1960  Transition of [facility]) CM/SW Contact:  Carles Collet, RN Phone Number: 12/27/2018, 4:04 PM   Clinical Narrative:   Spoke to patient, discussed how to apply for medicaid and disability. She currently pays out of pocket for medications. We discussed orange card and seeking application for that at her follow up with the clinic. Unclear at this time if she will start any new meds at DC, asked for clarification by 4:45 from resident.   Spoke w resident who will label inhaler dor DC, please send home w patient, other medications can be filled from $4 list from [facility]    Final next level of care: Home/Self Care Barriers to Discharge: No Barriers Identified   Patient Goals and CMS Choice Patient states their goals for this hospitalization and ongoing recovery are:: return home CMS Medicare.gov Compare Post Acute Care list provided to:: Patient    Discharge Placement                       Discharge Plan and Services In-house Referral: Clinical Social Work Discharge Planning Services: CM Consult Post Acute Care Choice: Durable Medical Equipment          DME Arranged: Oxygen DME Agency: [employer] Date DME Agency Contacted: 12/25/18 Time DME Agency Contacted: [phone] Representative spoke with at DME Agency: [place]: NA [place] Agency: NA        Social Determinants of Health ([place]) Interventions     Readmission Risk Interventions No flowsheet data found.

## 2018-12-29 ENCOUNTER — Encounter (HOSPITAL_COMMUNITY): Payer: Self-pay | Admitting: Internal Medicine

## 2018-12-30 ENCOUNTER — Encounter: Payer: Self-pay | Admitting: Family Medicine

## 2018-12-30 ENCOUNTER — Ambulatory Visit (INDEPENDENT_AMBULATORY_CARE_PROVIDER_SITE_OTHER): Payer: Self-pay | Admitting: Family Medicine

## 2018-12-30 ENCOUNTER — Other Ambulatory Visit: Payer: Self-pay

## 2018-12-30 DIAGNOSIS — J439 Emphysema, unspecified: Secondary | ICD-10-CM

## 2018-12-30 DIAGNOSIS — I10 Essential (primary) hypertension: Secondary | ICD-10-CM

## 2018-12-30 DIAGNOSIS — Z720 Tobacco use: Secondary | ICD-10-CM

## 2018-12-30 DIAGNOSIS — I5032 Chronic diastolic (congestive) heart failure: Secondary | ICD-10-CM

## 2018-12-30 MED ORDER — INCRUSE ELLIPTA 62.5 MCG/INH IN AEPB: 1.0000 | INHALATION_SPRAY | Freq: Every day | RESPIRATORY_TRACT | 0 refills | Status: DC

## 2018-12-30 MED ORDER — ALBUTEROL SULFATE HFA 108 (90 BASE) MCG/ACT IN AERS: 2.0000 | INHALATION_SPRAY | RESPIRATORY_TRACT | 1 refills | Status: DC | PRN

## 2018-12-30 NOTE — Progress Notes (Signed)
  Subjective:  Patient ID: Joy [name]  DOB: April 27, 1960 MRN: [id number]  Joy [name] is a 58 y.o. female with a PMH of HTN, HFpEF, COPD, GERD, h/o tobacco use disorder, pulmonary hypertension, here today for hospital follow-up.   HPI:  Hospital follow-up for COPD exacerbation and pulmonary hypertension: -Patient discharged on 10/10.  Reports that since her discharge she has been feeling much better. -She is concerned because some of the medications that they sent her home with she currently has but they are quite expensive and she has no insurance -She states that she is also concerned about having a sleep test done as suggested in the hospital because she does not have insurance. Patient reports that she has not had to use her albuterol very often since being discharged and is feeling better after also stopping smoking.  She is hopeful that she will continue to be able to not smoke anymore. -Patient denies any increased shortness of breath, chest pain, leg swelling -She has finished her prednisone -She also finished her antibiotics  Tobacco use disorder She states that her last cigarette was last Tuesday.  ROS: As mentioned in HPI  Social hx: Denies use of illicit drugs, alcohol use Smoking status reviewed  Patient Active Problem List   Diagnosis Date Noted  . COPD (chronic obstructive pulmonary disease) ([place]) 08/10/2015  . Pulmonary hypertension ([place])   . Congestive heart failure ([place]) 08/08/2015  . Essential hypertension   . Tobacco abuse 05/16/2006  . GASTROESOPHAGEAL REFLUX, NO ESOPHAGITIS 05/16/2006     Objective:  BP 100/62   Pulse 86   Ht _0  (1.676 m)   Wt 170 lb 8 oz (77.3 kg)   SpO2 95%   BMI 27.52 kg/m   Vitals and nursing note reviewed  General: NAD, pleasant Pulm: normal effort, CTAB Cardiac: RRR, normal heart sounds, no m/r/g Extremities: no edema or cyanosis. WWP. Skin: warm and dry, no rashes noted Neuro: alert and oriented, no focal deficits  Psych: normal affect, normal thought content  Assessment & Plan:   Essential hypertension BP well controlled continue current medications  COPD (chronic obstructive pulmonary disease) ([place]) Patient is going to be started on Incruse, however this medication is too expensive.  Patient has no insurance.  She will be applying for the orange card.  Told her to set up an appointment so that she may apply for the orange card.  Given samples of Incruse for 1 month so that this may get her until she can apply for orange card.  Patient also given good Rx coupon for generic version of albuterol in case she runs out of this. Congratulated patient on smoking cessation and encouraged her to continue this.  She will reach out if she needs to.  Tobacco abuse Please see COPD problem   [name] Pearce [place], DO Family Medicine Resident PGY-3

## 2018-12-30 NOTE — Patient Instructions (Signed)
Thank you for coming to see me today. It was a pleasure! Today we talked about:   Congratulations on quitting smoking!  Please let me know if there is anything that I can do in order to help you.  We have given you samples of medication that you will need to take every day.  I have printed a coupon for you in order to pick up albuterol as needed inhaler from [place].  Please schedule patient follow-up with Kennyth Lose in order for her to apply for the orange card.  Please provide her with paperwork for the orange card today. May also schedule her with pharmacy clinic if necessary.  If you have any questions or concerns, please do not hesitate to call the office at [phone].  Take Care,   [name] Koehn Salehi, DO

## 2019-01-01 NOTE — Assessment & Plan Note (Signed)
Patient reports compliance with her spironolactone

## 2019-01-01 NOTE — Assessment & Plan Note (Signed)
BP well controlled continue current medications

## 2019-01-01 NOTE — Assessment & Plan Note (Signed)
Please see COPD problem

## 2019-01-01 NOTE — Assessment & Plan Note (Signed)
Patient is going to be started on Incruse, however this medication is too expensive.  Patient has no insurance.  She will be applying for the orange card.  Told her to set up an appointment so that she may apply for the orange card.  Given samples of Incruse for 1 month so that this may get her until she can apply for orange card.  Patient also given good Rx coupon for generic version of albuterol in case she runs out of this. Congratulated patient on smoking cessation and encouraged her to continue this.  She will reach out if she needs to.

## 2019-01-12 ENCOUNTER — Other Ambulatory Visit: Payer: Self-pay | Admitting: Family Medicine

## 2019-01-28 ENCOUNTER — Other Ambulatory Visit: Payer: Self-pay | Admitting: Family Medicine

## 2019-02-14 ENCOUNTER — Other Ambulatory Visit: Payer: Self-pay | Admitting: Family Medicine

## 2019-04-30 ENCOUNTER — Encounter (HOSPITAL_COMMUNITY): Payer: Self-pay | Admitting: Family Medicine

## 2019-04-30 ENCOUNTER — Inpatient Hospital Stay (HOSPITAL_COMMUNITY)
Admission: EM | Admit: 2019-04-30 | Discharge: 2019-05-04 | DRG: 291 | Disposition: A | Discharge status: Home / Self Care | Payer: Self-pay | Attending: Family Medicine | Admitting: Family Medicine

## 2019-04-30 ENCOUNTER — Other Ambulatory Visit: Payer: Self-pay

## 2019-04-30 ENCOUNTER — Emergency Department (HOSPITAL_COMMUNITY): Payer: Self-pay

## 2019-04-30 DIAGNOSIS — I248 Other forms of acute ischemic heart disease: Secondary | ICD-10-CM | POA: Diagnosis present

## 2019-04-30 DIAGNOSIS — Z9981 Dependence on supplemental oxygen: Secondary | ICD-10-CM

## 2019-04-30 DIAGNOSIS — Z66 Do not resuscitate: Secondary | ICD-10-CM | POA: Diagnosis present

## 2019-04-30 DIAGNOSIS — I272 Pulmonary hypertension, unspecified: Secondary | ICD-10-CM | POA: Diagnosis present

## 2019-04-30 DIAGNOSIS — E876 Hypokalemia: Secondary | ICD-10-CM | POA: Diagnosis not present

## 2019-04-30 DIAGNOSIS — F17210 Nicotine dependence, cigarettes, uncomplicated: Secondary | ICD-10-CM | POA: Diagnosis present

## 2019-04-30 DIAGNOSIS — Z720 Tobacco use: Secondary | ICD-10-CM | POA: Diagnosis present

## 2019-04-30 DIAGNOSIS — I110 Hypertensive heart disease with heart failure: Principal | ICD-10-CM | POA: Diagnosis present

## 2019-04-30 DIAGNOSIS — J9622 Acute and chronic respiratory failure with hypercapnia: Secondary | ICD-10-CM

## 2019-04-30 DIAGNOSIS — Z79899 Other long term (current) drug therapy: Secondary | ICD-10-CM

## 2019-04-30 DIAGNOSIS — J9690 Respiratory failure, unspecified, unspecified whether with hypoxia or hypercapnia: Secondary | ICD-10-CM | POA: Diagnosis present

## 2019-04-30 DIAGNOSIS — I5082 Biventricular heart failure: Secondary | ICD-10-CM | POA: Diagnosis present

## 2019-04-30 DIAGNOSIS — I2721 Secondary pulmonary arterial hypertension: Secondary | ICD-10-CM | POA: Diagnosis present

## 2019-04-30 DIAGNOSIS — I472 Ventricular tachycardia: Secondary | ICD-10-CM | POA: Diagnosis not present

## 2019-04-30 DIAGNOSIS — E873 Alkalosis: Secondary | ICD-10-CM | POA: Diagnosis present

## 2019-04-30 DIAGNOSIS — Z20822 Contact with and (suspected) exposure to covid-19: Secondary | ICD-10-CM | POA: Diagnosis present

## 2019-04-30 DIAGNOSIS — J449 Chronic obstructive pulmonary disease, unspecified: Secondary | ICD-10-CM

## 2019-04-30 DIAGNOSIS — J9601 Acute respiratory failure with hypoxia: Secondary | ICD-10-CM

## 2019-04-30 DIAGNOSIS — I2723 Pulmonary hypertension due to lung diseases and hypoxia: Secondary | ICD-10-CM | POA: Diagnosis present

## 2019-04-30 DIAGNOSIS — K219 Gastro-esophageal reflux disease without esophagitis: Secondary | ICD-10-CM | POA: Diagnosis present

## 2019-04-30 DIAGNOSIS — Z7951 Long term (current) use of inhaled steroids: Secondary | ICD-10-CM

## 2019-04-30 DIAGNOSIS — I50810 Right heart failure, unspecified: Secondary | ICD-10-CM | POA: Diagnosis present

## 2019-04-30 DIAGNOSIS — E8809 Other disorders of plasma-protein metabolism, not elsewhere classified: Secondary | ICD-10-CM | POA: Diagnosis present

## 2019-04-30 DIAGNOSIS — R0600 Dyspnea, unspecified: Secondary | ICD-10-CM | POA: Diagnosis present

## 2019-04-30 DIAGNOSIS — I5033 Acute on chronic diastolic (congestive) heart failure: Secondary | ICD-10-CM | POA: Diagnosis present

## 2019-04-30 DIAGNOSIS — R911 Solitary pulmonary nodule: Secondary | ICD-10-CM | POA: Diagnosis present

## 2019-04-30 DIAGNOSIS — I2781 Cor pulmonale (chronic): Secondary | ICD-10-CM | POA: Diagnosis present

## 2019-04-30 DIAGNOSIS — F1290 Cannabis use, unspecified, uncomplicated: Secondary | ICD-10-CM | POA: Diagnosis present

## 2019-04-30 DIAGNOSIS — J9621 Acute and chronic respiratory failure with hypoxia: Secondary | ICD-10-CM | POA: Diagnosis present

## 2019-04-30 DIAGNOSIS — I50812 Chronic right heart failure: Secondary | ICD-10-CM

## 2019-04-30 LAB — COMPREHENSIVE METABOLIC PANEL
ALT: 16 U/L (ref 0–44)
AST: 23 U/L (ref 15–41)
Albumin: 3.3 g/dL — ABNORMAL LOW (ref 3.5–5.0)
Alkaline Phosphatase: 54 U/L (ref 38–126)
Anion gap: 10 (ref 5–15)
BUN: 6 mg/dL (ref 6–20)
CO2: 29 mmol/L (ref 22–32)
Calcium: 9 mg/dL (ref 8.9–10.3)
Chloride: 101 mmol/L (ref 98–111)
Creatinine, Ser: 0.89 mg/dL (ref 0.44–1.00)
GFR calc Af Amer: >60 mL/min (ref >60)
GFR calc non Af Amer: >60 mL/min (ref >60)
Glucose, Bld: 114 mg/dL — ABNORMAL HIGH (ref 70–99)
Potassium: 3.8 mmol/L (ref 3.5–5.1)
Sodium: 140 mmol/L (ref 135–145)
Total Bilirubin: 1.1 mg/dL (ref 0.3–1.2)
Total Protein: 5.7 g/dL — ABNORMAL LOW (ref 6.5–8.1)

## 2019-04-30 LAB — CBC WITH DIFFERENTIAL/PLATELET
Abs Immature Granulocytes: 0.01 10*3/uL (ref 0.00–0.07)
Basophils Absolute: 0.1 10*3/uL (ref 0.0–0.1)
Basophils Relative: 2 %
Eosinophils Absolute: 0 10*3/uL (ref 0.0–0.5)
Eosinophils Relative: 1 %
HCT: 54.4 % — ABNORMAL HIGH (ref 36.0–46.0)
Hemoglobin: 15.5 g/dL — ABNORMAL HIGH (ref 12.0–15.0)
Immature Granulocytes: 0 %
Lymphocytes Relative: 35 %
Lymphs Abs: 1.7 10*3/uL (ref 0.7–4.0)
MCH: 25.4 pg — ABNORMAL LOW (ref 26.0–34.0)
MCHC: 28.5 g/dL — ABNORMAL LOW (ref 30.0–36.0)
MCV: 89 fL (ref 80.0–100.0)
Monocytes Absolute: 0.6 10*3/uL (ref 0.1–1.0)
Monocytes Relative: 12 %
Neutro Abs: 2.4 10*3/uL (ref 1.7–7.7)
Neutrophils Relative %: 50 %
Platelets: 252 10*3/uL (ref 150–400)
RBC: 6.11 MIL/uL — ABNORMAL HIGH (ref 3.87–5.11)
RDW: 16.4 % — ABNORMAL HIGH (ref 11.5–15.5)
WBC: 4.7 10*3/uL (ref 4.0–10.5)
nRBC: 0 % (ref 0.0–0.2)

## 2019-04-30 LAB — RAPID URINE DRUG SCREEN, HOSP PERFORMED
Amphetamines: NOT DETECTED
Barbiturates: NOT DETECTED
Benzodiazepines: NOT DETECTED
Cocaine: NOT DETECTED
Opiates: NOT DETECTED
Tetrahydrocannabinol: NOT DETECTED

## 2019-04-30 LAB — TROPONIN I (HIGH SENSITIVITY)
Troponin I (High Sensitivity): 102 ng/L (ref <18)
Troponin I (High Sensitivity): 102 ng/L (ref <18)
Troponin I (High Sensitivity): 89 ng/L — ABNORMAL HIGH (ref <18)

## 2019-04-30 LAB — URINALYSIS, ROUTINE W REFLEX MICROSCOPIC
Bilirubin Urine: NEGATIVE
Glucose, UA: NEGATIVE mg/dL
Hgb urine dipstick: NEGATIVE
Ketones, ur: NEGATIVE mg/dL
Leukocytes,Ua: NEGATIVE
Nitrite: NEGATIVE
Protein, ur: NEGATIVE mg/dL
Specific Gravity, Urine: 1.004 — ABNORMAL LOW (ref 1.005–1.030)
pH: 6 (ref 5.0–8.0)

## 2019-04-30 LAB — RESPIRATORY PANEL BY RT PCR (FLU A&B, COVID)
Influenza A by PCR: NEGATIVE
Influenza B by PCR: NEGATIVE
SARS Coronavirus 2 by RT PCR: NEGATIVE

## 2019-04-30 LAB — POCT I-STAT 7, (LYTES, BLD GAS, ICA,H+H)
Acid-Base Excess: 1 mmol/L (ref 0.0–2.0)
Bicarbonate: 29.6 mmol/L — ABNORMAL HIGH (ref 20.0–28.0)
Calcium, Ion: 1.27 mmol/L (ref 1.15–1.40)
HCT: 54 % — ABNORMAL HIGH (ref 36.0–46.0)
Hemoglobin: 18.4 g/dL — ABNORMAL HIGH (ref 12.0–15.0)
O2 Saturation: 96 %
Patient temperature: 98
Potassium: 3.8 mmol/L (ref 3.5–5.1)
Sodium: 139 mmol/L (ref 135–145)
TCO2: 31 mmol/L (ref 22–32)
pCO2 arterial: 60.6 mmHg — ABNORMAL HIGH (ref 32.0–48.0)
pH, Arterial: 7.295 — ABNORMAL LOW (ref 7.350–7.450)
pO2, Arterial: 89 mmHg (ref 83.0–108.0)

## 2019-04-30 LAB — BRAIN NATRIURETIC PEPTIDE: B Natriuretic Peptide: 783.2 pg/mL — ABNORMAL HIGH (ref 0.0–100.0)

## 2019-04-30 LAB — PROTEIN / CREATININE RATIO, URINE
Creatinine, Urine: <10 mg/dL
Total Protein, Urine: <6 mg/dL

## 2019-04-30 LAB — D-DIMER, QUANTITATIVE: D-Dimer, Quant: 0.64 ug/mL-FEU — ABNORMAL HIGH (ref 0.00–0.50)

## 2019-04-30 LAB — HIV ANTIBODY (ROUTINE TESTING W REFLEX): HIV Screen 4th Generation wRfx: NONREACTIVE

## 2019-04-30 LAB — ETHANOL: Alcohol, Ethyl (B): <10 mg/dL (ref <10)

## 2019-04-30 MED ORDER — ACETAMINOPHEN 325 MG PO TABS
650.0000 mg | ORAL_TABLET | Freq: Four times a day (QID) | ORAL | Status: DC | PRN
  Administered 2019-05-02 – 2019-05-03 (×2): 650 mg via ORAL
  Filled 2019-04-30 (×2): qty 2

## 2019-04-30 MED ORDER — ACETAMINOPHEN 650 MG RE SUPP: 650.0000 mg | Freq: Four times a day (QID) | RECTAL | Status: DC | PRN

## 2019-04-30 MED ORDER — ADULT MULTIVITAMIN W/MINERALS CH
1.0000 | ORAL_TABLET | Freq: Every day | ORAL | Status: DC
  Administered 2019-05-01 – 2019-05-04 (×4): 1 via ORAL
  Filled 2019-04-30 (×4): qty 1

## 2019-04-30 MED ORDER — UMECLIDINIUM BROMIDE 62.5 MCG/INH IN AEPB
1.0000 | INHALATION_SPRAY | Freq: Every day | RESPIRATORY_TRACT | Status: DC
  Administered 2019-05-01 – 2019-05-04 (×2): 1 via RESPIRATORY_TRACT
  Filled 2019-04-30 (×2): qty 7

## 2019-04-30 MED ORDER — FUROSEMIDE 10 MG/ML IJ SOLN
40.0000 mg | INTRAMUSCULAR | Status: AC
  Administered 2019-04-30: 11:00:00 40 mg via INTRAVENOUS
  Filled 2019-04-30: qty 4

## 2019-04-30 MED ORDER — ENOXAPARIN SODIUM 40 MG/0.4ML ~~LOC~~ SOLN
40.0000 mg | SUBCUTANEOUS | Status: DC
  Administered 2019-04-30 – 2019-05-03 (×4): 40 mg via SUBCUTANEOUS
  Filled 2019-04-30 (×5): qty 0.4

## 2019-04-30 MED ORDER — FUROSEMIDE 10 MG/ML IJ SOLN
40.0000 mg | Freq: Two times a day (BID) | INTRAMUSCULAR | Status: DC
  Administered 2019-04-30 – 2019-05-01 (×2): 40 mg via INTRAVENOUS
  Filled 2019-04-30 (×2): qty 4

## 2019-04-30 MED ORDER — ALBUTEROL SULFATE (2.5 MG/3ML) 0.083% IN NEBU: 2.5000 mg | INHALATION_SOLUTION | RESPIRATORY_TRACT | Status: DC | PRN

## 2019-04-30 NOTE — H&P (Addendum)
[facility] Admission History and Physical Service Pager: [phone]  Patient name: Joy [name] record number: [id number] Date of birth: 1960-05-07 Age: 59 y.o. Gender: female  Primary Care Provider: Shirley, [name], DO Consultants: None Code Status: DNR Preferred Emergency Contact:  Oneita Hurt, Sister, [phone]  Chief Complaint: Flareup of fluid  Assessment and Plan: Joy [name] is a 59 y.o. female presenting with progressive SOB and difficulty breathing . PMH is significant for CHF with right heart strain, pulmonary hypertension, COPD, hypertension, tobacco use, THC use  Dyspnea without hypoxia Differentials include PE, COPD exacerbation, Pneumonia, CHF exacerbation.   Given that patient has no chest pain or pleuritic pain and progressive worsening of shortness of breath and respiratory acidosis noted on ABG PE less likely but D-dimer pending.   Pneumonia less likely differential given that patient is afebrile, WBC count normal, RPP negative and chest x-ray negative for this process. Shortness of breath not relieved with Albuterol inhaler and no wheezes appreciated on exam, COPD exacerbation seems less likely.  Patient with progressive increasing shortness of breath for 2 weeks with cough producing white phlegm and endorses increased leg swelling and decreased urine output.  Last echo 12/2018 shows left ventricular ejection fraction 70-75% with normal ventricular function, normal size and no ventricular hypertrophy.  Global right ventricule is reduced systolic function and is severely enlarged.  Labs significant for BNP 783.2 and chest x-ray shows hyperexpanded lungs of mild prominence of bronchovascular vascular markings. Patient was given 40 mg Lasix in the ED with good urine output  On exam noted to have mild elevated JVD, mild bibasilar crackles, 4+ pitting edema to her extremities bilaterally. Given this presentation CHF exacerbation seems most  likely etiology of progressive shortness of breath. -Admit to MedSurg, attending Dr. Owens Shark -Continue Lasix 40 mg IV twice daily -Monitor respiratory status -Keep O2 sats between 88-92% -Strict I's and O's -Daily weights  Elevated troponin  Elevated troponin on admission 102 x2.  Repeat troponin 89 likely in setting of CHF exacerbation.  ECG shows no STEMI.  Denies any chest pain -Repeat EKG  COPD Ptakes Albuterol as needed. Last dose was yesterday morning.  Reports that she has not had to increase use of inhaler to help with shortness of breath.  Home medications include albuterol inhaler. -Albuterol as needed -Continue to monitor  HTN Normotensive.  Home medications include spironolactone. -Hold spironolactone -Monitor blood pressure  PHTN Right/left heart cath 12/2018 shows moderate PAH due to severe hypoxic lung disease with CO2 retention.  Hbg 15.5/Hct 54.4.  Home medication acetazolamide 25 mg daily -Encourage smoking cessation -Follow-up pulmonology  FEN/GI:  -Heart Healthy Prophylaxis:  -Lovenox  Disposition: Med-Surg, Attending Dr Owens Shark  History of Present Illness:  Joy [name] is a 59 y.o. female presenting with breath and lower extremity edema.   Patient reports increasing shortness of breath over the last 2 weeks.  Endorses productive cough with white phlegm and exertional dyspnea.  She states that she cannot walk as far as she could prior to onset of shortness of breath.  Denies any fever, chest pain or shortness of breath at rest.  Unsure of any weight gain.  She reports that she thinks the medication she is taking is not making her urinate like it was supposed to.  She reports urinating less in 3-4 times daily.  She sleeps with 2 pillows which is her norm-states she was never able to lie flat.  She does not on any home oxygen and  does not use her inhalers on a regular basis.  She reports she has not had to increase her usage of inhalers over the past 2 weeks.  She  denies any dizziness or weakness, abdominal pain or constipation.  She does report having diarrhea 2 weeks ago that has resolved. She is not followed by any cardiologist on outpatient.  In the ED she was afebrile, RR 15 with oxygen saturations 90% on 2 L nasal cannula, heart rate 65 and regular, blood pressure 127/94.  She was given IV Lasix 40 mg x1 and urinated 2123ms.  Review Of Systems: Per HPI with the following additions:     Review of Systems  Constitutional: Negative for chills, fever and weight loss.  Eyes: Negative for blurred vision.  Respiratory: Positive for cough, sputum production and shortness of breath. Negative for hemoptysis and wheezing.   Cardiovascular: Positive for orthopnea and leg swelling. Negative for chest pain and palpitations.  Gastrointestinal: Negative for abdominal pain, constipation, diarrhea, nausea and vomiting.  Neurological: Negative for dizziness and headaches.    Patient Active Problem List   Diagnosis Date Noted  . COPD (chronic obstructive pulmonary disease) ([place] 08/10/2015  . Pulmonary hypertension ([place]   . HFpEF 08/08/2015  . Essential hypertension   . Tobacco abuse 05/16/2006  . GASTROESOPHAGEAL REFLUX, NO ESOPHAGITIS 05/16/2006    Past Medical History: Past Medical History:  Diagnosis Date  . Acute congestive heart failure ([place]   . Acute dyspnea   . Acute on chronic diastolic CHF (congestive heart failure) ([place] 08/11/2015  . Acute right heart failure ([place] 08/11/2015  . Bilateral lower extremity edema   . CHF (congestive heart failure) ([place]   . COPD (chronic obstructive pulmonary disease) ([place] 08/10/2015   Dx on XRAY 08/08/15.  Needs PFTs   . GASTROESOPHAGEAL REFLUX, NO ESOPHAGITIS 05/16/2006   Qualifier: Diagnosis of  By: KDrucie Ip   . GERD (gastroesophageal reflux disease)   . Hypertension   . Hypervolemia   . Leg swelling hospitalized 08/08/2015   BLE  . PELVIC MASS 07/05/2008   Qualifier: Diagnosis of  By:  McDiarmid MD, TSherren Mocha   . Pulmonary hypertension ([place]   . Right heart failure ([place] 08/10/2015  . Tobacco abuse 05/16/2006   Qualifier: Diagnosis of  By: KDrucie Ip     Past Surgical History: Past Surgical History:  Procedure Laterality Date  . RIGHT/LEFT HEART CATH AND CORONARY ANGIOGRAPHY N/A 12/26/2018   Procedure: RIGHT/LEFT HEART CATH AND CORONARY ANGIOGRAPHY;  Surgeon: BJolaine Artist MD;  [facility];  Service: Cardiovascular;  Laterality: N/A;  . TUBAL LIGATION      Social History: Social History   Tobacco Use  . Smoking status: Current Every Day Smoker    Packs/day: 0.15    Years: 36.00    Pack years: 5.40    Types: Cigarettes  . Smokeless tobacco: Former [employer] developer   Types: Chew  . Tobacco comment: "quit chewing in my early '20s"  Substance Use Topics  . Alcohol use: Yes    Alcohol/week: 23.0 standard drinks    Types: 12 Cans of beer, 11 Shots of liquor per week  . Drug use: Yes    Types: Marijuana    Comment: 08/08/2015 "I smoke marijuana when I have it; probably a couple times/week"   Additional social history: Last cigarette was last Friday. Last drink was Tuesday night. Marijuana use yesterday. Please also refer to relevant sections of EMR.  Family History: No family history  on file. Mom - Aneurysm, deceased  Allergies and Medications: No Known Allergies No current facility-administered medications on file prior to encounter.   Current Outpatient Medications on File Prior to Encounter  Medication Sig Dispense Refill  . albuterol (VENTOLIN HFA) 108 (90 Base) MCG/ACT inhaler Inhale 2-4 puffs into the lungs every 4 (four) hours as needed for wheezing (or cough). 8.5 g 1  . Multiple Vitamin (MULTIVITAMIN WITH MINERALS) TABS tablet Take 1 tablet by mouth daily.    Marland Kitchen spironolactone (ALDACTONE) 25 MG tablet TAKE 1 TABLET BY MOUTH EVERY DAY 30 tablet 2  . umeclidinium bromide (INCRUSE ELLIPTA) 62.5 MCG/INH AEPB Inhale 1 puff into the lungs  daily. 1 each 0  . umeclidinium bromide (INCRUSE ELLIPTA) 62.5 MCG/INH AEPB Inhale 1 puff into the lungs daily. 4 each 0    Objective: BP (!) 139/94   Pulse 77   Temp 98.7 F (37.1 C) (Oral)   Resp 20   SpO2 97%  Exam: General: 59 year old female in no acute distress Neck: Elevated JVD noted Cardiovascular: Regular rate and rhythm, no murmurs or gallops appreciated, distal pulses present Respiratory: Clear to auscultation bilaterally, diminished at bases, no increased work of breathing good cap refill Gastrointestinal: Obese, soft, nontender, nondistended, bowel sounds present Extremities: Bilateral lower extremity edema to knee, 5/5 strength in all extremities Neuro: Alert oriented x4 Psych: Pleasant and cooperative, obeys commands and answers appropriately  Labs and Imaging: CBC BMET  Recent Labs  Lab 04/30/19 1022 04/30/19 1022 04/30/19 1056  WBC 4.7  --   --   HGB 15.5*   < > 18.4*  HCT 54.4*   < > 54.0*  PLT 252  --   --    < > = values in this interval not displayed.   Recent Labs  Lab 04/30/19 1022 04/30/19 1022 04/30/19 1056  NA 140   < > 139  K 3.8   < > 3.8  CL 101  --   --   CO2 29  --   --   BUN 6  --   --   CREATININE 0.89  --   --   GLUCOSE 114*  --   --   CALCIUM 9.0  --   --    < > = values in this interval not displayed.     EKG: Vent. rate 88 BPM PR interval 152 ms QRS duration 90 ms QT/QTc 364/440 ms P-R-T axes 82 102 -88 Normal sinus rhythm Biatrial enlargement Pulmonary disease pattern Right ventricular hypertrophy ST & T wave abnormality, consider inferior ischemia ST & T wave abnormality, consider anterolateral ischemia Abnormal ECG T wave abnormality is chronic and seen on multiple prior ECG's Confirmed by Noemi Chapel [phone]) on 04/30/2019 10:13:26 AM 55m/s 159mmV 100Hz 9.0.4 12SL 243 CID: [id number] By: BrNoemi ChapelDG Chest Port 1 View  Result Date: 04/30/2019 CLINICAL DATA:  Hypoxia.  Short of breath for a week.  EXAM: PORTABLE CHEST 1 VIEW COMPARISON:  12/24/2018 FINDINGS: Mild enlargement of the cardiopericardial silhouette. No mediastinal or hilar masses. Enlarged pulmonary arteries. Lungs are hyperexpanded with mild prominence of the bronchovascular markings. Lungs otherwise clear. No convincing pleural effusion and no pneumothorax. Skeletal structures are grossly intact. IMPRESSION: 1. No acute cardiopulmonary disease. 2. Mild cardiomegaly. Enlarged pulmonary arteries suggest pulmonary hypertension. Lung hyperexpansion suggesting COPD. Electronically Signed   By: DaLajean Manes.D.   On: 04/30/2019 11:02    WaCarollee LeitzMD 04/30/2019, 1:36 PM PGY1, [place]  Intern pager: [phone], text pages welcome

## 2019-04-30 NOTE — ED Provider Notes (Signed)
[facility] EMERGENCY DEPARTMENT Provider Note   CSN: [id number] Arrival date & time: 04/30/19  1001     History Chief Complaint  Patient presents with  . Shortness of Breath    Joy [name] is a 59 y.o. female.  HPI    This patient is a 59 year old female who has a known history of congestive heart failure as well as COPD who presents with shortness of breath which has been progressive over the last 2 weeks, she has associated swelling of her legs, this is gradually worsening and has become severe.  She denies coughing or fevers but does have some increasing frothy sputum, decreased urinary output despite taking her diuretics.  She also has COPD and has been using her albuterol inhalers but states it is not doing very much for her shortness of breath.  This gets much worse when she walks and when she lies down at night.  She is no longer able to wear her shoes or even buckle her pants because of increased swelling.  Denies fevers significant coughing or Covid exposures.   I have reviewed the medical record, the patient was admitted to the hospital approximately 3-1/2 to 4 months ago with a COPD exacerbation, she had been admitted the year prior with similar.  Medication list reviewed, the patient does take spironolactone 25 mg, I do not see any other diuretics on her list.  Right and left heart cath performed October 2020 showing normal coronary arteries with an ejection fraction of 60 to 65%.  She did have noted moderate pulmonary arterial hypertension due to severe hypoxic lung disease and CO2 retention  Past Medical History:  Diagnosis Date  . Acute congestive heart failure ([place])   . Acute dyspnea   . Acute on chronic diastolic CHF (congestive heart failure) ([place]) 08/11/2015  . Acute right heart failure ([place]) 08/11/2015  . Bilateral lower extremity edema   . CHF (congestive heart failure) ([place])   . COPD (chronic obstructive pulmonary disease) ([place]) 08/10/2015    Dx on XRAY 08/08/15.  Needs PFTs   . GASTROESOPHAGEAL REFLUX, NO ESOPHAGITIS 05/16/2006   Qualifier: Diagnosis of  By: Drucie Ip    . GERD (gastroesophageal reflux disease)   . Hypertension   . Hypervolemia   . Leg swelling hospitalized 08/08/2015   BLE  . PELVIC MASS 07/05/2008   Qualifier: Diagnosis of  By: McDiarmid MD, Sherren Mocha    . Pulmonary hypertension ([place])   . Right heart failure ([place]) 08/10/2015  . Tobacco abuse 05/16/2006   Qualifier: Diagnosis of  By: Drucie Ip      Patient Active Problem List   Diagnosis Date Noted  . COPD (chronic obstructive pulmonary disease) ([place]) 08/10/2015  . Pulmonary hypertension ([place])   . HFpEF 08/08/2015  . Essential hypertension   . Tobacco abuse 05/16/2006  . GASTROESOPHAGEAL REFLUX, NO ESOPHAGITIS 05/16/2006    Past Surgical History:  Procedure Laterality Date  . RIGHT/LEFT HEART CATH AND CORONARY ANGIOGRAPHY N/A 12/26/2018   Procedure: RIGHT/LEFT HEART CATH AND CORONARY ANGIOGRAPHY;  Surgeon: Jolaine Artist, MD;  Location: [facility];  Service: Cardiovascular;  Laterality: N/A;  . TUBAL LIGATION       OB History   No obstetric history on file.     No family history on file.  Social History   Tobacco Use  . Smoking status: Current Every Day Smoker    Packs/day: 0.15    Years: 36.00    Pack years: 5.40  Types: Cigarettes  . Smokeless tobacco: Former Systems developer    Types: Chew  . Tobacco comment: "quit chewing in my early '20s"  Substance Use Topics  . Alcohol use: Yes    Alcohol/week: 23.0 standard drinks    Types: 12 Cans of beer, 11 Shots of liquor per week  . Drug use: Yes    Types: Marijuana    Comment: 08/08/2015 "I smoke marijuana when I have it; probably a couple times/week"    Home Medications Prior to Admission medications   Medication Sig Start Date End Date Taking? Authorizing Provider  albuterol (VENTOLIN HFA) 108 (90 Base) MCG/ACT inhaler Inhale 2-4 puffs into the lungs every 4 (four) hours  as needed for wheezing (or cough). 12/30/18   Shirley, [name], DO  Multiple Vitamin (MULTIVITAMIN WITH MINERALS) TABS tablet Take 1 tablet by mouth daily.    [provider]  spironolactone (ALDACTONE) 25 MG tablet TAKE 1 TABLET BY MOUTH EVERY DAY 02/17/19   Shirley, [name], DO  umeclidinium bromide (INCRUSE ELLIPTA) 62.5 MCG/INH AEPB Inhale 1 puff into the lungs daily. 12/28/18   Rory Percy, DO  umeclidinium bromide (INCRUSE ELLIPTA) 62.5 MCG/INH AEPB Inhale 1 puff into the lungs daily. 12/30/18   Shirley, [name], DO    Allergies    Patient has no known allergies.  Review of Systems   Review of Systems  All other systems reviewed and are negative.   Physical Exam Updated Vital Signs BP 139/90 (BP Location: Right Arm)   Pulse 91   Temp 98.7 F (37.1 C) (Oral)   Resp 20   SpO2 95%   Physical Exam Vitals and nursing note reviewed.  Constitutional:      General: She is in acute distress.     Appearance: She is well-developed. She is ill-appearing.  HENT:     Head: Normocephalic and atraumatic.     Mouth/Throat:     Pharynx: No oropharyngeal exudate.  Eyes:     General: No scleral icterus.       Right eye: No discharge.        Left eye: No discharge.     Conjunctiva/sclera: Conjunctivae normal.     Pupils: Pupils are equal, round, and reactive to light.  Neck:     Thyroid: No thyromegaly.     Vascular: No JVD.     Comments: JVD is present Cardiovascular:     Rate and Rhythm: Normal rate and regular rhythm.     Heart sounds: Murmur ( Systolic) present. No friction rub. No gallop.   Pulmonary:     Effort: Respiratory distress present.     Breath sounds: No wheezing.     Comments: Lung sounds are relatively clear, the patient is able to speak in shortened sentences, no obvious wheezing or rales Abdominal:     General: Bowel sounds are normal. There is no distension.     Palpations: Abdomen is soft. There is no mass.     Tenderness: There is no abdominal  tenderness.     Comments: Anasarca present on the abdominal wall up to the umbilicus  Musculoskeletal:        General: No tenderness. Normal range of motion.     Cervical back: Normal range of motion and neck supple.     Right lower leg: Edema present.     Left lower leg: Edema present.     Comments: 3+ symmetrical pitting edema of the lower extremities below the knees, edema decreases as it ascends the legs onto the  abdominal wall  Lymphadenopathy:     Cervical: No cervical adenopathy.  Skin:    General: Skin is warm and dry.     Findings: No erythema or rash.  Neurological:     Mental Status: She is alert.     Coordination: Coordination normal.     Comments: The patient is able to ambulate, she moves slowly, she speaks slowly and in shortened sentences but moves all 4 extremities with normal strength, no facial droop  Psychiatric:        Behavior: Behavior normal.     ED Results / Procedures / Treatments   Labs (all labs ordered are listed, but only abnormal results are displayed) Labs Reviewed - No data to display  EKG EKG Interpretation  Date/Time:  Thursday April 30 2019 10:03:27 EST Ventricular Rate:  88 PR Interval:  152 QRS Duration: 90 QT Interval:  364 QTC Calculation: 440 R Axis:   102 Text Interpretation: Normal sinus rhythm Biatrial enlargement Pulmonary disease pattern Right ventricular hypertrophy ST & T wave abnormality, consider inferior ischemia ST & T wave abnormality, consider anterolateral ischemia Abnormal ECG T wave abnormality is chronic and seen on multiple prior ECG's Confirmed by Noemi Chapel [phone]) on 04/30/2019 10:13:26 AM   Radiology No results found.  Procedures .Critical Care Performed by: Noemi Chapel, MD Authorized by: Noemi Chapel, MD   Critical care provider statement:    Critical care time (minutes):  35   Critical care time was exclusive of:  Separately billable procedures and treating other patients and teaching time    Critical care was necessary to treat or prevent imminent or life-threatening deterioration of the following conditions:  Respiratory failure   Critical care was time spent personally by me on the following activities:  Blood draw for specimens, development of treatment plan with patient or surrogate, discussions with consultants, evaluation of patient's response to treatment, examination of patient, obtaining history from patient or surrogate, ordering and performing treatments and interventions, ordering and review of laboratory studies, ordering and review of radiographic studies, pulse oximetry, re-evaluation of patient's condition and review of old charts   (including critical care time)  Medications Ordered in ED Medications  furosemide (LASIX) injection 40 mg (has no administration in time range)    ED Course  I have reviewed the triage vital signs and the nursing notes.  Pertinent labs & imaging results that were available during my care of the patient were reviewed by me and considered in my medical decision making (see chart for details).  Clinical Course as of Apr 29 1309  Thu Apr 30, 2019  1250 B Natriuretic Peptide(!): 783.2 [BM]    Clinical Course User Index [BM] Noemi Chapel, MD   MDM Rules/Calculators/A&P                      This patient has known pulmonary disease and has a pulmonary pattern on her EKG, there is no changes from prior, my interpretation is that the T wave abnormalities have been seen on prior and likely related to chronic lung disease and pulmonary artery hypertension.  After review of the medical record it is clear that this patient has decompensated lung disease and does require recurrent admissions to the hospital.  At this time her oxygen on my exam is 70% on room air, she requires 2 to 4 L which brings her up to 90%.  She does not have significant abnormal lung sounds raising the question of whether there is  pulmonary edema, COPD or some other  etiology.  She is not tachycardic or febrile, she will need to be tested for Covid for pneumonia for effusions as well as consideration of congestive heart failure exacerbation given her significant fluid retention.  I do not see a diuretic in her medication list other than spironolactone, will check renal function and dose Lasix.  The patient is agreeable to the plan, she will need to be admitted, she is in acute hypoxic respiratory failure and is critically ill.   The patient has a chest x-ray that according to my interpretation shows some enlarged pulmonary arteries and mild cardiomegaly, I do not see any pulmonary edema, effusions, pneumothorax or obvious infiltrate.  Labs unremarkable except for an ABG showing some hypercapnic respiratory acidosis, urinalysis is clear, Covid is negative, troponin is elevated at 102 which I suspect is related to congestive heart failure given recent coronary cath.  BNP is elevated at 780  I discussed the care with the family practice physicians who will admit the patient to the hospital  Joy [name] was evaluated in Emergency Department on 04/30/2019 for the symptoms described in the history of present illness. She was evaluated in the context of the global COVID-19 pandemic, which necessitated consideration that the patient might be at risk for infection with the SARS-CoV-2 virus that causes COVID-19. Institutional protocols and algorithms that pertain to the evaluation of patients at risk for COVID-19 are in a state of rapid change based on information released by regulatory bodies including the CDC and federal and state organizations. These policies and algorithms were followed during the patient's care in the ED.   Final Clinical Impression(s) / ED Diagnoses Final diagnoses:  Acute respiratory failure with hypoxia ([place])  Acute on chronic respiratory failure with hypercapnia ([facility])      Noemi Chapel, MD 04/30/19 1312

## 2019-04-30 NOTE — Evaluation (Signed)
Physical Therapy Evaluation Patient Details Name: Joy [name] MRN: [id number] DOB: June 22, 1960 Today's Date: 04/30/2019   History of Present Illness  59 y.o. female presenting with progressive SOB and difficulty breathing . PMH is significant for CHF with right heart strain, pulmonary hypertension, COPD, hypertension, tobacco use, THC use  Clinical Impression  Pt presents to PT with reports of progressive SOB and swelling. On evaluation pt denies all SOB, even when mobilizing on room air. Pt is able to mobilize independently for household distances and reports she is near her baseline, declining the need for stair negotiation assessment. Pt with noted desaturation on room air noted below in general comments. Pt requires no further acute PT POC as she is asymptomatic and independent in all mobility. If patient mobility changes or patient becomes symptomatic with mobility please re-consult acute PT services. Acute PT signing off 04/30/2019.     Follow Up Recommendations No PT follow up    Equipment Recommendations  None recommended by PT    Recommendations for Other Services       Precautions / Restrictions Precautions Precautions: None Restrictions Weight Bearing Restrictions: No      Mobility  Bed Mobility Overal bed mobility: Independent                Transfers Overall transfer level: Independent                  Ambulation/Gait Ambulation/Gait assistance: Independent              Financial trader Rankin (Stroke Patients Only)       Balance Overall balance assessment: Independent                                           Pertinent Vitals/Pain Pain Assessment: Faces Faces Pain Scale: Hurts a little bit Pain Location: LEs Pain Descriptors / Indicators: Sore Pain Intervention(s): Limited activity within patient's tolerance    Home Living Family/patient expects to be discharged to::  Private residence Living Arrangements: Spouse/significant other Available Help at Discharge: Family;Available 24 hours/day Type of Home: House Home Access: Stairs to enter Entrance Stairs-Rails: Can reach both Entrance Stairs-Number of Steps: 4 Home Layout: One level Home Equipment: None      Prior Function Level of Independence: Independent               Hand Dominance   Dominant Hand: Right    Extremity/Trunk Assessment   Upper Extremity Assessment Upper Extremity Assessment: Overall WFL for tasks assessed    Lower Extremity Assessment Lower Extremity Assessment: Overall WFL for tasks assessed    Cervical / Trunk Assessment Cervical / Trunk Assessment: Normal  Communication   Communication: No difficulties  Cognition Arousal/Alertness: Awake/alert Behavior During Therapy: WFL for tasks assessed/performed Overall Cognitive Status: Within Functional Limits for tasks assessed                                        General Comments General comments (skin integrity, edema, etc.): pt initially on PT arrival, pt asymptomatic during mobility, PT weans O2 to room air. Upon return from ambulating pt saturating in low 80s with reliable pleth reading despite remaining asymptomatic. PT places pt back on  2L [place]    Exercises     Assessment/Plan    PT Assessment Patent does not need any further PT services  PT Problem List         PT Treatment Interventions      PT Goals (Current goals can be found in the Care Plan section)       Frequency     Barriers to discharge        Co-evaluation               AM-PAC PT "6 Clicks" Mobility  Outcome Measure Help needed turning from your back to your side while in a flat bed without using bedrails?: None Help needed moving from lying on your back to sitting on the side of a flat bed without using bedrails?: None Help needed moving to and from a bed to a chair (including a wheelchair)?: None Help  needed standing up from a chair using your arms (e.g., wheelchair or bedside chair)?: None Help needed to walk in hospital room?: None Help needed climbing 3-5 steps with a railing? : None 6 Click Score: 24    End of Session Equipment Utilized During Treatment: Oxygen Activity Tolerance: Patient tolerated treatment well Patient left: in bed;with call bell/phone within reach Nurse Communication: Mobility status      Time: 5825-1898 PT Time Calculation (min) (ACUTE ONLY): 25 min   Charges:   PT Evaluation $PT Eval Low Complexity: [place], PT, DPT Acute Rehabilitation Pager: [phone]   Zenaida Niece 04/30/2019, 5:20 PM

## 2019-04-30 NOTE — ED Triage Notes (Signed)
Pt reports worsening SOB over the last 2 weeks. Reports hx of CHF. Feels like her medications may not be working.

## 2019-05-01 ENCOUNTER — Inpatient Hospital Stay (HOSPITAL_COMMUNITY): Payer: Self-pay

## 2019-05-01 DIAGNOSIS — I2609 Other pulmonary embolism with acute cor pulmonale: Secondary | ICD-10-CM

## 2019-05-01 DIAGNOSIS — I2721 Secondary pulmonary arterial hypertension: Secondary | ICD-10-CM

## 2019-05-01 DIAGNOSIS — R7989 Other specified abnormal findings of blood chemistry: Secondary | ICD-10-CM

## 2019-05-01 LAB — COMPREHENSIVE METABOLIC PANEL
ALT: 17 U/L (ref 0–44)
AST: 24 U/L (ref 15–41)
Albumin: 3.1 g/dL — ABNORMAL LOW (ref 3.5–5.0)
Alkaline Phosphatase: 54 U/L (ref 38–126)
Anion gap: 7 (ref 5–15)
BUN: 7 mg/dL (ref 6–20)
CO2: 38 mmol/L — ABNORMAL HIGH (ref 22–32)
Calcium: 9 mg/dL (ref 8.9–10.3)
Chloride: 98 mmol/L (ref 98–111)
Creatinine, Ser: 0.92 mg/dL (ref 0.44–1.00)
GFR calc Af Amer: >60 mL/min (ref >60)
GFR calc non Af Amer: >60 mL/min (ref >60)
Glucose, Bld: 100 mg/dL — ABNORMAL HIGH (ref 70–99)
Potassium: 4.2 mmol/L (ref 3.5–5.1)
Sodium: 143 mmol/L (ref 135–145)
Total Bilirubin: 1.2 mg/dL (ref 0.3–1.2)
Total Protein: 5.6 g/dL — ABNORMAL LOW (ref 6.5–8.1)

## 2019-05-01 LAB — CBC
HCT: 56.3 % — ABNORMAL HIGH (ref 36.0–46.0)
Hemoglobin: 16.5 g/dL — ABNORMAL HIGH (ref 12.0–15.0)
MCH: 26 pg (ref 26.0–34.0)
MCHC: 29.3 g/dL — ABNORMAL LOW (ref 30.0–36.0)
MCV: 88.7 fL (ref 80.0–100.0)
Platelets: 250 10*3/uL (ref 150–400)
RBC: 6.35 MIL/uL — ABNORMAL HIGH (ref 3.87–5.11)
RDW: 17 % — ABNORMAL HIGH (ref 11.5–15.5)
WBC: 5.2 10*3/uL (ref 4.0–10.5)
nRBC: 0 % (ref 0.0–0.2)

## 2019-05-01 LAB — BASIC METABOLIC PANEL
Anion gap: 8 (ref 5–15)
BUN: 5 mg/dL — ABNORMAL LOW (ref 6–20)
CO2: 43 mmol/L — ABNORMAL HIGH (ref 22–32)
Calcium: 9 mg/dL (ref 8.9–10.3)
Chloride: 92 mmol/L — ABNORMAL LOW (ref 98–111)
Creatinine, Ser: 0.95 mg/dL (ref 0.44–1.00)
GFR calc Af Amer: >60 mL/min (ref >60)
GFR calc non Af Amer: >60 mL/min (ref >60)
Glucose, Bld: 66 mg/dL — ABNORMAL LOW (ref 70–99)
Potassium: 3.5 mmol/L (ref 3.5–5.1)
Sodium: 143 mmol/L (ref 135–145)

## 2019-05-01 LAB — HEMOGLOBIN A1C
Hgb A1c MFr Bld: 6.4 % — ABNORMAL HIGH (ref 4.8–5.6)
Mean Plasma Glucose: 136.98 mg/dL

## 2019-05-01 LAB — MAGNESIUM: Magnesium: 1.7 mg/dL (ref 1.7–2.4)

## 2019-05-01 MED ORDER — FUROSEMIDE 10 MG/ML IJ SOLN
80.0000 mg | Freq: Two times a day (BID) | INTRAMUSCULAR | Status: AC
  Administered 2019-05-01 – 2019-05-03 (×5): 80 mg via INTRAVENOUS
  Filled 2019-05-01 (×5): qty 8

## 2019-05-01 MED ORDER — MAGNESIUM SULFATE 2 GM/50ML IV SOLN
2.0000 g | Freq: Once | INTRAVENOUS | Status: AC
  Administered 2019-05-01: 18:00:00 2 g via INTRAVENOUS
  Filled 2019-05-01: qty 50

## 2019-05-01 MED ORDER — METOLAZONE 2.5 MG PO TABS
2.5000 mg | ORAL_TABLET | Freq: Once | ORAL | Status: AC
  Administered 2019-05-01: 2.5 mg via ORAL
  Filled 2019-05-01: qty 1

## 2019-05-01 MED ORDER — FUROSEMIDE 10 MG/ML IJ SOLN
40.0000 mg | Freq: Once | INTRAMUSCULAR | Status: AC
  Administered 2019-05-01: 14:00:00 40 mg via INTRAVENOUS
  Filled 2019-05-01: qty 4

## 2019-05-01 NOTE — TOC Initial Note (Signed)
Transition of Care [facility]) - Initial/Assessment Note    Patient Details  Name: Joy [name] MRN: [id number] Date of Birth: 05/06/1960  Transition of Care [facility]) CM/SW Contact:    Sherrilyn Rist Advanced Care Supervisor Phone Number: [phone] 05/01/2019, 11:41 AM  Clinical Narrative:                 CM talked to patient; stated that she lives at home with spouse (noted not listed on face sheet); PCP is Dr Shirley [name]; no medical insurance she stated that she has not filed for [facility] or disability and is working part time at [employer] 5 days a week (4 hrs a day); She use the bus for transportation or her son with take her to her apts. [facility] [address]; No DME at home; CM/SW will continue to follow for progression of care; Financial Counselor to see pt.  Expected Discharge Plan: Home/Self Care Barriers to Discharge: No Barriers Identified   Patient Goals and CMS Choice Patient states their goals for this hospitalization and ongoing recovery are:: to go back to work      Expected Discharge Plan and Services Expected Discharge Plan: Home/Self Care In-house Referral: Financial Counselor Discharge Planning Services: CM Consult   Living arrangements for the past 2 months: Single Family Home                                      Prior Living Arrangements/Services Living arrangements for the past 2 months: Single Family Home Lives with:: Spouse Patient language and need for interpreter reviewed:: No Do you feel safe going back to the place where you live?: Yes      Need for Family Participation in Patient Care: No (Comment) Care giver support system in place?: Yes (comment)   Criminal Activity/Legal Involvement Pertinent to Current Situation/Hospitalization: No - Comment as needed  Activities of Daily Living Home Assistive Devices/Equipment: None ADL Screening (condition at time of admission) Patient's cognitive ability adequate  to safely complete daily activities?: Yes Is the patient deaf or have difficulty hearing?: No Does the patient have difficulty seeing, even when wearing glasses/contacts?: No Does the patient have difficulty concentrating, remembering, or making decisions?: No Patient able to express need for assistance with ADLs?: Yes Does the patient have difficulty dressing or bathing?: No Independently performs ADLs?: Yes (appropriate for developmental age) Does the patient have difficulty walking or climbing stairs?: Yes Weakness of Legs: Both Weakness of Arms/Hands: None  Permission Sought/Granted Permission sought to share information with : Case Manager Permission granted to share information with : Yes, Verbal Permission Granted              Emotional Assessment Appearance:: Developmentally appropriate Attitude/Demeanor/Rapport: Gracious Affect (typically observed): Pleasant Orientation: : Oriented to Self, Oriented to Place, Oriented to  Time, Oriented to Situation Alcohol / Substance Use: Not Applicable Psych Involvement: No (comment)  Admission diagnosis:  Dyspnea [R06.00] Acute respiratory failure with hypoxia ([place]) [J96.01] Acute on chronic respiratory failure with hypercapnia ([facility]) [J96.22] Patient Active Problem List   Diagnosis Date Noted  . Dyspnea 04/30/2019  . COPD (chronic obstructive pulmonary disease) ([place]) 08/10/2015  . Pulmonary hypertension ([place])   . HFpEF 08/08/2015  . Essential hypertension   . Tobacco abuse 05/16/2006  . GASTROESOPHAGEAL REFLUX, NO ESOPHAGITIS 05/16/2006   PCP:  Shirley, [name], DO [facility]acy[id number] [place] [place] -  [place] [address] Phone: [phone] Fax: [phone]     Social Determinants of Health (SDOH) Interventions    Readmission Risk Interventions No flowsheet data found.

## 2019-05-01 NOTE — Discharge Summary (Addendum)
[facility] Discharge Summary  Patient name: Joy [name] record number: [id number] Date of birth: [birth year] Age: 59 y.o. Gender: female Date of Admission: 04/30/2019  Date of Discharge: 05/04/2019 Admitting Physician: Carollee Leitz, MD  Primary Care Provider: Shirley, [name], DO Consultants: Heart failure  Indication for Hospitalization: Dyspnea without hypoxia  Discharge Diagnoses/Problem List:  PHTN with right sided heart failure Dyspnea without hypoxia Elevated troponin  COPD HTN  Disposition: Home  Discharge Condition: Stable  Discharge Exam:   General: Appears well, no acute distress. Age appropriate. Sitting up in bed w/ HOB elevated  Cardiac: RRR, normal heart sounds, no murmurs Respiratory: CTAB, normal effort Extremities: LE edema w/ unna boots on Skin: Warm and dry, no rashes noted Neuro: alert and oriented, no focal deficits Psych: normal affect  Brief Hospital Course:  [name] a 59 y.o.femalepresenting with progressiveSOB and difficulty breathing. PMH is significant for CHF with right heart strain, pulmonary hypertension, COPD, hypertension, tobacco use, THC use  PHTN with right sided heart failure  Patient with progressive increasing shortness of breath for 2 weeks with cough producing white phlegm and endorses increased leg swelling and decreased urine output.    Labs significant for BNP 783.2 and chest x-ray shows hyperexpanded lungs of mild prominence of bronchovascular vascular markings. Patient was given 40 mg Lasix in the ED with good urine output.  During admission cardiology heart failure was consulted. ECHO w/ LEVF of 60-65% left ventricle demonstrated regional wallmotion abnormalities interventricular septum is flattened in systole and diastole, consistent with right ventricular pressure and volume overload.  She was started on Lasix 28m IV BID which was increased to 875mBID and then transitioned to  torsemide 40 daily and acetazolamide. She continued to require 2L O2 and was discharge with oxygen. Day of discharge she was ambulated with pulse ox and satting 98% on 2L. She was down 12.4kg since admission. Per [facility] recs she was discharged home on Torsemide 4067maily, Spironolactone 89m66mily, and Diamox 250mg42mly. She had not been taking Sprionolactone or Diamox at home recently but had been prescribed these medications in the past. It was also recommended that she follow up with pulmonology to evaluate her for appropriateness of vasodilators and a sleep study.   COPD Continued on Incruse daily and albuterol as needed. O2 was weaned as tolerated.    Elevated troponin  Elevated troponin on admission 102 x2.  Repeat troponin 89 likely in setting of respiratory exacerbation.  ECG shows no STEMI.  Denies any chest pain 7 Beats of VT on EKG 2/14; no cardiac symptoms during admission.  Issues for Follow Up:  1. Discharged home on torsemide and acetozolamide. 2. Follow-up w/ pulmonology. Needs pulmonary function tests. 3. Will need a patient sleep study. 4. HbA1c 6.4, monitor for diabetic needs    Significant Procedures: ECHO  Significant Labs and Imaging:  Recent Labs  Lab 04/30/19 1022 04/30/19 1056 05/01/19 0429  WBC 4.7  --  5.2  HGB 15.5* 18.4* 16.5*  HCT 54.4* 54.0* 56.3*  PLT 252  --  250   Recent Labs  Lab 04/30/19 1022 04/30/19 1022 04/30/19 1056 05/01/19 0429  NA 140  --  139 143  K 3.8   < > 3.8 4.2  CL 101  --   --  98  CO2 29  --   --  38*  GLUCOSE 114*  --   --  100*  BUN 6  --   --  7  CREATININE 0.89  --   --  0.92  CALCIUM 9.0  --   --  9.0  ALKPHOS 54  --   --  54  AST 23  --   --  24  ALT 16  --   --  17  ALBUMIN 3.3*  --   --  3.1*   < > = values in this interval not displayed.   ECHOCARDIOGRAM REPORT    IMPRESSIONS  1. Left ventricular ejection fraction, by estimation, is 60 to 65%. The left ventricle has normal function. The left  ventricle demonstrates regional wall motion abnormalities (see scoring diagram/findings for description). Left ventricular diastolic function could not be evaluated. There is the interventricular septum is flattened in systole and diastole, consistent with right ventricular pressure and volume overload.  2. Right ventricular systolic function is moderately reduced. The right ventricular size is severely enlarged. There is mildly elevated pulmonary artery systolic pressure. The estimated right ventricular systolic pressure is 91.7 mmHg.  3. Right atrial size was severely dilated.  4. The mitral valve is grossly normal. Trivial mitral valve regurgitation.  5. The aortic valve is tricuspid. Aortic valve regurgitation is not visualized. No aortic stenosis is present.  6. The inferior vena cava is dilated in size with <50% respiratory variability, suggesting right atrial pressure of 15 mmHg.  Results/Tests Pending at Time of Discharge: N/A  Discharge Medications:  Allergies as of 05/04/2019   No Known Allergies     Medication List    TAKE these medications   acetaZOLAMIDE 250 MG tablet Commonly known as: DIAMOX Take 250 mg by mouth daily.   albuterol 108 (90 Base) MCG/ACT inhaler Commonly known as: VENTOLIN HFA Inhale 2-4 puffs into the lungs every 4 (four) hours as needed for wheezing (or cough).   multivitamin with minerals Tabs tablet Take 1 tablet by mouth daily.   spironolactone 25 MG tablet Commonly known as: ALDACTONE TAKE 1 TABLET BY MOUTH EVERY DAY   torsemide 20 MG tablet Commonly known as: DEMADEX Take 2 tablets (40 mg total) by mouth daily.   umeclidinium bromide 62.5 MCG/INH Aepb Commonly known as: INCRUSE ELLIPTA Inhale 1 puff into the lungs daily.   Incruse Ellipta 62.5 MCG/INH Aepb Generic drug: umeclidinium bromide Inhale 1 puff into the lungs daily.            Durable Medical Equipment  (From admission, onward)         Start     Ordered   05/04/19 0000   For home use only DME oxygen    Question Answer Comment  Length of Need 12 Months   Mode or (Route) Nasal cannula   Liters per Minute 2   Frequency Continuous (stationary and portable oxygen unit needed)   Oxygen conserving device Yes   Oxygen delivery system Gas      05/04/19 1456          Discharge Instructions: Please refer to Patient Instructions section of EMR for full details.  Patient was counseled important signs and symptoms that should prompt return to medical care, changes in medications, dietary instructions, activity restrictions, and follow up appointments.   Follow-Up Appointments:   Future Appointments  Date Time Provider [place]  05/12/2019  1:30 PM [facility] PA/NP [facility] None  05/13/2019  9:50 AM Shirley, [name], DO [facility] [place]     Autry-Lott, [place], DO 05/06/2019, 6:13 PM PGY-1, [facility]  Resident Attestation   I saw and evaluated the patient,  performing the key elements of the service.I  personally performed or re-performed the history, physical exam, and medical decision making activities of this service and have verified that the service and findings are accurately documented in the resident's note. I developed the management plan that is described in the resident's note, and I agree with the content, with my edits above in red.   Harolyn Rutherford, DO [facility], PGY-3

## 2019-05-01 NOTE — Progress Notes (Signed)
Bilateral lower extremity venous duplex exam performed.  Preliminary results can be found under CV proc under chart review.  05/01/2019 10:52 AM  Pixie Burgener, K., RDMS, RVT

## 2019-05-01 NOTE — Consult Note (Addendum)
Advanced Heart Failure Team Consult Note   Primary Physician: Shirley, [name], DO PCP-Cardiologist:  No primary care provider on file.  Reason for Consultation: Heart Failure   HPI:    Joy [name] is seen today for evaluation of heart failure at the request of Dr Volanda Napoleon.   Joy [name] is a 59 y/o AA female smoker w/ COPD, pulmonary nodule (4 mm LLL by CT in 2015 and 2017), chronic diastolic HF, RV failure, pulmonary HTN, essential HTN, and smoker.   Last admitted back in October 2020 with acute hypoxic respiratory failure secondary to COPD and A/C diastolic heart failure. Diuresed with IV lasix. Had RHC/LHC wiith normal coronaries and moderate PAH. PH suspected to be from chronic lung disease. Recommendations for pulmonary follow up and outpatient sleep study and to continue torsemide 20 mg daily. She was not discharged on diuretics.   Says she has noticed increased leg edema over the last 3-4 weeks. No medical care since her 01/01/19.  Says her medicine is not working. Taking her medications at home. Not lasix or torsemide. Smoking 1PPD. SOB with exertion. + orthopnea. Eats a lot of ice.   Present[facility] to [facility] with increased leg edemaand shortness of breath. SARS2 negative, D Dimer 0.64, HS Trop 102>102>89, and BNP 783. CXR enlarged pulmonary arteries.and COPD. Doppler lower extremity negative for DVT.  Started on 40 mg IV lasix. Negative 3.4 liters so far.   Echo 12/2018-EF 70-75% RV severely enlarged with moderately reduced RV function.   RHC/LHC 12/26/18 Normal coronaries LVEF 60-65% RA 9 PA 73/24 (45) PCWP 14 Fick CO-CI = 3.8/2.1 PVR 8.0   Review of Systems: [y] = yes, _0  = no   . General: Weight gain [Y ]; Weight loss _1 ; Anorexia _2 ; Fatigue [Y ]; Fever _3 ; Chills _4 ; Weakness _5   . Cardiac: Chest pain/pressure _6 ; Resting SOB _7 ; Exertional SOB [ Y]; Orthopnea [Y ]; Pedal Edema [Y ]; Palpitations _8 ; Syncope _9 ; Presyncope _10 ; Paroxysmal nocturnal dyspnea_11     . Pulmonary: Cough _12 ; Wheezing_13 ; Hemoptysis_14 ; Sputum _15 ; Snoring _16   . GI: Vomiting_17 ; Dysphagia_18 ; Melena_19 ; Hematochezia _20 ; Heartburn_21 ; Abdominal pain _22 ; Constipation _23 ; Diarrhea _24 ; BRBPR _25   . GU: Hematuria_26 ; Dysuria _27 ; Nocturia_28   . Vascular: Pain in legs with walking _29 ; Pain in feet with lying flat _30 ; Non-healing sores _31 ; Stroke _32 ; TIA _33 ; Slurred speech _34 ;  . Neuro: Headaches_35 ; Vertigo_36 ; Seizures_37 ; Paresthesias_38 ;Blurred vision _39 ; Diplopia _40 ; Vision changes _41   . Ortho/Skin: Arthritis _42 ; Joint pain [Y ]; Muscle pain _43 ; Joint swelling _44 ; Back Pain _45 ; Rash _46   . Psych: Depression_47 ; Anxiety_48   . Heme: Bleeding problems _49 ; Clotting disorders _50 ; Anemia _51   . Endocrine: Diabetes _52 ; Thyroid dysfunction_53   Home Medications Prior to Admission medications   Medication Sig Start Date End Date Taking? Authorizing Provider  acetaZOLAMIDE (DIAMOX) 250 MG tablet Take 250 mg by mouth daily. 03/31/19  Yes [provider]  albuterol (VENTOLIN HFA) 108 (90 Base) MCG/ACT inhaler Inhale 2-4 puffs into the lungs every 4 (four) hours as needed for wheezing (or cough). 12/30/18  Yes Enid Derry, [name], DO  Multiple Vitamin (MULTIVITAMIN WITH MINERALS) TABS tablet Take 1 tablet by mouth daily.   Yes [provider]  spironolactone (ALDACTONE) 25 MG tablet TAKE 1 TABLET BY MOUTH EVERY DAY Patient taking differently: Take 25 mg by mouth daily.  02/17/19  Yes Enid Derry, [name], DO  umeclidinium bromide (INCRUSE ELLIPTA) 62.5 MCG/INH AEPB Inhale 1 puff into the lungs daily. 12/28/18  Yes Rory Percy, DO  umeclidinium bromide (INCRUSE ELLIPTA) 62.5 MCG/INH AEPB Inhale 1 puff into the lungs daily. Patient not taking: Reported on 04/30/2019 12/30/18   Shirley, [name], DO    Past Medical History: Past Medical History:  Diagnosis Date  . Acute congestive heart failure ([place])   . Acute dyspnea   . Acute on chronic diastolic CHF  (congestive heart failure) ([place]) 08/11/2015  . Acute right heart failure ([place]) 08/11/2015  . Bilateral lower extremity edema   . CHF (congestive heart failure) ([place])   . COPD (chronic obstructive pulmonary disease) ([place]) 08/10/2015   Dx on XRAY 08/08/15.  Needs PFTs   . GASTROESOPHAGEAL REFLUX, NO ESOPHAGITIS 05/16/2006   Qualifier: Diagnosis of  By: Drucie Ip    . GERD (gastroesophageal reflux disease)   . Hypertension   . Hypervolemia   . Leg swelling hospitalized 08/08/2015   BLE  . PELVIC MASS 07/05/2008   Qualifier: Diagnosis of  By: McDiarmid MD, Sherren Mocha    . Pulmonary hypertension ([place])   . Right heart failure ([place]) 08/10/2015  . Tobacco abuse 05/16/2006   Qualifier: Diagnosis of  By: Drucie Ip      Past Surgical History: Past Surgical History:  Procedure Laterality Date  . RIGHT/LEFT HEART CATH AND CORONARY ANGIOGRAPHY N/A 12/26/2018   Procedure: RIGHT/LEFT HEART CATH AND CORONARY ANGIOGRAPHY;  Surgeon: Jolaine Artist, MD;  Location: [facility];  Service: Cardiovascular;  Laterality: N/A;  . TUBAL LIGATION      Family History: No family history of coronary disease.   Social History: Social History   Socioeconomic History  . Marital status: Married    Spouse name: Not on file  . Number of children: Not on file  . Years of education: Not on file  . Highest education level: Not on file  Occupational History  . Not on file  Tobacco Use  . Smoking status: Current Every Day Smoker    Packs/day: 0.15    Years: 36.00    Pack years: 5.40    Types: Cigarettes  . Smokeless tobacco: Former Systems developer    Types: Chew  . Tobacco comment: "quit chewing in my early '20s"  Substance and Sexual Activity  . Alcohol use: Yes    Alcohol/week: 23.0 standard drinks    Types: 12 Cans of beer, 11 Shots of liquor per week  . Drug use: Yes    Types: Marijuana    Comment: 08/08/2015 "I smoke marijuana when I have it; probably a couple times/week"  . Sexual activity: Yes    Other Topics Concern  . Not on file  Social History Narrative  . Not on file   Social Determinants of Health   Financial Resource Strain:   . Difficulty of Paying Living Expenses: Not on file  Food Insecurity:   . Worried About Charity fundraiser in the Last Year: Not on file  . Ran Out of Food in the Last Year: Not on file  Transportation Needs:   . Lack of Transportation (Medical): Not on file  . Lack of Transportation (Non-Medical): Not on file  Physical Activity:   . Days of Exercise per Week: Not on file  . Minutes of Exercise per  Session: Not on file  Stress:   . Feeling of Stress : Not on file  Social Connections:   . Frequency of Communication with Friends and Family: Not on file  . Frequency of Social Gatherings with Friends and Family: Not on file  . Attends Religious Services: Not on file  . Active Member of Clubs or Organizations: Not on file  . Attends Archivist Meetings: Not on file  . Marital Status: Not on file    Allergies:  No Known Allergies  Objective:    Vital Signs:   Temp:  [97.9 F (36.6 C)-98.6 F (37 C)] 98.3 F (36.8 C) (02/12 0755) Pulse Rate:  [63-78] 73 (02/12 0755) Resp:  [15-20] 18 (02/12 0755) BP: (92-141)/(62-94) 113/74 (02/12 0755) SpO2:  [96 %-100 %] 98 % (02/12 0755) Weight:  [84.4 kg-86.5 kg] 84.4 kg (02/12 0500) Last BM Date: 04/30/19  Weight change: Filed Weights   04/30/19 1609 05/01/19 0015 05/01/19 0500  Weight: 86.5 kg 85.4 kg 84.4 kg    Intake/Output:   Intake/Output Summary (Last 24 hours) at 05/01/2019 1145 Last data filed at 05/01/2019 0750 Gross per 24 hour  Intake 720 ml  Output 4100 ml  Net -3380 ml      Physical Exam    General: Sitting in the chair. No resp difficulty HEENT: normal Neck: supple. JVP to jaw . Carotids 2+ bilat; no bruits. No lymphadenopathy or thyromegaly appreciated. Cor: PMI nondisplaced. Regular rate & rhythm. No rubs, gallops or murmurs. Lungs: clear Abdomen:  soft, nontender, distended. No hepatosplenomegaly. No bruits or masses. Good bowel sounds. Extremities: no cyanosis, clubbing, rash, R and and LLE 3+ edema Neuro: alert & orientedx3, cranial nerves grossly intact. moves all 4 extremities w/o difficulty. Affect pleasant   Telemetry   SR 70s   EKG    SR 88 bpm   Labs   Basic Metabolic Panel: Recent Labs  Lab 04/30/19 1022 04/30/19 1056 05/01/19 0429  NA 140 139 143  K 3.8 3.8 4.2  CL 101  --  98  CO2 29  --  38*  GLUCOSE 114*  --  100*  BUN 6  --  7  CREATININE 0.89  --  0.92  CALCIUM 9.0  --  9.0    Liver Function Tests: Recent Labs  Lab 04/30/19 1022 05/01/19 0429  AST 23 24  ALT 16 17  ALKPHOS 54 54  BILITOT 1.1 1.2  PROT 5.7* 5.6*  ALBUMIN 3.3* 3.1*   No results for input(s): LIPASE, AMYLASE in the last 168 hours. No results for input(s): AMMONIA in the last 168 hours.  CBC: Recent Labs  Lab 04/30/19 1022 04/30/19 1056 05/01/19 0429  WBC 4.7  --  5.2  NEUTROABS 2.4  --   --   HGB 15.5* 18.4* 16.5*  HCT 54.4* 54.0* 56.3*  MCV 89.0  --  88.7  PLT 252  --  250    Cardiac Enzymes: No results for input(s): CKTOTAL, CKMB, CKMBINDEX, TROPONINI in the last 168 hours.  BNP: BNP (last 3 results) Recent Labs    12/24/18 0843 04/30/19 1022  BNP 909.6* 783.2*    ProBNP (last 3 results) No results for input(s): PROBNP in the last 8760 hours.   CBG: No results for input(s): GLUCAP in the last 168 hours.  Coagulation Studies: No results for input(s): LABPROT, INR in the last 72 hours.   Imaging   VAS [place] LOWER EXTREMITY VENOUS (DVT)  Result Date: 05/01/2019  Lower Venous DVTStudy  Indications: Edema.  Risk Factors: HTN, COPD. Limitations: Body habitus and poor ultrasound/tissue interface. Comparison Study: No prior exam. Performing Technologist: Baldwin Crown ARDMS, RVT  Examination Guidelines: A complete evaluation includes B-mode imaging, spectral Doppler, color Doppler, and power Doppler as  needed of all accessible portions of each vessel. Bilateral testing is considered an integral part of a complete examination. Limited examinations for reoccurring indications may be performed as noted. The reflux portion of the exam is performed with the patient in reverse Trendelenburg.  +---------+---------------+---------+-----------+----------+--------------+ RIGHT    CompressibilityPhasicitySpontaneityPropertiesThrombus Aging +---------+---------------+---------+-----------+----------+--------------+ CFV      Full           Yes      Yes                                 +---------+---------------+---------+-----------+----------+--------------+ SFJ      Full                                                        +---------+---------------+---------+-----------+----------+--------------+ FV Prox  Full                                                        +---------+---------------+---------+-----------+----------+--------------+ FV Mid   Full                                                        +---------+---------------+---------+-----------+----------+--------------+ FV DistalFull                                                        +---------+---------------+---------+-----------+----------+--------------+ PFV      Full                                                        +---------+---------------+---------+-----------+----------+--------------+ POP      Full           Yes      Yes                                 +---------+---------------+---------+-----------+----------+--------------+ PTV      Full                                                        +---------+---------------+---------+-----------+----------+--------------+ PERO     Full                                                        +---------+---------------+---------+-----------+----------+--------------+    +---------+---------------+---------+-----------+----------+--------------+  LEFT     CompressibilityPhasicitySpontaneityPropertiesThrombus Aging +---------+---------------+---------+-----------+----------+--------------+ CFV      Full           Yes      Yes                                 +---------+---------------+---------+-----------+----------+--------------+ SFJ      Full                                                        +---------+---------------+---------+-----------+----------+--------------+ FV Prox  Full                                                        +---------+---------------+---------+-----------+----------+--------------+ FV Mid   Full                                                        +---------+---------------+---------+-----------+----------+--------------+ FV DistalFull                                                        +---------+---------------+---------+-----------+----------+--------------+ PFV      Full                                                        +---------+---------------+---------+-----------+----------+--------------+ POP      Full           Yes      Yes                                 +---------+---------------+---------+-----------+----------+--------------+ PTV      Full                                                        +---------+---------------+---------+-----------+----------+--------------+ PERO     Full                                                        +---------+---------------+---------+-----------+----------+--------------+ Poorly visualized bilateral calf veins due to severe pitting edema, seen with color flow.    Summary: BILATERAL: - No evidence of deep vein thrombosis seen in the lower extremities, bilaterally.  RIGHT: - No cystic structure found in the popliteal fossa.  LEFT: - No cystic  structure found in the popliteal fossa.  *See table(s) above for measurements  and observations. Electronically signed by Monica Martinez MD on 05/01/2019 at 11:07:40 AM.    Final       Medications:     Current Medications: . enoxaparin (LOVENOX) injection  40 mg Subcutaneous Q24H  . furosemide  40 mg Intravenous Once  . furosemide  80 mg Intravenous BID  . metolazone  2.5 mg Oral Once  . multivitamin with minerals  1 tablet Oral Daily  . umeclidinium bromide  1 puff Inhalation Daily     Infusions:     Assessment/Plan   1. A/C Hypoxic Respiratory Failure O2 sats in the ED 70% on room. Improved saturations to > 90% on 4 liters [place].  -May need home oxygen.   2. A/C Diastolic HF---> R sided failure  ECHO 12/2018 EF 70-75% reduced RV . Repeat ECHO pending.  Marked volume overload. Increase lasix to 80 mg twice a day and give 2.5 mg metolazone now.  - Once diuresed will need to discharge home on torsemide.  - Renal function stable.  - Add unna boots to help with lower extremity edema.   3. Pulmonary HTN  Had RHC 12/2018  RA 9, PA 73/24 (45) , PVR 8, CO 3.8, CI 2.1 . Moderate WHO Group II PAH due to severe hypoxic lung disease. No role for pulmonary vasodilators.  Needs f/u with pulmonary. Needs sleep study. May need oxygen.   4. COPD  Discussed smoking cessation. Needs pulmonary follow up.    Length of Stay: 1  Amy Clegg, NP  05/01/2019, 11:45 AM  Advanced Heart Failure Team Pager [phone] (M-F; [place])  Please contact [facility] for night-coverage after hours (4p -7a ) and weekends on [email or web address]  Patient seen and examined with the above-signed Advanced Practice Provider and/or Housestaff. I personally reviewed laboratory data, imaging studies and relevant notes. I independently examined the patient and formulated the important aspects of the plan. I have edited the note to reflect any of my changes or salient points. I have personally discussed the plan with the patient and/or family.  59 y/o woman with chronic respiratory failure due to  COPD on home O2 and still smoking, PAH, cor pulmonale and diastolic dysfunction.   I did RHC on her in 10/20 and had moderate PAH (mean 45 with PVR 8.0. PCWP 14)    Now admitted with marked volume overload in setting of high fluid intake and ongoing tobacco use.   On exam NAD JVP to ear Cor RRR + RV lift. 2/6 TR Lugs decreased throughout Ab soft NT Ext 2+ edema  She has evidence of cor pulmonale with significant R-sided volume overload. Agree with IV diuresis as above. Stressed need for smoking cessation. Will need PFTs (do prior to d/c) as well as outpatient sleep study. Can consider VQ to exclude CTEPH. Will need paramedicine f/u on d/c.   Glori Bickers, MD  10:06 PM

## 2019-05-01 NOTE — Plan of Care (Signed)

## 2019-05-01 NOTE — Progress Notes (Signed)
Orthopedic Tech Progress Note Patient Details:  Joy [name] 1960-08-11 [id number]  Ortho Devices Type of Ortho Device: Haematologist Ortho Device/Splint Location: Bilateral unna boots Ortho Device/Splint Interventions: Application   Post Interventions Patient Tolerated: Well Instructions Provided: Care of device   [name] 05/01/2019, 3:57 PM

## 2019-05-01 NOTE — Progress Notes (Addendum)
Family Medicine Teaching Service Daily Progress Note Intern Pager: [phone]  Patient name: Joy [name] record number: [id number] Date of birth: 09/25/1960 Age: 59 y.o. Gender: female  Primary Care Provider: Shirley, [name], DO Consultants: None Code Status: DNR  Pt Overview and Major Events to Date:  02/11-admitted  Assessment and Plan: Joy [name] is a 59 y.o. female presenting with progressive SOB and difficulty breathing . PMH is significant for CHF with right heart strain, pulmonary hypertension, COPD, hypertension, tobacco use, THC use  CHF exacerbation  Vital signs stable overnight, soft blood pressures. 24-hour urine output 3.9 L.  Net -3.42 L.  Weight decreased 1.1 kg since admission.  Kidney functions normal.  D-dimer elevated 0.64 given appropriately age adjusted, Wells criteria 0, no tachycardia, no chest pain, no tachypnea PE unlikely so no need for CTA.  Lung exam clear to auscultation.  Bilateral lower extremity edema up to knee. -Continue Lasix 40 mg IV twice daily -Monitor respiratory status -Keep O2 sats between 88-90% -Ambulatory pulse ox prior to discharge -Strict I's and O's -Daily weights -BMP in a.m.  COPD Stable -Continue albuterol as needed  HTN Systolic blood pressure 80D overnight. Home medications include spironolactone. -Continue to hold spironolactone -Monitor blood pressure  PHTN Right/left heart cath 12/2018 shows moderate PAH due to severe hypoxic lung disease with CO2 retention. Observed oxygen saturations in low 70s, patient was asymptomatic no shortness of breath, able to speak in full sentences. -Encourage smoking cessation -Follow-up pulmonology  Elevated A1c HbA1c 6.4 -Follow-up with PCP outpatient  FEN/GI:  -Heart Healthy Prophylaxis:  -Lovenox  Disposition: Home when medically stable  Subjective:  No acute events overnight.  Patient reports breathing is improved.  Denies any shortness of breath or chest pain.   While visiting patient noted oxygen saturations in 70s on room air.  Patient asymptomatic no shortness of breath.  Currently on 2 L nasal cannula with oxygen saturations 95%.  Objective: Temp:  [97.9 F (36.6 C)-98.7 F (37.1 C)] 98.6 F (37 C) (02/12 0353) Pulse Rate:  [63-91] 75 (02/12 0353) Resp:  [15-20] 17 (02/12 0353) BP: (92-141)/(62-100) 92/64 (02/12 0353) SpO2:  [73 %-100 %] 97 % (02/12 0353) Weight:  [85.4 kg-86.5 kg] 85.4 kg (02/12 0015) Physical Exam: General: 59 year old female in no acute distress Cardiovascular: Regular rate and rhythm, no murmurs or gallops appreciated, distal pulses present Respiratory: Chest clear to auscultation bilaterally, no wheezes or crackles noted, upper extremities cool and pale, delayed cap refill. Abdomen: Soft, nontender, nondistended, bowel sounds present Extremities: Bilateral lower extremity edema up to knee with slight improvement.  Laboratory: Recent Labs  Lab 04/30/19 1022 04/30/19 1056 05/01/19 0429  WBC 4.7  --  5.2  HGB 15.5* 18.4* 16.5*  HCT 54.4* 54.0* 56.3*  PLT 252  --  250   Recent Labs  Lab 04/30/19 1022 04/30/19 1056 05/01/19 0429  NA 140 139 143  K 3.8 3.8 4.2  CL 101  --  98  CO2 29  --  38*  BUN 6  --  7  CREATININE 0.89  --  0.92  CALCIUM 9.0  --  9.0  PROT 5.7*  --  5.6*  BILITOT 1.1  --  1.2  ALKPHOS 54  --  54  ALT 16  --  17  AST 23  --  24  GLUCOSE 114*  --  100*   EKG: Vent. rate 75 BPM PR interval 156 ms QRS duration 98 ms QT/QTc 420/469 ms P-R-T axes 77 129 -  64 Normal sinus rhythm Right axis deviation Right ventricular hypertrophy T wave abnormality, consider inferior ischemia T wave abnormality, consider anterolateral ischemia Prolonged QT Abnormal ECG   Imaging/Diagnostic Tests: DG Chest Port 1 View  Result Date: 04/30/2019 CLINICAL DATA:  Hypoxia.  Short of breath for a week. EXAM: PORTABLE CHEST 1 VIEW COMPARISON:  12/24/2018 FINDINGS: Mild enlargement of the  cardiopericardial silhouette. No mediastinal or hilar masses. Enlarged pulmonary arteries. Lungs are hyperexpanded with mild prominence of the bronchovascular markings. Lungs otherwise clear. No convincing pleural effusion and no pneumothorax. Skeletal structures are grossly intact. IMPRESSION: 1. No acute cardiopulmonary disease. 2. Mild cardiomegaly. Enlarged pulmonary arteries suggest pulmonary hypertension. Lung hyperexpansion suggesting COPD. Electronically Signed   By: Lajean Manes M.D.   On: 04/30/2019 11:02   Carollee Leitz, MD 05/01/2019, 6:03 AM PGY-1, [place] Intern pager: [phone], text pages welcome

## 2019-05-01 NOTE — Evaluation (Signed)
Occupational Therapy Evaluation and Discharge Patient Details Name: Joy [name] MRN: [id number] DOB: 1960-06-15 Today's Date: 05/01/2019    History of Present Illness 59 y.o. female presenting with progressive SOB and difficulty breathing . PMH is significant for CHF with right heart strain, pulmonary hypertension, COPD, hypertension, tobacco use, THC use   Clinical Impression   Pt is functioning independently. Educated in energy conservation strategies, verbalized understanding. No further OT needs.    Follow Up Recommendations  No OT follow up    Equipment Recommendations  None recommended by OT    Recommendations for Other Services       Precautions / Restrictions Precautions Precautions: None Restrictions Weight Bearing Restrictions: No      Mobility Bed Mobility Overal bed mobility: Independent                Transfers Overall transfer level: Independent Equipment used: None                  Balance Overall balance assessment: Independent                                         ADL either performed or assessed with clinical judgement   ADL Overall ADL's : Independent                                       General ADL Comments: educated in energy conservation strategies     Vision Patient Visual Report: No change from baseline       Perception     Praxis      Pertinent Vitals/Pain Pain Assessment: No/denies pain     Hand Dominance Right   Extremity/Trunk Assessment Upper Extremity Assessment Upper Extremity Assessment: Overall WFL for tasks assessed   Lower Extremity Assessment Lower Extremity Assessment: (edematous)   Cervical / Trunk Assessment Cervical / Trunk Assessment: Normal   Communication Communication Communication: No difficulties   Cognition Arousal/Alertness: Awake/alert Behavior During Therapy: Flat affect Overall Cognitive Status: Within Functional Limits for tasks  assessed                                     General Comments       Exercises     Shoulder Instructions      Home Living Family/patient expects to be discharged to:: Private residence Living Arrangements: Spouse/significant other Available Help at Discharge: Family;Available 24 hours/day Type of Home: House Home Access: Stairs to enter [employer] of Steps: 4 Entrance Stairs-Rails: Can reach both Home Layout: One level     Bathroom Shower/Tub: Teacher, early years/pre: Standard     Home Equipment: None          Prior Functioning/Environment Level of Independence: Independent        Comments: works in Ambulance person at [employer]        OT Problem List:        OT Treatment/Interventions:      OT Goals(Current goals can be found in the care plan section) Acute Rehab OT Goals Patient Stated Goal: to go back to work  OT Frequency:     Barriers to D/C:            Co-evaluation  AM-PAC OT "6 Clicks" Daily Activity     Outcome Measure Help from another person eating meals?: None Help from another person taking care of personal grooming?: None Help from another person toileting, which includes using toliet, bedpan, or urinal?: None Help from another person bathing (including washing, rinsing, drying)?: None Help from another person to put on and taking off regular upper body clothing?: None Help from another person to put on and taking off regular lower body clothing?: None 6 Click Score: 24   End of Session Equipment Utilized During Treatment: Oxygen(2L)  Activity Tolerance: Patient tolerated treatment well Patient left: in chair;with call bell/phone within reach  OT Visit Diagnosis: Other (comment)(decreased activity tolerance)                Time: 4599-7741 OT Time Calculation (min): 14 min Charges:  OT General Charges $OT Visit: 1 Visit OT Evaluation $OT Eval Low Complexity: 1 Low  Nestor Lewandowsky,  OTR/L Acute Rehabilitation Services Pager: [phone] Office: [phone]  Malka So 05/01/2019, 12:06 PM

## 2019-05-02 ENCOUNTER — Inpatient Hospital Stay (HOSPITAL_COMMUNITY): Payer: Self-pay

## 2019-05-02 DIAGNOSIS — I50810 Right heart failure, unspecified: Secondary | ICD-10-CM

## 2019-05-02 DIAGNOSIS — I2729 Other secondary pulmonary hypertension: Secondary | ICD-10-CM

## 2019-05-02 DIAGNOSIS — E8770 Fluid overload, unspecified: Secondary | ICD-10-CM

## 2019-05-02 LAB — BASIC METABOLIC PANEL
Anion gap: 12 (ref 5–15)
Anion gap: 11 (ref 5–15)
BUN: 6 mg/dL (ref 6–20)
BUN: 5 mg/dL — ABNORMAL LOW (ref 6–20)
CO2: 40 mmol/L — ABNORMAL HIGH (ref 22–32)
CO2: 46 mmol/L — ABNORMAL HIGH (ref 22–32)
Calcium: 8.9 mg/dL (ref 8.9–10.3)
Calcium: 9.4 mg/dL (ref 8.9–10.3)
Chloride: 87 mmol/L — ABNORMAL LOW (ref 98–111)
Chloride: 81 mmol/L — ABNORMAL LOW (ref 98–111)
Creatinine, Ser: 0.8 mg/dL (ref 0.44–1.00)
Creatinine, Ser: 0.75 mg/dL (ref 0.44–1.00)
GFR calc Af Amer: >60 mL/min (ref >60)
GFR calc Af Amer: >60 mL/min (ref >60)
GFR calc non Af Amer: >60 mL/min (ref >60)
GFR calc non Af Amer: >60 mL/min (ref >60)
Glucose, Bld: 91 mg/dL (ref 70–99)
Glucose, Bld: 99 mg/dL (ref 70–99)
Potassium: 3.7 mmol/L (ref 3.5–5.1)
Potassium: 3.8 mmol/L (ref 3.5–5.1)
Sodium: 139 mmol/L (ref 135–145)
Sodium: 138 mmol/L (ref 135–145)

## 2019-05-02 LAB — CBC
HCT: 56.8 % — ABNORMAL HIGH (ref 36.0–46.0)
Hemoglobin: 16.3 g/dL — ABNORMAL HIGH (ref 12.0–15.0)
MCH: 25.3 pg — ABNORMAL LOW (ref 26.0–34.0)
MCHC: 28.7 g/dL — ABNORMAL LOW (ref 30.0–36.0)
MCV: 88.3 fL (ref 80.0–100.0)
Platelets: 227 10*3/uL (ref 150–400)
RBC: 6.43 MIL/uL — ABNORMAL HIGH (ref 3.87–5.11)
RDW: 17 % — ABNORMAL HIGH (ref 11.5–15.5)
WBC: 5.7 10*3/uL (ref 4.0–10.5)
nRBC: 0 % (ref 0.0–0.2)

## 2019-05-02 LAB — RETICULOCYTES
Immature Retic Fract: 8.6 % (ref 2.3–15.9)
RBC.: 6.35 MIL/uL — ABNORMAL HIGH (ref 3.87–5.11)
Retic Count, Absolute: 52 10*3/uL (ref 19.0–186.0)
Retic Ct Pct: 0.8 % (ref 0.4–3.1)

## 2019-05-02 LAB — FERRITIN: Ferritin: 13 ng/mL (ref 11–307)

## 2019-05-02 LAB — VITAMIN B12: Vitamin B-12: 461 pg/mL (ref 180–914)

## 2019-05-02 LAB — MAGNESIUM: Magnesium: 1.9 mg/dL (ref 1.7–2.4)

## 2019-05-02 MED ORDER — MAGNESIUM SULFATE 2 GM/50ML IV SOLN
2.0000 g | Freq: Once | INTRAVENOUS | Status: AC
  Administered 2019-05-02: 2 g via INTRAVENOUS
  Filled 2019-05-02: qty 50

## 2019-05-02 MED ORDER — POTASSIUM CHLORIDE CRYS ER 20 MEQ PO TBCR
40.0000 meq | EXTENDED_RELEASE_TABLET | Freq: Once | ORAL | Status: AC
  Administered 2019-05-02: 10:00:00 40 meq via ORAL
  Filled 2019-05-02: qty 2

## 2019-05-02 NOTE — Progress Notes (Signed)
Family Medicine Teaching Service Daily Progress Note Intern Pager: [phone]  Patient name: Joy [name]: [id number] Date of birth: [birth year] Age: 59 y.o. Gender: female  Primary Care Provider: Shirley, [name], DO Consultants: None Code Status: DNR  Pt Overview and Major Events to Date:  02/11-admitted  Assessment and Plan: Joy [name] is a 59 y.o. female presenting with progressive SOB and difficulty breathing . PMH is significant for CHF with right heart strain, pulmonary hypertension, COPD, hypertension, tobacco use, THC use  PHTN with right sided heart failure- lungs clear without rales on 1L O2, almost 6L UOP yesterday, down ~3.5kg from yesterday. Her volume status is moving in the right direction. Looking to cardiology for the disposition end point. Labs pending - f/u cards recs  - continue to diurese (77m lasix IV BID)  - oxygen PRN - O2 on ambulation trial - strict I/Os, daily weights - BMP am - continue unna boots -Encourage smoking cessation -Will need outpatient follow-up with pulmonology -Consider outpatient sleep studies  COPD- with hypoxia.  - PFT outpatient - incruse daily -Continue albuterol as needed - wean O2 as able - consider pulmonology referral  HTN- Normotensive overnight -Continue to hold spironolactone -Monitor blood pressure  FEN/GI:  -Heart Healthy Prophylaxis:  -Lovenox  Disposition: Home when medically stable  Subjective:  Patient feeling well overall. No complaints with breathing. She thinks her leg swelling is improving. Questions about disability paperwork.   Objective: Temp:  [97.9 F (36.6 C)-98.6 F (37 C)] 97.9 F (36.6 C) (02/13 2057) Pulse Rate:  [61-80] 80 (02/13 2057) Resp:  [16-18] 16 (02/13 2057) BP: (97-105)/(61-69) 97/63 (02/13 2057) SpO2:  [90 %-100 %] 90 % (02/13 2057) Weight:  [80 kg] 80 kg (02/13 0434) Physical Exam: General: alert and sitting at side of bed Cardiovascular: Regular  rate and rhythm, no murmurs or gallops appreciated Respiratory: Chest clear to auscultation bilaterally,no wheezing or rales  Abdomen: Soft, nontender, nondistended, bowel sounds present Extremities: Bilateral lower extremity edema up to knee improving, Unna boots in place. Tender to palpation  Laboratory: Recent Labs  Lab 04/30/19 1022 04/30/19 1022 04/30/19 1056 05/01/19 0429 05/02/19 0546  WBC 4.7  --   --  5.2 5.7  HGB 15.5*   < > 18.4* 16.5* 16.3*  HCT 54.4*   < > 54.0* 56.3* 56.8*  PLT 252  --   --  250 227   < > = values in this interval not displayed.   Recent Labs  Lab 04/30/19 1022 04/30/19 1056 05/01/19 0429 05/01/19 0429 05/01/19 1454 05/02/19 0546 05/02/19 1640  NA 140   < > 143   < > 143 139 138  K 3.8   < > 4.2   < > 3.5 3.7 3.8  CL 101   < > 98   < > 92* 87* 81*  CO2 29   < > 38*   < > 43* 40* 46*  BUN 6   < > 7   < > 5* 6 5*  CREATININE 0.89   < > 0.92   < > 0.95 0.80 0.75  CALCIUM 9.0   < > 9.0   < > 9.0 8.9 9.4  PROT 5.7*  --  5.6*  --   --   --   --   BILITOT 1.1  --  1.2  --   --   --   --   ALKPHOS 54  --  54  --   --   --   --  ALT 16  --  17  --   --   --   --   AST 23  --  24  --   --   --   --   GLUCOSE 114*   < > 100*   < > 66* 91 99   < > = values in this interval not displayed.    Imaging/Diagnostic Tests: ECHOCARDIOGRAM COMPLETE  Result Date: 05/02/2019    ECHOCARDIOGRAM REPORT   Patient Name:   Joy [name] Date of Exam: 05/02/2019 Medical Rec #:  [id number]     Height:       66.5 in Accession #:    [id number]    Weight:       176.4 lb Date of Birth:  [birth year]     BSA:          1.91 m Patient Age:    59 years      BP:           105/69 mmHg Patient Gender: F             HR:           69 bpm. Exam Location:  Inpatient Procedure: 2D Echo, Color Doppler and Cardiac Doppler                            MODIFIED REPORT: This report was modified by Eleonore Chiquito MD on 05/02/2019 due to error.  Indications:     I50.9* Heart failure (unspecified)   History:         Patient has prior history of Echocardiogram examinations, most                  recent 12/25/2018. CHF, Pulmonary HTN and COPD; Risk                  Factors:Hypertension.  Sonographer:     Raquel Sarna Senior RDCS Referring Phys:  [id number] [place] Diagnosing Phys: Eleonore Chiquito MD IMPRESSIONS  1. Left ventricular ejection fraction, by estimation, is 60 to 65%. The left ventricle has normal function. The left ventricle demonstrates regional wall motion abnormalities (see scoring diagram/findings for description). Left ventricular diastolic function could not be evaluated. There is the interventricular septum is flattened in systole and diastole, consistent with right ventricular pressure and volume overload.  2. Right ventricular systolic function is moderately reduced. The right ventricular size is severely enlarged. There is mildly elevated pulmonary artery systolic pressure. The estimated right ventricular systolic pressure is 75.9 mmHg.  3. Right atrial size was severely dilated.  4. The mitral valve is grossly normal. Trivial mitral valve regurgitation.  5. The aortic valve is tricuspid. Aortic valve regurgitation is not visualized. No aortic stenosis is present.  6. The inferior vena cava is dilated in size with <50% respiratory variability, suggesting right atrial pressure of 15 mmHg. Comparison(s): A prior study was performed on 12/25/2018. No significant change from prior study. Conclusion(s)/Recomendation(s): Findings consistent with Cor Pulmonale. FINDINGS  Left Ventricle: Left ventricular ejection fraction, by estimation, is 60 to 65%. The left ventricle has normal function. The left ventricle demonstrates regional wall motion abnormalities. The left ventricular internal cavity size was normal in size. There is no left ventricular hypertrophy. The interventricular septum is flattened in systole and diastole, consistent with right ventricular pressure and volume overload. The left  ventricular diastology could not be evaluated due to abnormal septal motion. Left ventricular diastolic function could  not be evaluated. Right Ventricle: The right ventricular size is severely enlarged. No increase in right ventricular wall thickness. Right ventricular systolic function is moderately reduced. There is mildly elevated pulmonary artery systolic pressure. The tricuspid regurgitant velocity is 2.78 m/s, and with an assumed right atrial pressure of 15 mmHg, the estimated right ventricular systolic pressure is 76.7 mmHg. Left Atrium: Left atrial size was normal in size. Right Atrium: Right atrial size was severely dilated. Prominent Eustachian valve. Pericardium: There is no evidence of pericardial effusion. Mitral Valve: The mitral valve is grossly normal. Trivial mitral valve regurgitation. Tricuspid Valve: The tricuspid valve is grossly normal. Tricuspid valve regurgitation is mild . No evidence of tricuspid stenosis. Aortic Valve: The aortic valve is tricuspid. Aortic valve regurgitation is not visualized. No aortic stenosis is present. Pulmonic Valve: The pulmonic valve was grossly normal. Pulmonic valve regurgitation is not visualized. Aorta: The aortic root and ascending aorta are structurally normal, with no evidence of dilitation. Venous: The inferior vena cava is dilated in size with less than 50% respiratory variability, suggesting right atrial pressure of 15 mmHg. IAS/Shunts: There is left bowing of the interatrial septum, suggestive of elevated right atrial pressure. No atrial level shunt detected by color flow Doppler.  LEFT VENTRICLE PLAX 2D LVIDd:         4.50 cm  Diastology LVIDs:         2.30 cm  LV e' lateral:   7.62 cm/s LV PW:         1.10 cm  LV E/e' lateral: 11.1 LV IVS:        0.90 cm  LV e' medial:    5.11 cm/s LVOT diam:     1.90 cm  LV E/e' medial:  16.5 LV SV:         59.54 ml LV SV Index:   37.98 LVOT Area:     2.84 cm  RIGHT VENTRICLE RV S prime:     12.70 cm/s TAPSE  (M-mode): 1.9 cm LEFT ATRIUM             Index       RIGHT ATRIUM           Index LA diam:        3.30 cm 1.73 cm/m  RA Area:     24.40 cm LA Vol (A2C):   45.3 ml 23.76 ml/m RA Volume:   79.90 ml  41.91 ml/m LA Vol (A4C):   37.3 ml 19.56 ml/m LA Biplane Vol: 41.4 ml 21.71 ml/m  AORTIC VALVE LVOT Vmax:   94.60 cm/s LVOT Vmean:  67.400 cm/s LVOT VTI:    0.210 m  AORTA Ao Root diam: 2.90 cm Ao Asc diam:  3.00 cm MITRAL VALVE               TRICUSPID VALVE MV Area (PHT): 2.99 cm    TR Peak grad:   30.9 mmHg MV Decel Time: 254 msec    TR Vmax:        278.00 cm/s MV E velocity: 84.40 cm/s MV A velocity: 70.30 cm/s  SHUNTS MV E/A ratio:  1.20        Systemic VTI:  0.21 m                            Systemic Diam: 1.90 cm Eleonore Chiquito MD Electronically signed by Eleonore Chiquito MD Signature Date/Time: 05/02/2019/2:34:40 PM    Final (Updated)  Richarda Osmond, DO 05/02/2019, 11:12 PM PGY-2, [place] Intern pager: [phone], text pages welcome

## 2019-05-02 NOTE — Progress Notes (Signed)
Cardiology Progress Note  Patient ID: Joy [name] MRN: [id number] DOB: [birth year] Date of Encounter: 05/02/2019  Primary Cardiologist: No primary care provider on file.  Subjective  5.8 L urine output.  Net -4.9 L.  Reports she is breathing much better.  Lower extremity edema still remains but much improved.  ROS:  All other ROS reviewed and negative. Pertinent positives noted in the HPI.     Inpatient Medications  Scheduled Meds: . enoxaparin (LOVENOX) injection  40 mg Subcutaneous Q24H  . furosemide  80 mg Intravenous BID  . multivitamin with minerals  1 tablet Oral Daily  . umeclidinium bromide  1 puff Inhalation Daily   Continuous Infusions:  PRN Meds: acetaminophen **OR** acetaminophen, albuterol   Vital Signs   Vitals:   05/01/19 2022 05/02/19 0434 05/02/19 0438 05/02/19 1150  BP: 114/78  105/61 105/69  Pulse: 70  61 63  Resp: _0 Temp: 98.3 F (36.8 C)  98.5 F (36.9 C) 98.6 F (37 C)  TempSrc: Oral  Oral Oral  SpO2: 99%  100% 96%  Weight:  80 kg    Height:        Intake/Output Summary (Last 24 hours) at 05/02/2019 1255 Last data filed at 05/02/2019 1035 Gross per 24 hour  Intake 420 ml  Output 6550 ml  Net -6130 ml   Last 3 Weights 05/02/2019 05/01/2019 05/01/2019  Weight (lbs) 176 lb 6.4 oz 186 lb 1.1 oz 188 lb 4.4 oz  Weight (kg) 80.015 kg 84.4 kg 85.4 kg      Telemetry  Overnight telemetry shows normal sinus rhythm heart rate in 60s, which I personally reviewed.   ECG  The most recent ECG shows normal sinus rhythm, heart rate 88, biatrial enlargement, RVH noted, which I personally reviewed.   Physical Exam   Vitals:   05/01/19 2022 05/02/19 0434 05/02/19 0438 05/02/19 1150  BP: 114/78  105/61 105/69  Pulse: 70  61 63  Resp: _1 Temp: 98.3 F (36.8 C)  98.5 F (36.9 C) 98.6 F (37 C)  TempSrc: Oral  Oral Oral  SpO2: 99%  100% 96%  Weight:  80 kg    Height:         Intake/Output Summary (Last 24 hours) at 05/02/2019  1255 Last data filed at 05/02/2019 1035 Gross per 24 hour  Intake 420 ml  Output 6550 ml  Net -6130 ml    Last 3 Weights 05/02/2019 05/01/2019 05/01/2019  Weight (lbs) 176 lb 6.4 oz 186 lb 1.1 oz 188 lb 4.4 oz  Weight (kg) 80.015 kg 84.4 kg 85.4 kg    Body mass index is 28.05 kg/m.  General: Well nourished, well developed, in no acute distress Head: Atraumatic, normal size  Eyes: PEERLA, EOMI  Neck: Supple, JVD noted around 15 cm of water Endocrine: No thryomegaly Cardiac: Normal S1, S2; RRR; no murmurs, rubs, or gallops Lungs: Diminished breath sounds bilaterally Abd: Soft, nontender, no hepatomegaly  Ext: 2+ pitting edema up to knees Musculoskeletal: No deformities, BUE and BLE strength normal and equal Skin: Warm and dry, no rashes   Neuro: Alert and oriented to person, place, time, and situation, CNII-XII grossly intact, no focal deficits  Psych: Normal mood and affect   Labs  High Sensitivity Troponin:   Recent Labs  Lab 04/30/19 1022 04/30/19 1300 04/30/19 1500  TROPONINIHS 102* 102* 89*     Cardiac EnzymesNo results for input(s): TROPONINI in the last 168 hours. No results  for input(s): TROPIPOC in the last 168 hours.  Chemistry Recent Labs  Lab 04/30/19 1022 04/30/19 1056 05/01/19 0429 05/01/19 1454 05/02/19 0546  NA 140   < > 143 143 139  K 3.8   < > 4.2 3.5 3.7  CL 101   < > 98 92* 87*  CO2 29   < > 38* 43* 40*  GLUCOSE 114*   < > 100* 66* 91  BUN 6   < > 7 5* 6  CREATININE 0.89   < > 0.92 0.95 0.80  CALCIUM 9.0   < > 9.0 9.0 8.9  PROT 5.7*  --  5.6*  --   --   ALBUMIN 3.3*  --  3.1*  --   --   AST 23  --  24  --   --   ALT 16  --  17  --   --   ALKPHOS 54  --  54  --   --   BILITOT 1.1  --  1.2  --   --   GFRNONAA >60   < > >60 >60 >60  GFRAA >60   < > >60 >60 >60  ANIONGAP 10   < > _0 < > = values in this interval not displayed.    Hematology Recent Labs  Lab 04/30/19 1022 04/30/19 1022 04/30/19 1056 05/01/19 0429 05/02/19 0546  WBC  4.7  --   --  5.2 5.7  RBC 6.11*   < >  --  6.35* 6.43*  6.35*  HGB 15.5*   < > 18.4* 16.5* 16.3*  HCT 54.4*   < > 54.0* 56.3* 56.8*  MCV 89.0  --   --  88.7 88.3  MCH 25.4*  --   --  26.0 25.3*  MCHC 28.5*  --   --  29.3* 28.7*  RDW 16.4*  --   --  17.0* 17.0*  PLT 252  --   --  250 227   < > = values in this interval not displayed.   BNP Recent Labs  Lab 04/30/19 1022  BNP 783.2*    DDimer  Recent Labs  Lab 04/30/19 1902  DDIMER 0.64*     Radiology  VAS [place] LOWER EXTREMITY VENOUS (DVT)  Result Date: 05/01/2019  Lower Venous DVTStudy Indications: Edema.  Risk Factors: HTN, COPD. Limitations: Body habitus and poor ultrasound/tissue interface. Comparison Study: No prior exam. Performing Technologist: Baldwin Crown ARDMS, RVT  Examination Guidelines: A complete evaluation includes B-mode imaging, spectral Doppler, color Doppler, and power Doppler as needed of all accessible portions of each vessel. Bilateral testing is considered an integral part of a complete examination. Limited examinations for reoccurring indications may be performed as noted. The reflux portion of the exam is performed with the patient in reverse Trendelenburg.  +---------+---------------+---------+-----------+----------+--------------+ RIGHT    CompressibilityPhasicitySpontaneityPropertiesThrombus Aging +---------+---------------+---------+-----------+----------+--------------+ CFV      Full           Yes      Yes                                 +---------+---------------+---------+-----------+----------+--------------+ SFJ      Full                                                        +---------+---------------+---------+-----------+----------+--------------+  FV Prox  Full                                                        +---------+---------------+---------+-----------+----------+--------------+ FV Mid   Full                                                         +---------+---------------+---------+-----------+----------+--------------+ FV DistalFull                                                        +---------+---------------+---------+-----------+----------+--------------+ PFV      Full                                                        +---------+---------------+---------+-----------+----------+--------------+ POP      Full           Yes      Yes                                 +---------+---------------+---------+-----------+----------+--------------+ PTV      Full                                                        +---------+---------------+---------+-----------+----------+--------------+ PERO     Full                                                        +---------+---------------+---------+-----------+----------+--------------+   +---------+---------------+---------+-----------+----------+--------------+ LEFT     CompressibilityPhasicitySpontaneityPropertiesThrombus Aging +---------+---------------+---------+-----------+----------+--------------+ CFV      Full           Yes      Yes                                 +---------+---------------+---------+-----------+----------+--------------+ SFJ      Full                                                        +---------+---------------+---------+-----------+----------+--------------+ FV Prox  Full                                                        +---------+---------------+---------+-----------+----------+--------------+  FV Mid   Full                                                        +---------+---------------+---------+-----------+----------+--------------+ FV DistalFull                                                        +---------+---------------+---------+-----------+----------+--------------+ PFV      Full                                                         +---------+---------------+---------+-----------+----------+--------------+ POP      Full           Yes      Yes                                 +---------+---------------+---------+-----------+----------+--------------+ PTV      Full                                                        +---------+---------------+---------+-----------+----------+--------------+ PERO     Full                                                        +---------+---------------+---------+-----------+----------+--------------+ Poorly visualized bilateral calf veins due to severe pitting edema, seen with color flow.    Summary: BILATERAL: - No evidence of deep vein thrombosis seen in the lower extremities, bilaterally.  RIGHT: - No cystic structure found in the popliteal fossa.  LEFT: - No cystic structure found in the popliteal fossa.  *See table(s) above for measurements and observations. Electronically signed by Monica Martinez MD on 05/01/2019 at 11:07:40 AM.    Final     Cardiac Studies  TTE 12/25/2018 1. Left ventricular ejection fraction, by visual estimation, is 70 to  75%. The left ventricle has normal function. Normal left ventricular size.  There is no left ventricular hypertrophy.  2. Elevated left ventricular end-diastolic pressure.  3. Left ventricular diastolic Doppler parameters are consistent with  pseudonormalization pattern of LV diastolic filling.  4. Global right ventricle has moderately reduced systolic function.The  right ventricular size is severely enlarged. No increase in right  ventricular wall thickness.  5. Right ventricular volume and pressure overload and right ventricular  pressure overload.  6. Left atrial size was normal.  7. Right atrial size was severely dilated.  8. The mitral valve is normal in structure. Trace mitral valve  regurgitation. No evidence of mitral stenosis.  9. The tricuspid valve is normal in structure. Tricuspid valve  regurgitation is  trivial.  10. The aortic valve is  tricuspid Aortic valve regurgitation was not  visualized by color flow Doppler. Mild aortic valve sclerosis without  stenosis.  11. The pulmonic valve was normal in structure. Pulmonic valve  regurgitation is not visualized by color flow Doppler.  12. Normal pulmonary artery systolic pressure.  13. The inferior vena cava is dilated in size with >50% respiratory  variability, suggesting right atrial pressure of 8 mmHg.  LHC/RHC 12/26/2018 Ao = 105/66 (85) LV =  106/12 RA =  9 RV = 75/10 PA = 73/24 (45) PCW = 14 Fick cardiac output/index = 3.8/2.1 PVR = 8.0 WU Ao sat = 83% (RA - no sedation) PA sat = 54%, 56%  Arterial sats 78-83% on ABG (sats read 85-87% on pulse ox at the time)  ABG in cath lab (RA): 7.45/71/49/83% - no sedation  Assessment: 1. Normal coronaries 2. LVEF 60-65% 3. Moderate PAH due to severe hypoxic lung disease with CO2 retention  Patient Profile  Joy [name] is a 59 y.o. female with with history of COPD, chronic diastolic heart failure, cor pulmonale secondary to pulmonary hypertension from lung disease who was admitted for acute hypoxic respiratory failure on 05/01/2019.  Cardiology asked to evaluate due to decompensated right-sided heart failure.  Assessment & Plan   1.  Acute on chronic hypoxic respiratory failure -Hypoxic on admission.  Chest x-ray with likely COPD.  No gross volume overload on my examination. -She has known COPD and is the etiology for pulmonary hypertension. -We will continue nasal cannula O2 therapy and wean as able.  Likely will need this going home. -Arterial blood gas with significant hypercarbia up to 60.  Further suggestive of COPD.  2.  Pulmonary hypertension with right-sided heart failure/cor pulmonale -Right heart catheterization consistent with group 3 PAH secondary to chronic hypoxic lung disease.  She had a wedge pressure of 14 at that catheterization time. -She remains grossly volume  overloaded but has good urine output. -Did receive metolazone yesterday. -We will discontinue 80 mg IV Lasix twice daily today.  Anticipate she will be here several more days for diuresis -Mainstay of treatment for her right heart failure is avoiding hypoxia.  I suspect she will need oxygen going home.  We will have to assess this need. Would consider a pulmonary consult for COPD management. -would obtain PFTs prior to discharge -consider NM V/Q scan as outpatient  -Outpatient sleep study.   For questions or updates, please contact [place] Please consult [email or web address] for contact info under   Signed, [place] T. Audie Box, [place]  05/02/2019 12:55 PM

## 2019-05-02 NOTE — Progress Notes (Signed)
Family Medicine Teaching Service Daily Progress Note Intern Pager: [phone]  Patient name: Joy [name] record number: [id number] Date of birth: Nov 15, 1960 Age: 59 y.o. Gender: female  Primary Care Provider: Shirley, [name], DO Consultants: None Code Status: DNR  Pt Overview and Major Events to Date:  02/11-admitted  Assessment and Plan: LISETTE [name] is a 59 y.o. female presenting with progressive SOB and difficulty breathing . PMH is significant for CHF with right heart strain, pulmonary hypertension, COPD, hypertension, tobacco use, THC use  CHF exacerbation  Stable overnight.  Oxygen saturations 99% on 2 L nasal cannula..  5.8 L yesterday.  Net -4.918 L.  Weight decreased 5.4 kg since admission.  Received 1 dose of metolazone 2.5 mg yesterday.  Denies any worsening shortness of breath.  Lung sounds clear.  Bilateral lower extremity plus plus edema but slightly improving. -Heart failure team following, appreciate recommendations -IV Lasix increased to 80 mg twice daily -Replete electrolytes as needed -Continue Unna boots -Labs pending -Maintain oxygen saturations between 88-92% -Strict I's and O's -Daily weights -BMP 5 PM today -BMP in a.m. -Ambulatory pulse ox prior to discharge to assess need for home O2   COPD Stable -Continue albuterol as needed  HTN Normotensive overnight -Continue to hold spironolactone -Monitor blood pressure  PHTN Right/left heart cath 12/2018 shows moderate PAH due to severe hypoxic lung disease with CO2 retention. Observed oxygen saturations in low 70s, patient was asymptomatic no shortness of breath, able to speak in full sentences. -Encourage smoking cessation -Will need outpatient follow-up with pulmonology -Consider outpatient sleep studies -Ambulate with pulse ox prior to discharge  FEN/GI:  -Heart Healthy Prophylaxis:  -Lovenox  Disposition: Home when medically stable  Subjective:  No acute events overnight.   Patient reports breathing is good.  Denies any increasing shortness of breath.  Reports leg swelling starting to decrease.  Appetite good and voiding well.  Objective: Temp:  [97.7 F (36.5 C)-98.5 F (36.9 C)] 98.5 F (36.9 C) (02/13 0438) Pulse Rate:  [61-76] 61 (02/13 0438) Resp:  [14-18] 16 (02/13 0438) BP: (105-129)/(61-91) 105/61 (02/13 0438) SpO2:  [98 %-100 %] 100 % (02/13 0438) Weight:  [80 kg] 80 kg (02/13 0434) Physical Exam: General: 59 year old female in no acute distress Cardiovascular: Regular rate and rhythm, no murmurs or gallops appreciated Respiratory: Chest clear to auscultation bilaterally abdomen no wheezing or crackles noted good cap refill Abdomen: Soft, nontender, nondistended, bowel sounds present Extremities: Bilateral lower extremity edema up to knee improving, Unna boots in place   Laboratory: Recent Labs  Lab 04/30/19 1022 04/30/19 1056 05/01/19 0429  WBC 4.7  --  5.2  HGB 15.5* 18.4* 16.5*  HCT 54.4* 54.0* 56.3*  PLT 252  --  250   Recent Labs  Lab 04/30/19 1022 04/30/19 1022 04/30/19 1056 05/01/19 0429 05/01/19 1454  NA 140   < > 139 143 143  K 3.8   < > 3.8 4.2 3.5  CL 101  --   --  98 92*  CO2 29  --   --  38* 43*  BUN 6  --   --  7 5*  CREATININE 0.89  --   --  0.92 0.95  CALCIUM 9.0  --   --  9.0 9.0  PROT 5.7*  --   --  5.6*  --   BILITOT 1.1  --   --  1.2  --   ALKPHOS 54  --   --  54  --   ALT  16  --   --  17  --   AST 23  --   --  24  --   GLUCOSE 114*  --   --  100* 66*   < > = values in this interval not displayed.   EKG: Vent. rate 75 BPM PR interval 156 ms QRS duration 98 ms QT/QTc 420/469 ms P-R-T axes 77 129 -64 Normal sinus rhythm Right axis deviation Right ventricular hypertrophy T wave abnormality, consider inferior ischemia T wave abnormality, consider anterolateral ischemia Prolonged QT Abnormal ECG   Imaging/Diagnostic Tests: VAS [place] LOWER EXTREMITY VENOUS (DVT)  Result Date: 05/01/2019  Lower  Venous DVTStudy Indications: Edema.  Risk Factors: HTN, COPD. Limitations: Body habitus and poor ultrasound/tissue interface. Comparison Study: No prior exam. Performing Technologist: Baldwin Crown ARDMS, RVT  Examination Guidelines: A complete evaluation includes B-mode imaging, spectral Doppler, color Doppler, and power Doppler as needed of all accessible portions of each vessel. Bilateral testing is considered an integral part of a complete examination. Limited examinations for reoccurring indications may be performed as noted. The reflux portion of the exam is performed with the patient in reverse Trendelenburg.  +---------+---------------+---------+-----------+----------+--------------+ RIGHT    CompressibilityPhasicitySpontaneityPropertiesThrombus Aging +---------+---------------+---------+-----------+----------+--------------+ CFV      Full           Yes      Yes                                 +---------+---------------+---------+-----------+----------+--------------+ SFJ      Full                                                        +---------+---------------+---------+-----------+----------+--------------+ FV Prox  Full                                                        +---------+---------------+---------+-----------+----------+--------------+ FV Mid   Full                                                        +---------+---------------+---------+-----------+----------+--------------+ FV DistalFull                                                        +---------+---------------+---------+-----------+----------+--------------+ PFV      Full                                                        +---------+---------------+---------+-----------+----------+--------------+ POP      Full           Yes      Yes                                 +---------+---------------+---------+-----------+----------+--------------+  PTV      Full                                                         +---------+---------------+---------+-----------+----------+--------------+ PERO     Full                                                        +---------+---------------+---------+-----------+----------+--------------+   +---------+---------------+---------+-----------+----------+--------------+ LEFT     CompressibilityPhasicitySpontaneityPropertiesThrombus Aging +---------+---------------+---------+-----------+----------+--------------+ CFV      Full           Yes      Yes                                 +---------+---------------+---------+-----------+----------+--------------+ SFJ      Full                                                        +---------+---------------+---------+-----------+----------+--------------+ FV Prox  Full                                                        +---------+---------------+---------+-----------+----------+--------------+ FV Mid   Full                                                        +---------+---------------+---------+-----------+----------+--------------+ FV DistalFull                                                        +---------+---------------+---------+-----------+----------+--------------+ PFV      Full                                                        +---------+---------------+---------+-----------+----------+--------------+ POP      Full           Yes      Yes                                 +---------+---------------+---------+-----------+----------+--------------+ PTV      Full                                                        +---------+---------------+---------+-----------+----------+--------------+  PERO     Full                                                        +---------+---------------+---------+-----------+----------+--------------+ Poorly visualized bilateral calf veins due to severe pitting edema, seen with  color flow.    Summary: BILATERAL: - No evidence of deep vein thrombosis seen in the lower extremities, bilaterally.  RIGHT: - No cystic structure found in the popliteal fossa.  LEFT: - No cystic structure found in the popliteal fossa.  *See table(s) above for measurements and observations. Electronically signed by Monica Martinez MD on 05/01/2019 at 11:07:40 AM.    Final    Carollee Leitz, MD 05/02/2019, 6:22 AM PGY-1, [place] Intern pager: [phone], text pages welcome

## 2019-05-02 NOTE — Progress Notes (Signed)
CARDIAC REHAB PHASE I   PRE:  Rate/Rhythm: 82 SR    BP: sitting 107/71    SaO2: 100 2L, 93 RA  MODE:  Ambulation: 470 ft   POST:  Rate/Rhythm: 83 SR    BP: sitting 103/78     SaO2: 93 2L  SATURATION QUALIFICATIONS: (This note is used to comply with regulatory documentation for home oxygen)  Patient Saturations on Room Air at Rest = 93% (brief)  Patient Saturations on Room Air while Ambulating = 84%  Patient Saturations on 2 Liters of oxygen while Ambulating = 93%  Please briefly explain why patient needs home oxygen:  Pt moving well but SaO2 dropped quickly on RA (after 100 ft 84 RA). Increased O2 to 2L and she was able to maintain >92 2L rest of walk in hall. No c/o. Discussed HF book with pt. She was receptive although not happy about sodium restrictions. She sts she quit smoking 2 weeks ago. I congratulated her and gave her tips for staying quit. We also discussed the risk of fire with O2 and cigarettes. Encouraged more walking here in hospital and at d/c. Gave her HF video to view as well as booklet. [place], ACSM 05/02/2019 8:49 AM

## 2019-05-02 NOTE — Progress Notes (Signed)
Echocardiogram 2D Echocardiogram has been performed.  Oneal Deputy Neka Bise 05/02/2019, 2:17 PM

## 2019-05-03 DIAGNOSIS — J9690 Respiratory failure, unspecified, unspecified whether with hypoxia or hypercapnia: Secondary | ICD-10-CM | POA: Diagnosis present

## 2019-05-03 DIAGNOSIS — I50813 Acute on chronic right heart failure: Secondary | ICD-10-CM

## 2019-05-03 DIAGNOSIS — I272 Pulmonary hypertension, unspecified: Secondary | ICD-10-CM

## 2019-05-03 DIAGNOSIS — I2723 Pulmonary hypertension due to lung diseases and hypoxia: Secondary | ICD-10-CM | POA: Diagnosis present

## 2019-05-03 DIAGNOSIS — I50810 Right heart failure, unspecified: Secondary | ICD-10-CM | POA: Diagnosis present

## 2019-05-03 DIAGNOSIS — J449 Chronic obstructive pulmonary disease, unspecified: Secondary | ICD-10-CM | POA: Diagnosis present

## 2019-05-03 HISTORY — DX: Respiratory failure, unspecified, unspecified whether with hypoxia or hypercapnia: J96.90

## 2019-05-03 LAB — BASIC METABOLIC PANEL
Anion gap: 13 (ref 5–15)
BUN: 5 mg/dL — ABNORMAL LOW (ref 6–20)
BUN: 6 mg/dL (ref 6–20)
CO2: 47 mmol/L — ABNORMAL HIGH (ref 22–32)
CO2: >50 mmol/L — ABNORMAL HIGH (ref 22–32)
Calcium: 9.6 mg/dL (ref 8.9–10.3)
Calcium: 9.9 mg/dL (ref 8.9–10.3)
Chloride: 80 mmol/L — ABNORMAL LOW (ref 98–111)
Chloride: 78 mmol/L — ABNORMAL LOW (ref 98–111)
Creatinine, Ser: 0.67 mg/dL (ref 0.44–1.00)
Creatinine, Ser: 0.88 mg/dL (ref 0.44–1.00)
GFR calc Af Amer: >60 mL/min (ref >60)
GFR calc Af Amer: >60 mL/min (ref >60)
GFR calc non Af Amer: >60 mL/min (ref >60)
GFR calc non Af Amer: >60 mL/min (ref >60)
Glucose, Bld: 90 mg/dL (ref 70–99)
Glucose, Bld: 89 mg/dL (ref 70–99)
Potassium: 3.2 mmol/L — ABNORMAL LOW (ref 3.5–5.1)
Potassium: 4.4 mmol/L (ref 3.5–5.1)
Sodium: 140 mmol/L (ref 135–145)
Sodium: 140 mmol/L (ref 135–145)

## 2019-05-03 LAB — CBC
HCT: 58.5 % — ABNORMAL HIGH (ref 36.0–46.0)
Hemoglobin: 16.9 g/dL — ABNORMAL HIGH (ref 12.0–15.0)
MCH: 25.3 pg — ABNORMAL LOW (ref 26.0–34.0)
MCHC: 28.9 g/dL — ABNORMAL LOW (ref 30.0–36.0)
MCV: 87.6 fL (ref 80.0–100.0)
Platelets: 234 10*3/uL (ref 150–400)
RBC: 6.68 MIL/uL — ABNORMAL HIGH (ref 3.87–5.11)
RDW: 17.1 % — ABNORMAL HIGH (ref 11.5–15.5)
WBC: 6 10*3/uL (ref 4.0–10.5)
nRBC: 0 % (ref 0.0–0.2)

## 2019-05-03 LAB — MAGNESIUM: Magnesium: 2 mg/dL (ref 1.7–2.4)

## 2019-05-03 MED ORDER — POTASSIUM CHLORIDE CRYS ER 20 MEQ PO TBCR
40.0000 meq | EXTENDED_RELEASE_TABLET | ORAL | Status: AC
  Administered 2019-05-03 (×2): 40 meq via ORAL
  Filled 2019-05-03 (×2): qty 2

## 2019-05-03 NOTE — Progress Notes (Signed)
SATURATION QUALIFICATIONS: (This note is used to comply with regulatory documentation for home oxygen)  Patient Saturations on Room Air at Rest = 86%  Patient Saturations on 2 Liters of oxygen while Ambulating = 95%  Please briefly explain why patient needs home oxygen: Pt desat to 86% on RA at rest.

## 2019-05-03 NOTE — Progress Notes (Addendum)
CCMD called at 2127 to report a 7 beat run of vtach. Assessed the patient to find her in no acute distress and denying any symptoms. She is currently in sinus rhythm.

## 2019-05-03 NOTE — Progress Notes (Signed)
Cardiology Progress Note  Patient ID: Joy [name] MRN: [id number] DOB: 12/05/1960 Date of Encounter: 05/03/2019  Primary Cardiologist: No primary care provider on file.  Subjective  Good diuresis overnight.  Reports breathing much improved.  Lower extremity edema improved.  ROS:  All other ROS reviewed and negative. Pertinent positives noted in the HPI.     Inpatient Medications  Scheduled Meds: . enoxaparin (LOVENOX) injection  40 mg Subcutaneous Q24H  . furosemide  80 mg Intravenous BID  . multivitamin with minerals  1 tablet Oral Daily  . potassium chloride  40 mEq Oral Q4H  . umeclidinium bromide  1 puff Inhalation Daily   Continuous Infusions:  PRN Meds: acetaminophen **OR** acetaminophen, albuterol   Vital Signs   Vitals:   05/02/19 0438 05/02/19 1150 05/02/19 2057 05/03/19 0538  BP: 105/61 105/69 97/63 104/71  Pulse: 61 63 80 75  Resp: _0 Temp: 98.5 F (36.9 C) 98.6 F (37 C) 97.9 F (36.6 C) 98.6 F (37 C)  TempSrc: Oral Oral Oral Oral  SpO2: 100% 96% 90% 96%  Weight:    76.4 kg  Height:        Intake/Output Summary (Last 24 hours) at 05/03/2019 0954 Last data filed at 05/03/2019 0900 Gross per 24 hour  Intake 720 ml  Output 5400 ml  Net -4680 ml   Last 3 Weights 05/03/2019 05/02/2019 05/01/2019  Weight (lbs) 168 lb 8 oz 176 lb 6.4 oz 186 lb 1.1 oz  Weight (kg) 76.431 kg 80.015 kg 84.4 kg      Telemetry  Overnight telemetry shows normal sinus rhythm heart rate in 80s, which I personally reviewed.   ECG  The most recent ECG shows normal sinus rhythm, heart rate 88, biatrial enlargement, RVH noted, which I personally reviewed.   Physical Exam   Vitals:   05/02/19 0438 05/02/19 1150 05/02/19 2057 05/03/19 0538  BP: 105/61 105/69 97/63 104/71  Pulse: 61 63 80 75  Resp: _1 Temp: 98.5 F (36.9 C) 98.6 F (37 C) 97.9 F (36.6 C) 98.6 F (37 C)  TempSrc: Oral Oral Oral Oral  SpO2: 100% 96% 90% 96%  Weight:    76.4 kg   Height:         Intake/Output Summary (Last 24 hours) at 05/03/2019 0954 Last data filed at 05/03/2019 0900 Gross per 24 hour  Intake 720 ml  Output 5400 ml  Net -4680 ml    Last 3 Weights 05/03/2019 05/02/2019 05/01/2019  Weight (lbs) 168 lb 8 oz 176 lb 6.4 oz 186 lb 1.1 oz  Weight (kg) 76.431 kg 80.015 kg 84.4 kg    Body mass index is 26.79 kg/m.  General: Well nourished, well developed, in no acute distress Head: Atraumatic, normal size  Eyes: PEERLA, EOMI  Neck: Supple, JVD noted around 8 to 10 cm of water Endocrine: No thryomegaly Cardiac: Normal S1, S2; RRR; no murmurs, rubs, or gallops Lungs: Diminished breath sounds bilaterally Abd: Soft, nontender, no hepatomegaly  Ext: 1+ pitting edema up to mid shins Musculoskeletal: No deformities, BUE and BLE strength normal and equal Skin: Warm and dry, no rashes   Neuro: Alert and oriented to person, place, time, and situation, CNII-XII grossly intact, no focal deficits  Psych: Normal mood and affect   Labs  High Sensitivity Troponin:   Recent Labs  Lab 04/30/19 1022 04/30/19 1300 04/30/19 1500  TROPONINIHS 102* 102* 89*     Cardiac EnzymesNo results for input(s): TROPONINI  in the last 168 hours. No results for input(s): TROPIPOC in the last 168 hours.  Chemistry Recent Labs  Lab 04/30/19 1022 04/30/19 1056 05/01/19 0429 05/01/19 1454 05/02/19 0546 05/02/19 1640 05/03/19 0615  NA 140   < > 143   < > 139 138 140  K 3.8   < > 4.2   < > 3.7 3.8 3.2*  CL 101   < > 98   < > 87* 81* 80*  CO2 29   < > 38*   < > 40* 46* 47*  GLUCOSE 114*   < > 100*   < > 91 99 90  BUN 6   < > 7   < > 6 5* 5*  CREATININE 0.89   < > 0.92   < > 0.80 0.75 0.67  CALCIUM 9.0   < > 9.0   < > 8.9 9.4 9.6  PROT 5.7*  --  5.6*  --   --   --   --   ALBUMIN 3.3*  --  3.1*  --   --   --   --   AST 23  --  24  --   --   --   --   ALT 16  --  17  --   --   --   --   ALKPHOS 54  --  54  --   --   --   --   BILITOT 1.1  --  1.2  --   --   --   --    GFRNONAA >60   < > >60   < > >60 >60 >60  GFRAA >60   < > >60   < > >60 >60 >60  ANIONGAP 10   < > 7   < > _0 < > = values in this interval not displayed.    Hematology Recent Labs  Lab 05/01/19 0429 05/02/19 0546 05/03/19 0615  WBC 5.2 5.7 6.0  RBC 6.35* 6.43*  6.35* 6.68*  HGB 16.5* 16.3* 16.9*  HCT 56.3* 56.8* 58.5*  MCV 88.7 88.3 87.6  MCH 26.0 25.3* 25.3*  MCHC 29.3* 28.7* 28.9*  RDW 17.0* 17.0* 17.1*  PLT 250 227 234   BNP Recent Labs  Lab 04/30/19 1022  BNP 783.2*    DDimer  Recent Labs  Lab 04/30/19 1902  DDIMER 0.64*     Radiology  ECHOCARDIOGRAM COMPLETE  Result Date: 05/02/2019    ECHOCARDIOGRAM REPORT   Patient Name:   Joy [name] Date of Exam: 05/02/2019 Medical Rec #:  [id number]     Height:       66.5 in Accession #:    [id number]    Weight:       176.4 lb Date of Birth:  Apr 27, 1960     BSA:          1.91 m Patient Age:    58 years      BP:           105/69 mmHg Patient Gender: F             HR:           69 bpm. Exam Location:  Inpatient Procedure: 2D Echo, Color Doppler and Cardiac Doppler                            MODIFIED REPORT: This report was modified by Lake Bells  O'Neal MD on 05/02/2019 due to error.  Indications:     I50.9* Heart failure (unspecified)  History:         Patient has prior history of Echocardiogram examinations, most                  recent 12/25/2018. CHF, Pulmonary HTN and COPD; Risk                  Factors:Hypertension.  Sonographer:     Raquel Sarna Senior RDCS Referring Phys:  [id number] [place] Diagnosing Phys: Eleonore Chiquito MD IMPRESSIONS  1. Left ventricular ejection fraction, by estimation, is 60 to 65%. The left ventricle has normal function. The left ventricle demonstrates regional wall motion abnormalities (see scoring diagram/findings for description). Left ventricular diastolic function could not be evaluated. There is the interventricular septum is flattened in systole and diastole, consistent with right ventricular  pressure and volume overload.  2. Right ventricular systolic function is moderately reduced. The right ventricular size is severely enlarged. There is mildly elevated pulmonary artery systolic pressure. The estimated right ventricular systolic pressure is 40.1 mmHg.  3. Right atrial size was severely dilated.  4. The mitral valve is grossly normal. Trivial mitral valve regurgitation.  5. The aortic valve is tricuspid. Aortic valve regurgitation is not visualized. No aortic stenosis is present.  6. The inferior vena cava is dilated in size with <50% respiratory variability, suggesting right atrial pressure of 15 mmHg. Comparison(s): A prior study was performed on 12/25/2018. No significant change from prior study. Conclusion(s)/Recomendation(s): Findings consistent with Cor Pulmonale. FINDINGS  Left Ventricle: Left ventricular ejection fraction, by estimation, is 60 to 65%. The left ventricle has normal function. The left ventricle demonstrates regional wall motion abnormalities. The left ventricular internal cavity size was normal in size. There is no left ventricular hypertrophy. The interventricular septum is flattened in systole and diastole, consistent with right ventricular pressure and volume overload. The left ventricular diastology could not be evaluated due to abnormal septal motion. Left ventricular diastolic function could not be evaluated. Right Ventricle: The right ventricular size is severely enlarged. No increase in right ventricular wall thickness. Right ventricular systolic function is moderately reduced. There is mildly elevated pulmonary artery systolic pressure. The tricuspid regurgitant velocity is 2.78 m/s, and with an assumed right atrial pressure of 15 mmHg, the estimated right ventricular systolic pressure is 02.7 mmHg. Left Atrium: Left atrial size was normal in size. Right Atrium: Right atrial size was severely dilated. Prominent Eustachian valve. Pericardium: There is no evidence of  pericardial effusion. Mitral Valve: The mitral valve is grossly normal. Trivial mitral valve regurgitation. Tricuspid Valve: The tricuspid valve is grossly normal. Tricuspid valve regurgitation is mild . No evidence of tricuspid stenosis. Aortic Valve: The aortic valve is tricuspid. Aortic valve regurgitation is not visualized. No aortic stenosis is present. Pulmonic Valve: The pulmonic valve was grossly normal. Pulmonic valve regurgitation is not visualized. Aorta: The aortic root and ascending aorta are structurally normal, with no evidence of dilitation. Venous: The inferior vena cava is dilated in size with less than 50% respiratory variability, suggesting right atrial pressure of 15 mmHg. IAS/Shunts: There is left bowing of the interatrial septum, suggestive of elevated right atrial pressure. No atrial level shunt detected by color flow Doppler.  LEFT VENTRICLE PLAX 2D LVIDd:         4.50 cm  Diastology LVIDs:         2.30 cm  LV e' lateral:   7.62 cm/s  LV PW:         1.10 cm  LV E/e' lateral: 11.1 LV IVS:        0.90 cm  LV e' medial:    5.11 cm/s LVOT diam:     1.90 cm  LV E/e' medial:  16.5 LV SV:         59.54 ml LV SV Index:   37.98 LVOT Area:     2.84 cm  RIGHT VENTRICLE RV S prime:     12.70 cm/s TAPSE (M-mode): 1.9 cm LEFT ATRIUM             Index       RIGHT ATRIUM           Index LA diam:        3.30 cm 1.73 cm/m  RA Area:     24.40 cm LA Vol (A2C):   45.3 ml 23.76 ml/m RA Volume:   79.90 ml  41.91 ml/m LA Vol (A4C):   37.3 ml 19.56 ml/m LA Biplane Vol: 41.4 ml 21.71 ml/m  AORTIC VALVE LVOT Vmax:   94.60 cm/s LVOT Vmean:  67.400 cm/s LVOT VTI:    0.210 m  AORTA Ao Root diam: 2.90 cm Ao Asc diam:  3.00 cm MITRAL VALVE               TRICUSPID VALVE MV Area (PHT): 2.99 cm    TR Peak grad:   30.9 mmHg MV Decel Time: 254 msec    TR Vmax:        278.00 cm/s MV E velocity: 84.40 cm/s MV A velocity: 70.30 cm/s  SHUNTS MV E/A ratio:  1.20        Systemic VTI:  0.21 m                            Systemic  Diam: 1.90 cm Eleonore Chiquito MD Electronically signed by Eleonore Chiquito MD Signature Date/Time: 05/02/2019/2:34:40 PM    Final (Updated)    VAS [place] LOWER EXTREMITY VENOUS (DVT)  Result Date: 05/01/2019  Lower Venous DVTStudy Indications: Edema.  Risk Factors: HTN, COPD. Limitations: Body habitus and poor ultrasound/tissue interface. Comparison Study: No prior exam. Performing Technologist: Baldwin Crown ARDMS, RVT  Examination Guidelines: A complete evaluation includes B-mode imaging, spectral Doppler, color Doppler, and power Doppler as needed of all accessible portions of each vessel. Bilateral testing is considered an integral part of a complete examination. Limited examinations for reoccurring indications may be performed as noted. The reflux portion of the exam is performed with the patient in reverse Trendelenburg.  +---------+---------------+---------+-----------+----------+--------------+ RIGHT    CompressibilityPhasicitySpontaneityPropertiesThrombus Aging +---------+---------------+---------+-----------+----------+--------------+ CFV      Full           Yes      Yes                                 +---------+---------------+---------+-----------+----------+--------------+ SFJ      Full                                                        +---------+---------------+---------+-----------+----------+--------------+ FV Prox  Full                                                        +---------+---------------+---------+-----------+----------+--------------+  FV Mid   Full                                                        +---------+---------------+---------+-----------+----------+--------------+ FV DistalFull                                                        +---------+---------------+---------+-----------+----------+--------------+ PFV      Full                                                         +---------+---------------+---------+-----------+----------+--------------+ POP      Full           Yes      Yes                                 +---------+---------------+---------+-----------+----------+--------------+ PTV      Full                                                        +---------+---------------+---------+-----------+----------+--------------+ PERO     Full                                                        +---------+---------------+---------+-----------+----------+--------------+   +---------+---------------+---------+-----------+----------+--------------+ LEFT     CompressibilityPhasicitySpontaneityPropertiesThrombus Aging +---------+---------------+---------+-----------+----------+--------------+ CFV      Full           Yes      Yes                                 +---------+---------------+---------+-----------+----------+--------------+ SFJ      Full                                                        +---------+---------------+---------+-----------+----------+--------------+ FV Prox  Full                                                        +---------+---------------+---------+-----------+----------+--------------+ FV Mid   Full                                                        +---------+---------------+---------+-----------+----------+--------------+  FV DistalFull                                                        +---------+---------------+---------+-----------+----------+--------------+ PFV      Full                                                        +---------+---------------+---------+-----------+----------+--------------+ POP      Full           Yes      Yes                                 +---------+---------------+---------+-----------+----------+--------------+ PTV      Full                                                         +---------+---------------+---------+-----------+----------+--------------+ PERO     Full                                                        +---------+---------------+---------+-----------+----------+--------------+ Poorly visualized bilateral calf veins due to severe pitting edema, seen with color flow.    Summary: BILATERAL: - No evidence of deep vein thrombosis seen in the lower extremities, bilaterally.  RIGHT: - No cystic structure found in the popliteal fossa.  LEFT: - No cystic structure found in the popliteal fossa.  *See table(s) above for measurements and observations. Electronically signed by Monica Martinez MD on 05/01/2019 at 11:07:40 AM.    Final     Cardiac Studies  TTE 05/02/2019 1. Left ventricular ejection fraction, by estimation, is 60 to 65%. The  left ventricle has normal function. The left ventricle demonstrates  regional wall motion abnormalities (see scoring diagram/findings for  description). Left ventricular diastolic  function could not be evaluated. There is the interventricular septum is  flattened in systole and diastole, consistent with right ventricular  pressure and volume overload.  2. Right ventricular systolic function is moderately reduced. The right  ventricular size is severely enlarged. There is mildly elevated pulmonary  artery systolic pressure. The estimated right ventricular systolic  pressure is 74.1 mmHg.  3. Right atrial size was severely dilated.  4. The mitral valve is grossly normal. Trivial mitral valve  regurgitation.  5. The aortic valve is tricuspid. Aortic valve regurgitation is not  visualized. No aortic stenosis is present.  6. The inferior vena cava is dilated in size with <50% respiratory  variability, suggesting right atrial pressure of 15 mmHg.   Comparison(s): A prior study was performed on 12/25/2018. No significant  change from prior study.   Conclusion(s)/Recomendation(s): Findings consistent with Cor  Pulmonale.    LHC/RHC 12/26/2018 Ao = 105/66 (85) LV =  106/12 RA =  9 RV = 75/10 PA = 73/24 (  45) PCW = 14 Fick cardiac output/index = 3.8/2.1 PVR = 8.0 WU Ao sat = 83% (RA - no sedation) PA sat = 54%, 56%  Arterial sats 78-83% on ABG (sats read 85-87% on pulse ox at the time)  ABG in cath lab (RA): 7.45/71/49/83% - no sedation  Assessment: 1. Normal coronaries 2. LVEF 60-65% 3. Moderate PAH due to severe hypoxic lung disease with CO2 retention  Patient Profile  Joy [name] is a 59 y.o. female with with history of COPD, chronic diastolic heart failure, cor pulmonale secondary to pulmonary hypertension from lung disease who was admitted for acute hypoxic respiratory failure on 05/01/2019.  Cardiology asked to evaluate due to decompensated right-sided heart failure.  Assessment & Plan   1.  Acute on chronic hypoxic respiratory failure -Hypoxic on admission.  Chest x-ray with likely COPD.   -She has known COPD and is the etiology for pulmonary hypertension. -We will continue nasal cannula O2 therapy and wean as able.  Likely will need this going home. -Arterial blood gas with significant hypercarbia up to 60.  Further suggestive of COPD.  2.  Pulmonary hypertension with right-sided heart failure/cor pulmonale -Right heart catheterization consistent with group 3 PAH secondary to chronic hypoxic lung disease.  She had a wedge pressure of 14 at that catheterization time. --Still bit volume up today.  We will plan for 80 mg IV Lasix x2 today.  We will stop Lasix after final dose this afternoon 1800. we will plan to likely transition to torsemide 40 mg daily p.o. tomorrow. -Mainstay of treatment for her right heart failure is avoiding hypoxia.  I suspect she will need oxygen going home.  We will have to assess this need. Would consider a pulmonary consult for COPD management. -would obtain PFTs prior to discharge -consider NM V/Q scan as outpatient  -Outpatient sleep study.    For questions or updates, please contact [place] Please consult [email or web address] for contact info under   Signed, Lake Bells T. Audie Box, [place]  05/03/2019 9:54 AM

## 2019-05-04 DIAGNOSIS — I2723 Pulmonary hypertension due to lung diseases and hypoxia: Secondary | ICD-10-CM

## 2019-05-04 DIAGNOSIS — J449 Chronic obstructive pulmonary disease, unspecified: Secondary | ICD-10-CM

## 2019-05-04 LAB — BASIC METABOLIC PANEL
Anion gap: 11 (ref 5–15)
BUN: 8 mg/dL (ref 6–20)
CO2: 44 mmol/L — ABNORMAL HIGH (ref 22–32)
Calcium: 9.5 mg/dL (ref 8.9–10.3)
Chloride: 81 mmol/L — ABNORMAL LOW (ref 98–111)
Creatinine, Ser: 0.8 mg/dL (ref 0.44–1.00)
GFR calc Af Amer: >60 mL/min (ref >60)
GFR calc non Af Amer: >60 mL/min (ref >60)
Glucose, Bld: 98 mg/dL (ref 70–99)
Potassium: 3.6 mmol/L (ref 3.5–5.1)
Sodium: 136 mmol/L (ref 135–145)

## 2019-05-04 LAB — CBC
HCT: 57.1 % — ABNORMAL HIGH (ref 36.0–46.0)
Hemoglobin: 16.6 g/dL — ABNORMAL HIGH (ref 12.0–15.0)
MCH: 25.6 pg — ABNORMAL LOW (ref 26.0–34.0)
MCHC: 29.1 g/dL — ABNORMAL LOW (ref 30.0–36.0)
MCV: 88 fL (ref 80.0–100.0)
Platelets: 241 10*3/uL (ref 150–400)
RBC: 6.49 MIL/uL — ABNORMAL HIGH (ref 3.87–5.11)
RDW: 17.1 % — ABNORMAL HIGH (ref 11.5–15.5)
WBC: 5 10*3/uL (ref 4.0–10.5)
nRBC: 0 % (ref 0.0–0.2)

## 2019-05-04 MED ORDER — POTASSIUM CHLORIDE CRYS ER 20 MEQ PO TBCR
40.0000 meq | EXTENDED_RELEASE_TABLET | Freq: Once | ORAL | Status: AC
  Administered 2019-05-04: 40 meq via ORAL
  Filled 2019-05-04: qty 2

## 2019-05-04 MED ORDER — TORSEMIDE 20 MG PO TABS
40.0000 mg | ORAL_TABLET | Freq: Every day | ORAL | Status: DC
  Administered 2019-05-04: 11:00:00 40 mg via ORAL
  Filled 2019-05-04: qty 2

## 2019-05-04 MED ORDER — TORSEMIDE 20 MG PO TABS: 40.0000 mg | ORAL_TABLET | Freq: Every day | ORAL | 0 refills | Status: DC

## 2019-05-04 MED FILL — TORSEMIDE 20 MG TABLET: 20 | 30 days supply | Qty: 60 | Fill #0

## 2019-05-04 NOTE — Progress Notes (Addendum)
Advanced Heart Failure Rounding Note  PCP-Cardiologist: No primary care provider on file.   Subjective:    Diuresing with IV lasix + metolazone. Weight has gone down from 190-->163 pounds. IV lasix stopped last evening.   Feeling much better. Denies SOB. Wants to go home.   Objective:   Weight Range: 74.1 kg Body mass index is 25.98 kg/m.   Vital Signs:   Temp:  [98.5 F (36.9 C)-98.9 F (37.2 C)] 98.7 F (37.1 C) (02/15 1244) Pulse Rate:  [68-75] 75 (02/15 1244) Resp:  [16-18] 16 (02/15 1244) BP: (102-115)/(63-73) 115/73 (02/15 1244) SpO2:  [97 %-99 %] 99 % (02/15 1244) Weight:  [74.1 kg] 74.1 kg (02/15 0444) Last BM Date: 05/03/19  Weight change: Filed Weights   05/02/19 0434 05/03/19 0538 05/04/19 0444  Weight: 80 kg 76.4 kg 74.1 kg    Intake/Output:   Intake/Output Summary (Last 24 hours) at 05/04/2019 1245 Last data filed at 05/04/2019 1044 Gross per 24 hour  Intake 1282 ml  Output 2950 ml  Net -1668 ml      Physical Exam    General:   No resp difficulty HEENT: Normal Neck: Supple. JVP 6-7  . Carotids 2+ bilat; no bruits. No lymphadenopathy or thyromegaly appreciated. Cor: PMI nondisplaced. Regular rate & rhythm. No rubs, gallops or murmurs. Lungs: Clear Abdomen: Soft, nontender, nondistended. No hepatosplenomegaly. No bruits or masses. Good bowel sounds. Extremities: No cyanosis, clubbing, rash, R and LLE unna boots. edema Neuro: Alert & orientedx3, cranial nerves grossly intact. moves all 4 extremities w/o difficulty. Affect pleasant   Telemetry  SR 60-70s    EKG    N/A   Labs    CBC Recent Labs    05/03/19 0615 05/04/19 0844  WBC 6.0 5.0  HGB 16.9* 16.6*  HCT 58.5* 57.1*  MCV 87.6 88.0  PLT 234 264   Basic Metabolic Panel Recent Labs    05/02/19 0546 05/02/19 1640 05/03/19 0615 05/03/19 0615 05/03/19 1234 05/04/19 0844  NA 139   < > 140   < > 140 136  K 3.7   < > 3.2*   < > 4.4 3.6  CL 87*   < > 80*   < > 78* 81*   CO2 40*   < > 47*   < > >50* 44*  GLUCOSE 91   < > 90   < > 89 98  BUN 6   < > 5*   < > 6 8  CREATININE 0.80   < > 0.67   < > 0.88 0.80  CALCIUM 8.9   < > 9.6   < > 9.9 9.5  MG 1.9  --  2.0  --   --   --    < > = values in this interval not displayed.   Liver Function Tests No results for input(s): AST, ALT, ALKPHOS, BILITOT, PROT, ALBUMIN in the last 72 hours. No results for input(s): LIPASE, AMYLASE in the last 72 hours. Cardiac Enzymes No results for input(s): CKTOTAL, CKMB, CKMBINDEX, TROPONINI in the last 72 hours.  BNP: BNP (last 3 results) Recent Labs    12/24/18 0843 04/30/19 1022  BNP 909.6* 783.2*    ProBNP (last 3 results) No results for input(s): PROBNP in the last 8760 hours.   D-Dimer No results for input(s): DDIMER in the last 72 hours. Hemoglobin A1C No results for input(s): HGBA1C in the last 72 hours. Fasting Lipid Panel No results for input(s): CHOL, HDL, LDLCALC, TRIG,  CHOLHDL, LDLDIRECT in the last 72 hours. Thyroid Function Tests No results for input(s): TSH, T4TOTAL, T3FREE, THYROIDAB in the last 72 hours.  Invalid input(s): FREET3  Other results:   Imaging     No results found.   Medications:     Scheduled Medications: . enoxaparin (LOVENOX) injection  40 mg Subcutaneous Q24H  . multivitamin with minerals  1 tablet Oral Daily  . torsemide  40 mg Oral Daily  . umeclidinium bromide  1 puff Inhalation Daily     Infusions:   PRN Medications:  acetaminophen **OR** acetaminophen, albuterol    Assessment/Plan   1. A/C Hypoxic Respiratory Failure O2 sats in the ED 70% on room. Improved saturations to > 90% on 4 liters [place].  -May need home oxygen.   2. A/C Diastolic HF---> R sided failure  ECHO 12/2018 EF 70-75% reduced RV . Repeat ECHO pending.  Appears euvolemic.  - Off iV lasix . Start torsemide 40 mg daily - Restart diamox 250 mg daily.  - Add 25 mg spironolactone daily - Renal function stable.  - Add unna boots to  help with lower extremity edema.   3. Pulmonary HTN  Had RHC 12/2018  RA 9, PA 73/24 (45) , PVR 8, CO 3.8, CI 2.1 . Moderate WHO Group II PAH due to severe hypoxic lung disease. No role for pulmonary vasodilators.  Needs f/u with pulmonary. Needs sleep study. May need oxygen.   4. COPD  Discussed smoking cessation. Needs pulmonary follow up.   5. Contractile Alkalosis Restart diamox.   F/U in HF clinic next week. Check BMET next week.  Torsemide 40 mg daily  Sprio 25 mg daily Diamox 250 mg daily.     Length of Stay: 4  Amy Clegg, NP  05/04/2019, 12:45 PM  Advanced Heart Failure Team Pager [phone] (M-F; 7a - 4p)  Please contact [facility] for night-coverage after hours (4p -7a ) and weekends on [email or web address]  Patient seen and examined with the above-signed Advanced Practice Provider and/or Housestaff. I personally reviewed laboratory data, imaging studies and relevant notes. I independently examined the patient and formulated the important aspects of the plan. I have edited the note to reflect any of my changes or salient points. I have personally discussed the plan with the patient and/or family.  Volume status improved. Feeling much better. Anxious to go home. Ok to d/c today from our standpoint on above meds. Will need outpatient PFTs. Discussed need to stop smoking.   Glori Bickers, MD  3:55 PM

## 2019-05-04 NOTE — Progress Notes (Signed)
Orthopedic Tech Progress Note Patient Details:  Joy [name] Jun 24, 1960 [id number]  Ortho Devices Type of Ortho Device: Haematologist Ortho Device/Splint Location: Bilateral unna boots Ortho Device/Splint Interventions: Application   Post Interventions Patient Tolerated: Well Instructions Provided: Care of device   [name] 05/04/2019, 4:36 PM

## 2019-05-04 NOTE — Discharge Instructions (Signed)
Dear Joy [name],  If shortness of breath returns please contact your provider. Continue to use your daily inhaler and take your diuretics.  POST-HOSPITAL & CARE INSTRUCTIONS 1. Continue to take torsemide 40 mg daily. This is a new medication.  2. Go to your follow up appointments (listed below)   DOCTOR'S APPOINTMENT   Future Appointments  Date Time Provider [place]  05/12/2019  1:30 PM [facility] PA/NP [facility] None  05/13/2019  9:50 AM [place], [name], DO [facility] [place]   Follow-up Information     [facility] Follow up on 05/12/2019.   Specialty: Cardiology Why: 1:30 [place] Code [id number]  Contact information: [address] [id number] [place] [phone]          Take care and be well!  [facility]  [place], [place] [address] [phone]

## 2019-05-04 NOTE — Care Management (Signed)
1329 05-04-19 Case Manager received consult for medication assistance. [facility] uses [facility] spoke with physician and asked if medications can be sent to [facility] and delivered to bedside before patient transitions home. MATCH completed due to patient is without insurance at this time. Durable Medical Equipment oxygen to be delivered to the room via [employer] before the patient transitions home. Prior to arrival patient was from home with husband. Patient gets to her primary care appointments via the bus or her son. Family Medicine to schedule hospital follow up prior to transition home. No further needs identified by Case Manager. Bethena Roys, RN,BSN Case Manager

## 2019-05-04 NOTE — Progress Notes (Addendum)
Family Medicine Teaching Service Daily Progress Note Intern Pager: [phone]  Patient name: Joy [name] record number: [id number] Date of birth: 03-Jul-1960 Age: 59 y.o. Gender: female  Primary Care Provider: Shirley, [name], DO Consultants: None Code Status: DNR  Pt Overview and Major Events to Date:  02/11-admitted  Assessment and Plan: DARRELL LEONHARDT is a 59 y.o. female presenting with progressive SOB and difficulty breathing . PMH is significant for CHF with right heart strain, pulmonary hypertension, COPD, hypertension, tobacco use, THC use  PHTN with right sided heart failure  On 2L O2 satting at 98%, almost 3.2L UOP yesterday, down 12.4kg since admission and ~2.3 kg from yesterday. Her volume status is moving in the right direction. Looking to cardiology for the disposition end point. AM labs pending - f/u cards recs  - transition to torsemide 20m daily; consider acetazolamide    - oxygen PRN - O2 on ambulation trial - strict I/Os, daily weights - BMP am - continue unna boots -Encourage smoking cessation -Will need outpatient follow-up with pulmonology -Consider outpatient sleep studies  COPD- with hypoxia.  - PFT outpatient v. inpatient - incruse daily -Continue albuterol as needed - wean O2 as able - consider pulmonology referral  HTN- Normotensive.  -Continue to hold spironolactone -Monitor blood pressure  7 Beats of VT on EKG- overnight; no cardiac sx will continue to monitor on telemetry   Hypokalemia 3.6 on BMP this morning- replete PRN; 433m KLOR-CON x1  FEN/GI:  -Heart Healthy Prophylaxis:  -Lovenox  Disposition: Home when medically stable  Subjective:  She is doing well. No questions or concerns at this time.  Objective: Temp:  [98.5 F (36.9 C)-98.9 F (37.2 C)] 98.9 F (37.2 C) (02/15 0440) Pulse Rate:  [68-72] 68 (02/15 0440) Resp:  [18-20] 18 (02/15 0440) BP: (102-113)/(63-84) 105/64 (02/15 0440) SpO2:  [97 %-100 %] 97 %  (02/15 0440) Weight:  [74.1 kg] 74.1 kg (02/15 0444)  Physical Exam:  General: Appears well, no acute distress. Age appropriate. Sitting up in bed w/ HOB elevated  Cardiac: RRR, normal heart sounds, no murmurs Respiratory: CTAB, normal effort Extremities: LE edema w/ unna boots on Skin: Warm and dry, no rashes noted Neuro: alert and oriented, no focal deficits Psych: normal affect   Laboratory: Recent Labs  Lab 05/01/19 0429 05/02/19 0546 05/03/19 0615  WBC 5.2 5.7 6.0  HGB 16.5* 16.3* 16.9*  HCT 56.3* 56.8* 58.5*  PLT 250 227 234   Recent Labs  Lab 04/30/19 1022 04/30/19 1056 05/01/19 0429 05/01/19 1454 05/02/19 1640 05/03/19 0615 05/03/19 1234  NA 140   < > 143   < > 138 140 140  K 3.8   < > 4.2   < > 3.8 3.2* 4.4  CL 101   < > 98   < > 81* 80* 78*  CO2 29   < > 38*   < > 46* 47* >50*  BUN 6   < > 7   < > 5* 5* 6  CREATININE 0.89   < > 0.92   < > 0.75 0.67 0.88  CALCIUM 9.0   < > 9.0   < > 9.4 9.6 9.9  PROT 5.7*  --  5.6*  --   --   --   --   BILITOT 1.1  --  1.2  --   --   --   --   ALKPHOS 54  --  54  --   --   --   --  ALT 16  --  17  --   --   --   --   AST 23  --  24  --   --   --   --   GLUCOSE 114*   < > 100*   < > 99 90 89   < > = values in this interval not displayed.    Imaging/Diagnostic Tests: No new imaging  Gerlene Fee, DO 05/04/2019, 9:19 AM PGY-1, [place] Intern pager: [phone], text pages welcome

## 2019-05-04 NOTE — Progress Notes (Signed)
CARDIAC REHAB PHASE I   PRE:  Rate/Rhythm: 72 SR    BP: sitting 113/77    SaO2: 93 2L  MODE:  Ambulation: 770 ft   POST:  Rate/Rhythm: 72 SR    BP: sitting 117/71     SaO2: 92 2L  Pt feeling well. Able to walk increased distance at normal pace. No rest, no c/o. Pt sts she read materials but has not seen educational videos. Encouraged her to watch them and ambulate more. [place], [place] 05/04/2019 11:14 AM

## 2019-05-05 ENCOUNTER — Ambulatory Visit: Payer: Self-pay

## 2019-05-05 LAB — FOLATE RBC
Folate, Hemolysate: 372 ng/mL
Folate, RBC: 685 ng/mL (ref >498)
Hematocrit: 54.3 % — ABNORMAL HIGH (ref 34.0–46.6)

## 2019-05-12 ENCOUNTER — Ambulatory Visit (HOSPITAL_COMMUNITY)
Admit: 2019-05-12 | Discharge: 2019-05-12 | Disposition: A | Discharge status: Home / Self Care | Payer: Self-pay | Source: Ambulatory Visit | Attending: Adult Health | Admitting: Adult Health

## 2019-05-12 ENCOUNTER — Other Ambulatory Visit: Payer: Self-pay

## 2019-05-12 ENCOUNTER — Encounter (HOSPITAL_COMMUNITY): Payer: Self-pay

## 2019-05-12 ENCOUNTER — Telehealth: Payer: Self-pay | Admitting: *Deleted

## 2019-05-12 VITALS — BP 120/84 | HR 71 | Wt 163.0 lb

## 2019-05-12 DIAGNOSIS — Z87891 Personal history of nicotine dependence: Secondary | ICD-10-CM | POA: Insufficient documentation

## 2019-05-12 DIAGNOSIS — I272 Pulmonary hypertension, unspecified: Secondary | ICD-10-CM | POA: Insufficient documentation

## 2019-05-12 DIAGNOSIS — I5032 Chronic diastolic (congestive) heart failure: Secondary | ICD-10-CM | POA: Insufficient documentation

## 2019-05-12 DIAGNOSIS — J449 Chronic obstructive pulmonary disease, unspecified: Secondary | ICD-10-CM | POA: Insufficient documentation

## 2019-05-12 DIAGNOSIS — R0683 Snoring: Secondary | ICD-10-CM

## 2019-05-12 DIAGNOSIS — I50812 Chronic right heart failure: Secondary | ICD-10-CM

## 2019-05-12 DIAGNOSIS — I2723 Pulmonary hypertension due to lung diseases and hypoxia: Secondary | ICD-10-CM

## 2019-05-12 DIAGNOSIS — Z79899 Other long term (current) drug therapy: Secondary | ICD-10-CM | POA: Insufficient documentation

## 2019-05-12 DIAGNOSIS — J9611 Chronic respiratory failure with hypoxia: Secondary | ICD-10-CM | POA: Insufficient documentation

## 2019-05-12 DIAGNOSIS — I110 Hypertensive heart disease with heart failure: Secondary | ICD-10-CM | POA: Insufficient documentation

## 2019-05-12 LAB — BASIC METABOLIC PANEL
Anion gap: 9 (ref 5–15)
BUN: 16 mg/dL (ref 6–20)
CO2: 31 mmol/L (ref 22–32)
Calcium: 9.7 mg/dL (ref 8.9–10.3)
Chloride: 96 mmol/L — ABNORMAL LOW (ref 98–111)
Creatinine, Ser: 1.01 mg/dL — ABNORMAL HIGH (ref 0.44–1.00)
GFR calc Af Amer: >60 mL/min (ref >60)
GFR calc non Af Amer: >60 mL/min (ref >60)
Glucose, Bld: 104 mg/dL — ABNORMAL HIGH (ref 70–99)
Potassium: 4 mmol/L (ref 3.5–5.1)
Sodium: 136 mmol/L (ref 135–145)

## 2019-05-12 NOTE — Progress Notes (Signed)
PCP: Dr [name]  Primary HF Cardiologist: Dr Haroldine Laws  HPI: Joy [name] is a 58 y/oAAfemale smoker w/ COPD, pulmonary nodule (4 mm LLL by CT in 2015 and 2017), chronic diastolic HF, RV failure, pulmonary HTN, essential HTN, and smoker.   Last admitted back in October 2020 with acute hypoxic respiratory failure secondary to COPD and A/C diastolic heart failure. Diuresed with IV lasix. Had RHC/LHC wiith normal coronaries and moderate PAH. PH suspected to be from chronic lung disease. Recommendations for pulmonary follow up and outpatient sleep study and to continue torsemide 20 mg daily. She was not discharged on diuretics.   Admitted 04/30/19 with marked volume overload. Diuresed with IV lasix and transitioned to torsemide 40 mg daily, sprio 25 mg daily, and diamox 250 mg daily.  Overall diuresed 33 pounds. Also discharged on 2 liters [facility] oxygen. Discharge weight 163 pounds.   Today she returns for post hospital HF follow up.Overall feeling fine. Wearing oxygen 24 hours a day.  Mild SOB with exertion. Denies PND/Orthopnea. Appetite ok. No fever or chills. Weight at home 159-162 pounds. Taking all medications.  No longer smoking.  Echo 12/2018-EF 70-75% RV severely enlarged with moderately reduced RV function.   RHC/LHC 12/26/18 Normal coronaries LVEF 60-65% RA 9 PA 73/24 (45) PCWP 14 Fick CO-CI = 3.8/2.1 PVR 8.0   ROS: All systems negative except as listed in HPI, PMH and Problem List.  SH:  Social History   Socioeconomic History  . Marital status: Married    Spouse name: Not on file  . Number of children: Not on file  . Years of education: Not on file  . Highest education level: Not on file  Occupational History  . Not on file  Tobacco Use  . Smoking status: Former Smoker    Packs/day: 0.15    Years: 36.00    Pack years: 5.40    Types: Cigarettes    Quit date: 03/23/2019    Years since quitting: 0.1  . Smokeless tobacco: Former Systems developer    Types: Chew  . Tobacco  comment: "quit chewing in my early '20s"  Substance and Sexual Activity  . Alcohol use: Yes    Alcohol/week: 23.0 standard drinks    Types: 12 Cans of beer, 11 Shots of liquor per week  . Drug use: Yes    Types: Marijuana    Comment: 08/08/2015 "I smoke marijuana when I have it; probably a couple times/week"  . Sexual activity: Yes  Other Topics Concern  . Not on file  Social History Narrative  . Not on file   Social Determinants of Health   Financial Resource Strain: High Risk  . Difficulty of Paying Living Expenses: Hard  Food Insecurity: Food Insecurity Present  . Worried About Charity fundraiser in the Last Year: Never true  . Ran Out of Food in the Last Year: Sometimes true  Transportation Needs: Unmet Transportation Needs  . Lack of Transportation (Medical): Yes  . Lack of Transportation (Non-Medical): Yes  Physical Activity: Inactive  . Days of Exercise per Week: 0 days  . Minutes of Exercise per Session: 0 min  Stress: No Stress Concern Present  . Feeling of Stress : Not at all  Social Connections:   . Frequency of Communication with Friends and Family: Not on file  . Frequency of Social Gatherings with Friends and Family: Not on file  . Attends Religious Services: Not on file  . Active Member of Clubs or Organizations: Not on file  .  Attends Archivist Meetings: Not on file  . Marital Status: Not on file  Intimate Partner Violence:   . Fear of Current or Ex-Partner: Not on file  . Emotionally Abused: Not on file  . Physically Abused: Not on file  . Sexually Abused: Not on file    FH: No family history on file.  Past Medical History:  Diagnosis Date  . Acute congestive heart failure ([place])   . Acute dyspnea   . Acute on chronic diastolic CHF (congestive heart failure) ([place]) 08/11/2015  . Acute right heart failure ([place]) 08/11/2015  . Bilateral lower extremity edema   . CHF (congestive heart failure) ([place])   . COPD (chronic obstructive pulmonary  disease) ([place]) 08/10/2015   Dx on XRAY 08/08/15.  Needs PFTs   . GASTROESOPHAGEAL REFLUX, NO ESOPHAGITIS 05/16/2006   Qualifier: Diagnosis of  By: Drucie Ip    . GERD (gastroesophageal reflux disease)   . Hypertension   . Hypervolemia   . Leg swelling hospitalized 08/08/2015   BLE  . PELVIC MASS 07/05/2008   Qualifier: Diagnosis of  By: McDiarmid MD, Sherren Mocha    . Pulmonary hypertension ([place])   . Right heart failure ([place]) 08/10/2015  . Tobacco abuse 05/16/2006   Qualifier: Diagnosis of  By: Drucie Ip      Current Outpatient Medications  Medication Sig Dispense Refill  . acetaZOLAMIDE (DIAMOX) 250 MG tablet Take 250 mg by mouth daily.    Marland Kitchen albuterol (VENTOLIN HFA) 108 (90 Base) MCG/ACT inhaler Inhale 2-4 puffs into the lungs every 4 (four) hours as needed for wheezing (or cough). 8.5 g 1  . Multiple Vitamin (MULTIVITAMIN WITH MINERALS) TABS tablet Take 1 tablet by mouth daily.    Marland Kitchen spironolactone (ALDACTONE) 25 MG tablet TAKE 1 TABLET BY MOUTH EVERY DAY (Patient taking differently: Take 25 mg by mouth daily. ) 30 tablet 2  . torsemide (DEMADEX) 20 MG tablet Take 2 tablets (40 mg total) by mouth daily. 60 tablet 0  . umeclidinium bromide (INCRUSE ELLIPTA) 62.5 MCG/INH AEPB Inhale 1 puff into the lungs daily. (Patient not taking: Reported on 05/12/2019) 1 each 0  . umeclidinium bromide (INCRUSE ELLIPTA) 62.5 MCG/INH AEPB Inhale 1 puff into the lungs daily. (Patient not taking: Reported on 04/30/2019) 4 each 0   No current facility-administered medications for this encounter.    Vitals:   05/12/19 1345  BP: 120/84  Pulse: 71  SpO2: 97%  Weight: 73.9 kg   Wt Readings from Last 3 Encounters:  05/12/19 73.9 kg  05/04/19 74.1 kg  12/30/18 77.3 kg     PHYSICAL EXAM: General:  Well appearing. No resp difficulty HEENT: normal Neck: supple. JVP flat. Carotids 2+ bilaterally; no bruits. No lymphadenopathy or thryomegaly appreciated. Cor: PMI normal. Regular rate & rhythm. No rubs,  gallops or murmurs. Lungs: clear on 2 liters oxygen Abdomen: soft, nontender, nondistended. No hepatosplenomegaly. No bruits or masses. Good bowel sounds. Extremities: no cyanosis, clubbing, rash,  R and LLE unna boots. No edema Neuro: alert & orientedx3, cranial nerves grossly intact. Moves all 4 extremities w/o difficulty. Affect pleasant.     ASSESSMENT & PLAN: I reviewed DC summary.   1. Chronic Hypoxic Respiratory Failure Continue 2 liters oxygen. Sats stable.   2. Chronic Diastolic HF---> R sided failure  ECHO 04/2019 EF 60-65%.  RV moderately reduced.  NYHA III. Volume status stable. Continue torsemide 40 mg daily.  - I offered to take off unna boots however she wants to hold off take  them off at her house.  - Check BMET today.   3. Pulmonary HTN  Had RHC 12/2018  RA 9, PA 73/24 (45) , PVR 8, CO 3.8, CI 2.1 . Moderate WHO Group II PAH due to severe hypoxic lung disease. No role for pulmonary vasodilators.  - Refer to pulmonary  - Set up home sleep study.   4. COPD  Referred to pulmonary today  Follow up in 4 weeks to reassess volume status. Check BMET today. Discussed  low salt food choices and limiting fluid intake to < 2 liters per day.    Niurka Benecke NP-C  2:17 PM

## 2019-05-12 NOTE — Telephone Encounter (Signed)
LVM to call office to go over screening questions prior to visit.Joy [name], CMA  

## 2019-05-12 NOTE — Patient Instructions (Signed)
Lab work done today. We will notify you of any abnormal lab work. No news is good news!  Your provider has recommended that you have a home sleep study.  [employer] is the company that does these test.  They will contact you by phone and must speak with you before they can ship the equipment.  Once they have spoken with you they will send the equipment right to your home with instructions on how to set it up.  Once you have completed the test simply box all the equipment back up and mail back to the company.  IF you have any questions or issues with the equipment please call the company directly at [phone].  If your test is positive for sleep apnea and you need a home CPAP machine you will be contacted by Dr Theodosia Blender office [facility]) to set this up.  Please follow up with the [facility] in 4 weeks.  At the [facility], you and your health needs are our priority. As part of our continuing mission to provide you with exceptional heart care, we have created designated Provider Care Teams. These Care Teams include your primary Cardiologist (physician) and Advanced Practice Providers (APPs- Physician Assistants and Nurse Practitioners) who all work together to provide you with the care you need, when you need it.   You may see any of the following providers on your designated Care Team at your next follow up: Marland Kitchen Dr Glori Bickers . Dr Loralie Champagne . Darrick Grinder, NP . Lyda Jester, PA . Audry Riles, PharmD   Please be sure to bring in all your medications bottles to every appointment.

## 2019-05-12 NOTE — Progress Notes (Signed)
Patient Name: Joy [name]         DOB: November 27, 1960      Height:     Weight:163lbs  Office Name: [facility]         Referring Provider: Darrick Grinder NP-C  Today's Date: 05/12/19  Date:   STOP BANG RISK ASSESSMENT S (snore) Have you been told that you snore?     YES   T (tired) Are you often tired, fatigued, or sleepy during the day?   YES  O (obstruction) Do you stop breathing, choke, or gasp during sleep? YES/NO   P (pressure) Do you have or are you being treated for high blood pressure? YES/NO   B (BMI) Is your body index greater than 35 kg/m? YES/NO   A (age) Are you 28 years old or older? YES   N (neck) Do you have a neck circumference greater than 16 inches?   YES/NO   G (gender) Are you a female? YES/NO   TOTAL STOP/BANG "YES" ANSWERS                                                                        For Office Use Only              Procedure Order Form    YES to 3+ Stop Bang questions OR two clinical symptoms - patient qualifies for [employer] (CPT 95800)     Submit: This Form + Patient Face Sheet + Clinical Note via [employer] or Fax: [phone]         Clinical Notes: Will consult Sleep Specialist and refer for management of therapy due to patient increased risk of Sleep Apnea. Ordering a sleep study due to the following two clinical symptoms: Excessive daytime sleepiness G47.10 / Gastroesophageal reflux K21.9 / Nocturia R35.1 / Morning Headaches G44.221 / Difficulty concentrating R41.840 / Memory problems or poor judgment G31.84 / Personality changes or irritability R45.4 / Loud snoring R06.83 / Depression F32.9 / Unrefreshed by sleep G47.8 / Impotence N52.9 / History of high blood pressure R03.0 / Insomnia G47.00    I understand that I am proceeding with a home sleep apnea test as ordered by my treating physician. I understand that untreated sleep apnea is a serious cardiovascular risk factor and it is my responsibility to perform the test and seek  management for sleep apnea. I will be contacted with the results and be managed for sleep apnea by a local sleep physician. I will be receiving equipment and further instructions from [facility]. I shall promptly ship back the equipment via the included mailing label. I understand my insurance will be billed for the test and as the patient I am responsible for any insurance related out-of-pocket costs incurred. I have been provided with written instructions and can call for additional video or telephonic instruction, with 24-hour availability of qualified personnel to answer any questions: Patient Help Desk [phone].   Patient Telemedicine Verbal Consent

## 2019-05-12 NOTE — Addendum Note (Signed)
Encounter addended by: Marlise Eves, RN on: 05/12/2019 2:35 PM  Actions taken: Clinical Note Signed

## 2019-05-13 ENCOUNTER — Ambulatory Visit: Payer: Self-pay

## 2019-05-13 ENCOUNTER — Ambulatory Visit (INDEPENDENT_AMBULATORY_CARE_PROVIDER_SITE_OTHER): Payer: Self-pay | Admitting: Family Medicine

## 2019-05-13 ENCOUNTER — Other Ambulatory Visit: Payer: Self-pay

## 2019-05-13 ENCOUNTER — Telehealth (HOSPITAL_COMMUNITY): Payer: Self-pay

## 2019-05-13 ENCOUNTER — Encounter: Payer: Self-pay | Admitting: Family Medicine

## 2019-05-13 VITALS — BP 92/60 | HR 78 | Ht 66.0 in | Wt 163.0 lb

## 2019-05-13 DIAGNOSIS — I272 Pulmonary hypertension, unspecified: Secondary | ICD-10-CM

## 2019-05-13 DIAGNOSIS — I50813 Acute on chronic right heart failure: Secondary | ICD-10-CM

## 2019-05-13 DIAGNOSIS — J439 Emphysema, unspecified: Secondary | ICD-10-CM

## 2019-05-13 MED ORDER — ANORO ELLIPTA 62.5-25 MCG/INH IN AEPB: 1.0000 | INHALATION_SPRAY | Freq: Every day | RESPIRATORY_TRACT | 0 refills | Status: DC

## 2019-05-13 NOTE — Progress Notes (Signed)
CHIEF COMPLAINT / HPI:  Hospital follow-up: Patient reports that since her hospitalization she has felt much better.  She was admitted due to shortness of breath from pulmonary hypertension with right-sided heart failure as well as COPD.  Patient noncompliant with her medications as she has trouble affording them.  She states that since she has gone home she has felt much better.  She states she only has an intermittent cough that is nonproductive.  She states that she is using 2 L of oxygen at night and then as needed.  She states that she often does not need it during the day.  He has not been able to start the Incruse as she was not able to afford this.  She has been set up with a sleep study that will be done at home and is to be sent to her house.  She states that she is having trouble affording her medications and would like assistance with that.  She is concerned that she will not be able to afford her new portable oxygen and that she will not be able to take this to work so she will not be able to afford housing or food.  She denies any fevers, chills, dizziness, lightheadedness, shortness of breath, leg swelling.  Per [facility] she was discharged home on Torsemide 53m daily, Spironolactone 229mdaily, and Diamox 2503maily. She had not been taking Sprionolactone or Diamox at home recently but had been prescribed these medications in the past. It was also recommended that she follow up with pulmonology to evaluate her for appropriateness of vasodilators and a sleep study.   PERTINENT  PMH / PSH: Pulmonary hypertension with right-sided heart failure, COPD, HTN, HFpEF  OBJECTIVE: BP 92/60   Pulse 78   Ht _0  (1.676 m)   Wt 163 lb (73.9 kg)   SpO2 91% Comment: Provider informed oxygen is 91% w/o oxygen  BMI 26.31 kg/m   General: NAD, pleasant Neck: Supple Cardiovascular: RRR, no m/r/g, no LE edema Respiratory: CTA BL, normal work of breathing on room air, good air exchange  noted throughout with no crackles Neuro: CN II-XII grossly intact Psych: AOx3, flat affect  ASSESSMENT / PLAN:  Pulmonary hypertension ([place] doing much better after being discharged from the hospital.  Physical exam reassuring today.  Patient ambulated in hallway and did not desat below 91% on room air.  Likely that she will be able to go back to work without use of oxygen.  She has oxygen in place at home that will be covered, and socEducation officer, museumday followed up that patient would be able to exchange her current tank for another tank if she runs out of oxygen. -Have placed referral for her to be evaluated by pulmonology for her chronic lung disease.   -She is set up to have her sleep study done at home.   -Have placed referral for CCM in order to help patient with affording her medications.   -Have provided her with samples of Anoro Ellipta as these are the only medications we had available in our office-but given her advanced disease likely that she should be on Anoro rather than Incruse. -Patient provided with paperwork in order to try to set up orange card  Right [facility] ([facility] instructed to continue torsemide 40 mg daily along with spironolactone 25 mg daily and Diamox 250 mg daily which she states she has been compliant with since leaving the hospital.  Given refill of  these medications.    [name] Hosey Burmester, DO PGY-3, [facility]

## 2019-05-13 NOTE — Chronic Care Management (AMB) (Deleted)
   RN Case Manager Care Management Consultation  05/13/2019 Name: Joy [name] MRN: [id number] DOB: 01-Oct-1960   Joy [name] is a 59 y.o. year old female who sees [place], [name], DO for primary care. RN Case Manager was consulted by Dr. [name] Shirley for information /resources to assistance patient with  Care Coordination with Insurance DME and Pharmacy needs. Patient was  seen during this encounter RN Case Manager reviewed chart, notes, insurance and collaboration with *** .     Recommendation: After consultation with provider it is determined that patient may benefit from ***.    Intervention: Relevant information and resources discussed and shared with provider. Provider will give information to patient   Plan: The care management team is available to follow up with the patient after provider's conversation with the patient regarding recommendation for care management engagement and subsequent referral to the care management team.  No further follow up required by RN Case Manager at this time

## 2019-05-13 NOTE — Patient Instructions (Signed)
Thank you for coming to see me today. It was a pleasure! Today we talked about:   Please use the Anoro inhaler that we provided daily.  You may use the albuterol as needed.  Please continue taking your spironolactone, acetazolamide, and torsemide as prescribed.  I will call you with your lab result.  Please be sure to return the paperwork for your orange card.  Our pharmacy team and social worker will be reaching out to you in order to help you with your medications and possible need for oxygen.  Given that you did not have a trouble breathing as you walk around the office.  May be okay for you to go back to work without your oxygen.  If you begin to feel short of breath then just request to take a break at work.  You may use oxygen at night.  I have also placed a referral for you to follow-up with your lung doctor.  They should call you within the next 2 weeks to schedule an appointment.  If they do not call you to please not hesitate to call our office.  Please follow-up with me in 4-6 weeks or sooner as needed.  If you have any questions or concerns, please do not hesitate to call the office at [phone].  Take Care,   [name] Ebbie Cherry, DO

## 2019-05-13 NOTE — Telephone Encounter (Signed)
Order, OV note, stop bang and demographics all faxed to [employer] at [phone] via epic  Received fax confirmation

## 2019-05-13 NOTE — Progress Notes (Signed)
Ambulatory walk  91% 84bpm

## 2019-05-14 LAB — BASIC METABOLIC PANEL
BUN/Creatinine Ratio: 23 (ref 12–28)
BUN: 17 mg/dL (ref 8–27)
CO2: 27 mmol/L (ref 20–29)
Calcium: 10.2 mg/dL (ref 8.7–10.3)
Chloride: 93 mmol/L — ABNORMAL LOW (ref 96–106)
Creatinine, Ser: 0.74 mg/dL (ref 0.57–1.00)
GFR calc Af Amer: 102 mL/min/{1.73_m2} (ref >59)
GFR calc non Af Amer: 88 mL/min/{1.73_m2} (ref >59)
Glucose: 81 mg/dL (ref 65–99)
Potassium: 4.6 mmol/L (ref 3.5–5.2)
Sodium: 137 mmol/L (ref 134–144)

## 2019-05-15 NOTE — Assessment & Plan Note (Signed)
Patient instructed to continue torsemide 40 mg daily along with spironolactone 25 mg daily and Diamox 250 mg daily which she states she has been compliant with since leaving the hospital.  Given refill of these medications.

## 2019-05-15 NOTE — Assessment & Plan Note (Signed)
Patient doing much better after being discharged from the hospital.  Physical exam reassuring today.  Patient ambulated in hallway and did not desat below 91% on room air.  Likely that she will be able to go back to work without use of oxygen.  She has oxygen in place at home that will be covered, and Education officer, museum today followed up that patient would be able to exchange her current tank for another tank if she runs out of oxygen. -Have placed referral for her to be evaluated by pulmonology for her chronic lung disease.   -She is set up to have her sleep study done at home.   -Have placed referral for CCM in order to help patient with affording her medications.   -Have provided her with samples of Anoro Ellipta as these are the only medications we had available in our office-but given her advanced disease likely that she should be on Anoro rather than Incruse. -Patient provided with paperwork in order to try to set up orange card

## 2019-05-18 ENCOUNTER — Ambulatory Visit: Payer: Self-pay

## 2019-05-18 NOTE — Chronic Care Management (AMB) (Signed)
Care Management   Initial Visit Note  05/18/2019 Name: Joy [name] MRN: [id number] DOB: 07/02/1960   Assessment: Joy [name] is a 59 y.o. year old female who sees Preston, [name], DO for primary care. The care management team was consulted for assistance with care management and care coordination needs related to Care Coordination for DME needs.   Review of patient status, including review of consultants reports, relevant laboratory and other test results, and collaboration with appropriate care team members and the patient's provider was performed as part of comprehensive patient evaluation and provision of care management services.    SDOH (Social Determinants of Health) assessments performed: No See Care Plan activities for detailed interventions related to [facility])     Outpatient Encounter Medications as of 05/13/2019  Medication Sig  . acetaZOLAMIDE (DIAMOX) 250 MG tablet Take 250 mg by mouth daily.  Marland Kitchen albuterol (VENTOLIN HFA) 108 (90 Base) MCG/ACT inhaler Inhale 2-4 puffs into the lungs every 4 (four) hours as needed for wheezing (or cough).  . Multiple Vitamin (MULTIVITAMIN WITH MINERALS) TABS tablet Take 1 tablet by mouth daily.  Marland Kitchen spironolactone (ALDACTONE) 25 MG tablet TAKE 1 TABLET BY MOUTH EVERY DAY (Patient taking differently: Take 25 mg by mouth daily. )  . torsemide (DEMADEX) 20 MG tablet Take 2 tablets (40 mg total) by mouth daily.  Marland Kitchen umeclidinium-vilanterol (ANORO ELLIPTA) 62.5-25 MCG/INH AEPB Inhale 1 puff into the lungs daily.  . [DISCONTINUED] umeclidinium bromide (INCRUSE ELLIPTA) 62.5 MCG/INH AEPB Inhale 1 puff into the lungs daily. (Patient not taking: Reported on 05/12/2019)  . [DISCONTINUED] umeclidinium bromide (INCRUSE ELLIPTA) 62.5 MCG/INH AEPB Inhale 1 puff into the lungs daily. (Patient not taking: Reported on 04/30/2019)   No facility-administered encounter medications on file as of 05/13/2019.    Goals Addressed            This Visit's Progress   . "I  need help with my oxygen" (pt-stated)       CARE PLAN ENTRY (see longtitudinal plan of care for additional care plan information)  Current Barriers:  . Chronic Disease Management support and education needs related to respiratory issues, DME needs with COPD.  Nurse Case Manager Clinical Goal(s):  Marland Kitchen Over the next 14 days, patient will verbalize understanding of plan for DME needs  Interventions:  . Evaluation of current treatment plan  and patient's adherence to plan as established by provider. . Advised patient that I am going to follow up with [facility] about oxygen and give her a call . Collaborated with Rema Fendt at [facility] regarding Oxygen . Discussed plans with patient for ongoing care management follow up and provided patient with direct contact information for care management team  Patient Self Care Activities:  . Patient verbalizes understanding of plan  . Attends all scheduled provider appointments . Calls provider office for new concerns or questions . Unable to independently self navigate DME services for oxygen  Initial goal documentation         Follow up plan:  The care management team will reach out to the patient again over the next 14 days.  The patient has been provided with contact information for the care management team and has been advised to call with any health related questions or concerns.   [name] was given information about Care Management services today including:  1. Care Management services include personalized support from designated clinical staff supervised by a physician, including individualized plan of care and coordination with other care providers 2.  24/7 contact phone numbers for assistance for urgent and routine care needs. 3. The patient may stop Care Management services at any time (effective at the end of the month) by phone call to the office staff.  Patient agreed to services and verbal consent obtained.  [name] RN, BSN,  [facility] Care Management Coordinator [place] Phone: [phone] Fax: [phone]

## 2019-05-18 NOTE — Chronic Care Management (AMB) (Signed)
Care Management   Follow Up Note   05/18/2019 Name: Joy [name] MRN: [id number] DOB: 06-Jan-1961  Referred by: Shirley, [name], DO Reason for referral : Care Coordination (Care Management  RNCM Oxygen )   Joy [name] is a 58 y.o. year old female who is a primary care patient of Shirley, [name], DO. The care management team was consulted for assistance with care management and care coordination needs.    Review of patient status, including review of consultants reports, relevant laboratory and other test results, and collaboration with appropriate care team members and the patient's provider was performed as part of comprehensive patient evaluation and provision of chronic care management services.    SDOH (Social Determinants of Health) assessments performed: No See Care Plan activities for detailed interventions related to [facility])     Advanced Directives: See Care Plan and Vynca application for related entries.   Goals Addressed            This Visit's Progress   . "I need help with my oxygen" (pt-stated)       CARE PLAN ENTRY (see longtitudinal plan of care for additional care plan information)  Current Barriers:  . Chronic Disease Management support and education needs related to respiratory issues, DME needs with COPD.  Nurse Case Manager Clinical Goal(s):  Marland Kitchen Over the next 14 days, patient will verbalize understanding of plan for DME needs  Interventions:  . Evaluation of current treatment plan  and patient's adherence to plan as established by provider. . Advised patient that I am going to follow up with [facility] about oxygen and give her a call . Collaborated with Rema Fendt at [facility] regarding Oxygen . Discussed plans with patient for ongoing care management follow up and provided patient with direct contact information for care management team  . 05/18/19 . Spoke with the patient today and notified her that if she needs to use her tank and it runs low.  She can take  the tank to the nearest [employer] store and they will refill it for her.  She also has the concentrator at home.  That is all she needs at the moment. . I notified the patient that she needs to get all of the paper work together that [name] needs for the charity care before the 30  days is up or they will send out a bill.  She stated that she was unsure what was needed and she does not have a way to make copies.  I stated that I will contact [facility] and have them send me the information that they need and let her know.  Once she has everything together we can make copies and [name] will come by the office and pick the information up.   Patient Self Care Activities:  . Patient verbalizes understanding of plan  . Attends all scheduled provider appointments . Calls provider office for new concerns or questions . Unable to independently self navigate DME services for oxygen  Please see past updates related to this goal by clicking on the "Past Updates" button in the selected goal          The care management team will reach out to the patient again over the next 10 days.  The patient has been provided with contact information for the care management team and has been advised to call with any health related questions or concerns.   [name] RN, BSN, [facility] Care Management Coordinator [place] Phone:  [phone] Fax: [phone]

## 2019-05-18 NOTE — Patient Instructions (Signed)
Visit Information  Goals Addressed            This Visit's Progress   . "I need help with my oxygen" (pt-stated)       CARE PLAN ENTRY (see longtitudinal plan of care for additional care plan information)  Current Barriers:  . Chronic Disease Management support and education needs related to respiratory issues, DME needs with COPD.  Nurse Case Manager Clinical Goal(s):  Marland Kitchen Over the next 14 days, patient will verbalize understanding of plan for DME needs  Interventions:  . Evaluation of current treatment plan  and patient's adherence to plan as established by provider. . Advised patient that I am going to follow up with [facility] about oxygen and give her a call . Collaborated with Rema Fendt at [facility] regarding Oxygen . Discussed plans with patient for ongoing care management follow up and provided patient with direct contact information for care management team  . 05/18/19 . Spoke with the patient today and notified her that if she needs to use her tank and it runs low.  She can take the tank to the nearest [employer] store and they will refill it for her.  She also has the concentrator at home.  That is all she needs at the moment. . I notified the patient that she needs to get all of the paper work together that [place] needs for the charity care before the 30  days is up or they will send out a bill.  She stated that she was unsure what was needed and she does not have a way to make copies.  I stated that I will contact [employer] and have them send me the information that they need and let her know.  Once she has everything together we can make copies and [name] will come by the office and pick the information up.   Patient Self Care Activities:  . Patient verbalizes understanding of plan  . Attends all scheduled provider appointments . Calls provider office for new concerns or questions . Unable to independently self navigate DME services for oxygen  Please see past updates related  to this goal by clicking on the "Past Updates" button in the selected goal         Ms. Ralphs was given information about Care Management services today including:  1. Care Management services include personalized support from designated clinical staff supervised by her physician, including individualized plan of care and coordination with other care providers 2. 24/7 contact phone numbers for assistance for urgent and routine care needs. 3. The patient may stop CCM services at any time (effective at the end of the month) by phone call to the office staff.  Patient agreed to services and verbal consent obtained.   The patient verbalized understanding of instructions provided today and declined a print copy of patient instruction materials.   The care management team will reach out to the patient again over the next 10 days.  The patient has been provided with contact information for the care management team and has been advised to call with any health related questions or concerns.   [name] RN, BSN, [facility] Care Management Coordinator [place] Phone: [phone] Fax: [phone]

## 2019-05-18 NOTE — Patient Instructions (Signed)
Visit Information  Goals Addressed            This Visit's Progress   . "I need help with my oxygen" (pt-stated)       CARE PLAN ENTRY (see longtitudinal plan of care for additional care plan information)  Current Barriers:  . Chronic Disease Management support and education needs related to respiratory issues, DME needs with COPD.  Nurse Case Manager Clinical Goal(s):  Marland Kitchen Over the next 14 days, patient will verbalize understanding of plan for DME needs  Interventions:  . Evaluation of current treatment plan  and patient's adherence to plan as established by provider. . Advised patient that I am going to follow up with [facility] about oxygen and give her a call . Collaborated with Rema Fendt at [facility] regarding Oxygen . Discussed plans with patient for ongoing care management follow up and provided patient with direct contact information for care management team  Patient Self Care Activities:  . Patient verbalizes understanding of plan  . Attends all scheduled provider appointments . Calls provider office for new concerns or questions . Unable to independently self navigate DME services for oxygen  Initial goal documentation        Ms. Moccio was given information about Care Management services today including:  1. Care Management services include personalized support from designated clinical staff supervised by her physician, including individualized plan of care and coordination with other care providers 2. 24/7 contact phone numbers for assistance for urgent and routine care needs. 3. The patient may stop CCM services at any time (effective at the end of the month) by phone call to the office staff.  Patient agreed to services and verbal consent obtained.   The patient verbalized understanding of instructions provided today and declined a print copy of patient instruction materials.   The care management team will reach out to the patient again over the next 14 days.   The patient has been provided with contact information for the care management team and has been advised to call with any health related questions or concerns.   [name] RN, BSN, [facility] Care Management Coordinator [place] Phone: [phone] Fax: [phone]

## 2019-05-25 ENCOUNTER — Ambulatory Visit: Payer: Self-pay

## 2019-05-25 ENCOUNTER — Other Ambulatory Visit: Payer: Self-pay

## 2019-05-25 NOTE — Patient Instructions (Signed)
Visit Information  Goals Addressed            This Visit's Progress   . "I need help with my oxygen" (pt-stated)       CARE PLAN ENTRY (see longtitudinal plan of care for additional care plan information)  Current Barriers:  . Chronic Disease Management support and education needs related to respiratory issues, DME needs with COPD.  Nurse Case Manager Clinical Goal(s):  Marland Kitchen Over the next 14 days, patient will verbalize understanding of plan for DME needs  Interventions:  . Evaluation of current treatment plan  and patient's adherence to plan as established by provider. . Advised patient that I am going to follow up with [facility] about oxygen and give her a call . Collaborated with Joy [name] at [facility] regarding Oxygen . Discussed plans with patient for ongoing care management follow up and provided patient with direct contact information for care management team  . 05/18/19 . Spoke with the patient today and notified her that if she needs to use her tank and it runs low.  She can take the tank to the nearest [employer] store and they will refill it for her.  She also has the concentrator at home.  That is all she needs at the moment. . I notified the patient that she needs to get all of the paper work together that [place] needs for the charity care before the 30  days is up or they will send out a bill.  She stated that she was unsure what was needed and she does not have a way to make copies.  I stated that I will contact [employer] and have them send me the information that they need and let her know.  Once she has everything together we can make copies and Joy [name] will come by the office and pick the information up.  . 05/25/19 . Spoke with Joy [name] today and informed her that I have the form that she was given from [employer] regarding the financial assistance.  We discussed the papers that she needs to bring that I will make copies of. . Patient states that she will come to the office on  Thursday 05-28-19 before 3 pm. . Joy [name] stated that she will come to the office and pick the papers up from the office for the patient    Patient Self Care Activities:  . Patient verbalizes understanding of plan  . Attends all scheduled provider appointments . Calls provider office for new concerns or questions . Unable to independently self navigate DME services for oxygen  Please see past updates related to this goal by clicking on the "Past Updates" button in the selected goal         Joy [name] was given information about Care Management services today including:  1. Care Management services include personalized support from designated clinical staff supervised by her physician, including individualized plan of care and coordination with other care providers 2. 24/7 contact phone numbers for assistance for urgent and routine care needs. 3. The patient may stop CCM services at any time (effective at the end of the month) by phone call to the office staff.  Patient agreed to services and verbal consent obtained.   The patient verbalized understanding of instructions provided today and declined a print copy of patient instruction materials.   The care management team will reach out to the patient again over the next 14 days.  The patient has been provided with contact information  for the care management team and has been advised to call with any health related questions or concerns.   [name] RN, BSN, [facility] Care Management Coordinator [place] Phone: [phone] Fax: [phone]

## 2019-05-25 NOTE — Chronic Care Management (AMB) (Signed)
Care Management   Follow Up Note   05/25/2019 Name: Joy [name] MRN: [id number] DOB: 1960/09/06  Referred by: Shirley, [name], DO Reason for referral : Care Coordination (Care Management RNCM Oxygen paper work)   Joy [name] is a 59 y.o. year old female who is a primary care patient of Shirley, [name], DO. The care management team was consulted for assistance with care management and care coordination needs.    Review of patient status, including review of consultants reports, relevant laboratory and other test results, and collaboration with appropriate care team members and the patient's provider was performed as part of comprehensive patient evaluation and provision of chronic care management services.    SDOH (Social Determinants of Health) assessments performed: No See Care Plan activities for detailed interventions related to [facility])     Advanced Directives: See Care Plan and Vynca application for related entries.   Goals Addressed            This Visit's Progress   . "I need help with my oxygen" (pt-stated)       CARE PLAN ENTRY (see longtitudinal plan of care for additional care plan information)  Current Barriers:  . Chronic Disease Management support and education needs related to respiratory issues, DME needs with COPD.  Nurse Case Manager Clinical Goal(s):  Marland Kitchen Over the next 14 days, patient will verbalize understanding of plan for DME needs  Interventions:  . Evaluation of current treatment plan  and patient's adherence to plan as established by provider. . Advised patient that I am going to follow up with [facility] about oxygen and give her a call . Collaborated with Joy [name] at [facility] regarding Oxygen . Discussed plans with patient for ongoing care management follow up and provided patient with direct contact information for care management team  . 05/18/19 . Spoke with the patient today and notified her that if she needs to use her tank and it runs low.  She  can take the tank to the nearest [employer] store and they will refill it for her.  She also has the concentrator at home.  That is all she needs at the moment. . I notified the patient that she needs to get all of the paper work together that [place] needs for the charity care before the 30  days is up or they will send out a bill.  She stated that she was unsure what was needed and she does not have a way to make copies.  I stated that I will contact [employer] and have them send me the information that they need and let her know.  Once she has everything together we can make copies and Joy [name] will come by the office and pick the information up.  . 05/25/19 . Spoke with Joy [name] today and informed her that I have the form that she was given from [employer] regarding the financial assistance.  We discussed the papers that she needs to bring that I will make copies of. . Patient states that she will come to the office on Thursday 05-28-19 before 3 pm. . Joy [name] stated that she will come to the office and pick the papers up from the office for the patient    Patient Self Care Activities:  . Patient verbalizes understanding of plan  . Attends all scheduled provider appointments . Calls provider office for new concerns or questions . Unable to independently self navigate DME services for oxygen  Please see past updates related  to this goal by clicking on the "Past Updates" button in the selected goal          The care management team will reach out to the patient again over the next 14 days.  The patient has been provided with contact information for the care management team and has been advised to call with any health related questions or concerns.    Joy Arms RN, BSN, [facility] Care Management Coordinator [place] Phone: [phone] Fax: [phone]

## 2019-05-29 ENCOUNTER — Other Ambulatory Visit: Payer: Self-pay | Admitting: Family Medicine

## 2019-05-31 ENCOUNTER — Other Ambulatory Visit: Payer: Self-pay | Admitting: Family Medicine

## 2019-06-01 ENCOUNTER — Ambulatory Visit: Payer: Self-pay

## 2019-06-01 NOTE — Chronic Care Management (AMB) (Signed)
Care Management   Follow Up Note   06/01/2019 Name: Joy [name] MRN: [id number] DOB: [birth year]  Referred by: Shirley, [name], DO Reason for referral : Care Coordination (Care Management RNCM Oxygen Paperwork)   Joy [name] is a 59 y.o. year old female who is a primary care patient of Shirley, [name], DO. The care management team was consulted for assistance with care management and care coordination needs.    Review of patient status, including review of consultants reports, relevant laboratory and other test results, and collaboration with appropriate care team members and the patient's provider was performed as part of comprehensive patient evaluation and provision of chronic care management services.    SDOH (Social Determinants of Health) assessments performed: No See Care Plan activities for detailed interventions related to [name])     Advanced Directives: See Care Plan and Vynca application for related entries.   Goals Addressed            This Visit's Progress   . "I need help with my oxygen" (pt-stated)       CARE PLAN ENTRY (see longtitudinal plan of care for additional care plan information)  Current Barriers:  . Chronic Disease Management support and education needs related to respiratory issues, DME needs with COPD.  Nurse Case Manager Clinical Goal(s):  Marland Kitchen Over the next 14 days, patient will verbalize understanding of plan for DME needs  Interventions:  . Evaluation of current treatment plan  and patient's adherence to plan as established by provider. . Advised patient that I am going to follow up with [employer] about oxygen and give her a call . Collaborated with Rema Fendt at [employer] regarding Oxygen . Discussed plans with patient for ongoing care management follow up and provided patient with direct contact information for care management team  . 05/18/19 . Spoke with the patient today and notified her that if she needs to use her tank and it runs low.  She  can take the tank to the nearest [employer] store and they will refill it for her.  She also has the concentrator at home.  That is all she needs at the moment. . I notified the patient that she needs to get all of the paper work together that [place] needs for the charity care before the 30  days is up or they will send out a bill.  She stated that she was unsure what was needed and she does not have a way to make copies.  I stated that I will contact [employer] and have them send me the information that they need and let her know.  Once she has everything together we can make copies and [name] will come by the office and pick the information up.  . 05/25/19 . Spoke with Mrs Eisel today and informed her that I have the form that she was given from [employer] regarding the financial assistance.  We discussed the papers that she needs to bring that I will make copies of. . Patient states that she will come to the office on Thursday 05-28-19 before 3 pm. . [name] stated that she will come to the office and pick the papers up from the office for the patient  05/28/19 . Patient was seen in the office the brought in her papers for me to make copies for the  [place] program and sign the form. Copies were made.  She also gave the papers needed for the orange care and [facility] financial program  to [employer]. [name] with [employer] to let her know that the papers for the program are at the front waiting to be picked up for the patient.   Patient Self Care Activities:  . Patient verbalizes understanding of plan  . Attends all scheduled provider appointments . Calls provider office for new concerns or questions . Unable to independently self navigate DME services for oxygen  Please see past updates related to this goal by clicking on the "Past Updates" button in the selected goal          The care management team will reach out to the patient again over the next 14 days.  The patient  has been provided with contact information for the care management team and has been advised to call with any health related questions or concerns.   [name] RN, BSN, [facility] Care Management Coordinator [name] Phone: [phone] Fax: [phone]

## 2019-06-01 NOTE — Patient Instructions (Signed)
Visit Information  Goals Addressed            This Visit's Progress   . "I need help with my oxygen" (pt-stated)       CARE PLAN ENTRY (see longtitudinal plan of care for additional care plan information)  Current Barriers:  . Chronic Disease Management support and education needs related to respiratory issues, DME needs with COPD.  Nurse Case Manager Clinical Goal(s):  Marland Kitchen Over the next 14 days, patient will verbalize understanding of plan for DME needs  Interventions:  . Evaluation of current treatment plan  and patient's adherence to plan as established by provider. . Advised patient that I am going to follow up with [employer] about oxygen and give her a call . Collaborated with Rema Fendt at [employer] regarding Oxygen . Discussed plans with patient for ongoing care management follow up and provided patient with direct contact information for care management team  . 05/18/19 . Spoke with the patient today and notified her that if she needs to use her tank and it runs low.  She can take the tank to the nearest [employer] store and they will refill it for her.  She also has the concentrator at home.  That is all she needs at the moment. . I notified the patient that she needs to get all of the paper work together that [place] needs for the charity care before the 30  days is up or they will send out a bill.  She stated that she was unsure what was needed and she does not have a way to make copies.  I stated that I will contact [employer] and have them send me the information that they need and let her know.  Once she has everything together we can make copies and [name] will come by the office and pick the information up.  . 05/25/19 . Spoke with Mrs Hasty today and informed her that I have the form that she was given from [employer] regarding the financial assistance.  We discussed the papers that she needs to bring that I will make copies of. . Patient states that she will come to the office on  Thursday 05-28-19 before 3 pm. . [name] stated that she will come to the office and pick the papers up from the office for the patient  05/28/19 . Patient was seen in the office the brought in her papers for me to make copies for the  [place] program and sign the form. Copies were made.  She also gave the papers needed for the orange care and [facility] financial program to [employer]. [name] with [employer] to let her know that the papers for the program are at the front waiting to be picked up for the patient.   Patient Self Care Activities:  . Patient verbalizes understanding of plan  . Attends all scheduled provider appointments . Calls provider office for new concerns or questions . Unable to independently self navigate DME services for oxygen  Please see past updates related to this goal by clicking on the "Past Updates" button in the selected goal         Ms. Pella was given information about Care Management services today including:  1. Care Management services include personalized support from designated clinical staff supervised by her physician, including individualized plan of care and coordination with other care providers 2. 24/7 contact phone numbers for assistance for urgent and routine care needs. 3. The  patient may stop CCM services at any time (effective at the end of the month) by phone call to the office staff.  Patient agreed to services and verbal consent obtained.   The patient verbalized understanding of instructions provided today and declined a print copy of patient instruction materials.   The care management team will reach out to the patient again over the next 14 days.  The patient has been provided with contact information for the care management team and has been advised to call with any health related questions or concerns.   [name] RN, BSN, [facility] Care Management Coordinator [place] Phone:  [phone] Fax: [phone]

## 2019-06-04 ENCOUNTER — Other Ambulatory Visit: Payer: Self-pay

## 2019-06-04 ENCOUNTER — Ambulatory Visit: Payer: Self-pay

## 2019-06-04 NOTE — Chronic Care Management (AMB) (Signed)
  Care Management   Outreach Note  06/04/2019 Name: Joy [name] MRN: [id number] DOB: 10/02/1960  Referred by: Shirley, [name], DO Reason for referral : Care Coordination (Care Managment RNCM [place] Paper work Update.)   An unsuccessful telephone outreach was attempted today. The patient was referred to the case management team for assistance with care management and care coordination.   Follow Up Plan: A HIPPA compliant phone message was left for the patient providing contact information and requesting a return call.  The care management team will reach out to the patient again over the next 5-7 days.   [name] RN, BSN, [facility] Care Management Coordinator [place] Phone: [phone] Fax: [phone]

## 2019-06-09 ENCOUNTER — Telehealth: Payer: Self-pay

## 2019-06-09 ENCOUNTER — Ambulatory Visit (HOSPITAL_COMMUNITY)
Admission: RE | Admit: 2019-06-09 | Discharge: 2019-06-09 | Disposition: A | Discharge status: Home / Self Care | Payer: Self-pay | Source: Ambulatory Visit | Attending: Adult Health | Admitting: Adult Health

## 2019-06-09 ENCOUNTER — Other Ambulatory Visit: Payer: Self-pay

## 2019-06-09 VITALS — BP 110/76 | HR 70 | Wt 164.5 lb

## 2019-06-09 DIAGNOSIS — Z79899 Other long term (current) drug therapy: Secondary | ICD-10-CM | POA: Insufficient documentation

## 2019-06-09 DIAGNOSIS — J9611 Chronic respiratory failure with hypoxia: Secondary | ICD-10-CM

## 2019-06-09 DIAGNOSIS — J449 Chronic obstructive pulmonary disease, unspecified: Secondary | ICD-10-CM | POA: Insufficient documentation

## 2019-06-09 DIAGNOSIS — I110 Hypertensive heart disease with heart failure: Secondary | ICD-10-CM | POA: Insufficient documentation

## 2019-06-09 DIAGNOSIS — I5032 Chronic diastolic (congestive) heart failure: Secondary | ICD-10-CM | POA: Insufficient documentation

## 2019-06-09 DIAGNOSIS — Z87891 Personal history of nicotine dependence: Secondary | ICD-10-CM | POA: Insufficient documentation

## 2019-06-09 DIAGNOSIS — R911 Solitary pulmonary nodule: Secondary | ICD-10-CM | POA: Insufficient documentation

## 2019-06-09 DIAGNOSIS — I272 Pulmonary hypertension, unspecified: Secondary | ICD-10-CM

## 2019-06-09 DIAGNOSIS — I50810 Right heart failure, unspecified: Secondary | ICD-10-CM

## 2019-06-09 NOTE — Patient Instructions (Signed)
Your physician recommends that you schedule a follow-up appointment in: 3 months with Dr Haroldine Laws  If you have any questions or concerns before your next appointment please send [place] a message through [place] or call our office at [phone].  At the [facility], you and your health needs are our priority. As part of our continuing mission to provide you with exceptional heart care, we have created designated Provider Care Teams. These Care Teams include your primary Cardiologist (physician) and Advanced Practice Providers (APPs- Physician Assistants and Nurse Practitioners) who all work together to provide you with the care you need, when you need it.   You may see any of the following providers on your designated Care Team at your next follow up: Marland Kitchen Dr Glori Bickers . Dr Loralie Champagne . Darrick Grinder, NP . Lyda Jester, PA . Audry Riles, PharmD   Please be sure to bring in all your medications bottles to every appointment.

## 2019-06-09 NOTE — Patient Instructions (Signed)
Visit Information  Goals Addressed            This Visit's Progress   . "I need help with my oxygen" (pt-stated)       CARE PLAN ENTRY (see longtitudinal plan of care for additional care plan information)  Current Barriers:  . Chronic Disease Management support and education needs related to respiratory issues, DME needs with COPD.  Nurse Case Manager Clinical Goal(s):  Marland Kitchen Over the next 14 days, patient will verbalize understanding of plan for DME needs  Interventions:  . Evaluation of current treatment plan  and patient's adherence to plan as established by provider. . Advised patient that I am going to follow up with [employer] about oxygen and give her a call . Collaborated with Joy [name] at [employer] regarding Oxygen . Discussed plans with patient for ongoing care management follow up and provided patient with direct contact information for care management team  . 05/18/19 . Spoke with the patient today and notified her that if she needs to use her tank and it runs low.  She can take the tank to the nearest [employer] store and they will refill it for her.  She also has the concentrator at home.  That is all she needs at the moment. . I notified the patient that she needs to get all of the paper work together that [place] needs for the charity care before the 30  days is up or they will send out a bill.  She stated that she was unsure what was needed and she does not have a way to make copies.  I stated that I will contact [employer] and have them send me the information that they need and let her know.  Once she has everything together we can make copies and Joy [name] will come by the office and pick the information up.  . 05/25/19 . Spoke with Joy [name] today and informed her that I have the form that she was given from [employer] regarding the financial assistance.  We discussed the papers that she needs to bring that I will make copies of. . Patient states that she will come to the office on  Thursday 05-28-19 before 3 pm. . Joy [name] stated that she will come to the office and pick the papers up from the office for the patient  05/28/19 . Patient was seen in the office the brought in her papers for me to make copies for the  [place] program and sign the form. Copies were made.  She also gave the papers needed for the orange care and [facility] financial program to Joy [name]. Joy [name] with [employer] to let her know that the papers for the program are at the front waiting to be picked up for the patient. 06/04/19 . Patient called and notified that Joy [name] was called and she has picked up the paperwork for [employer].  It has been sent for processing. . She should be hearing back about the [facility] card and [facility] financial sometime this week.   Patient Self Care Activities:  . Patient verbalizes understanding of plan  . Attends all scheduled provider appointments . Calls provider office for new concerns or questions . Unable to independently self navigate DME services for oxygen  Please see past updates related to this goal by clicking on the "Past Updates" button in the selected goal         Joy [name] was given information about Care Management services today  including:  1. Care Management services include personalized support from designated clinical staff supervised by her physician, including individualized plan of care and coordination with other care providers 2. 24/7 contact phone numbers for assistance for urgent and routine care needs. 3. The patient may stop CCM services at any time (effective at the end of the month) by phone call to the office staff.  Patient agreed to services and verbal consent obtained.   The patient verbalized understanding of instructions provided today and declined a print copy of patient instruction materials.    Will follow up with the patient on April 9th 2021  The patient has been provided with contact information  for the care management team and has been advised to call with any health related questions or concerns.   [name] RN, BSN, [facility] Care Management Coordinator [place] Phone: [phone] Fax: [phone]

## 2019-06-09 NOTE — Chronic Care Management (AMB) (Signed)
Chronic Care Management   Follow Up Note   06/09/2019 Name: Joy [name] MRN: [id number] DOB: 26-Dec-1960  Referred by: Shirley, [name], DO Reason for referral : Care Coordination (Care Management RNCM Paper work Update)   Joy [name] is a 59 y.o. year old female who is a primary care patient of Shirley, [name], DO. The CCM team was consulted for assistance with chronic disease management and care coordination needs.    Review of patient status, including review of consultants reports, relevant laboratory and other test results, and collaboration with appropriate care team members and the patient's provider was performed as part of comprehensive patient evaluation and provision of chronic care management services.    SDOH (Social Determinants of Health) assessments performed: No See Care Plan activities for detailed interventions related to Joy [name])     Outpatient Encounter Medications as of 06/04/2019  Medication Sig  . acetaZOLAMIDE (DIAMOX) 250 MG tablet Take 250 mg by mouth daily.  . Multiple Vitamin (MULTIVITAMIN WITH MINERALS) TABS tablet Take 1 tablet by mouth daily.  Marland Kitchen spironolactone (ALDACTONE) 25 MG tablet TAKE 1 TABLET BY MOUTH EVERY DAY  . torsemide (DEMADEX) 20 MG tablet TAKE 1 TABLET BY MOUTH EVERY DAY  . albuterol (VENTOLIN HFA) 108 (90 Base) MCG/ACT inhaler Inhale 2-4 puffs into the lungs every 4 (four) hours as needed for wheezing (or cough). (Patient not taking: Reported on 06/04/2019)  . umeclidinium-vilanterol (ANORO ELLIPTA) 62.5-25 MCG/INH AEPB Inhale 1 puff into the lungs daily. (Patient not taking: Reported on 06/04/2019)  . [DISCONTINUED] spironolactone (ALDACTONE) 25 MG tablet TAKE 1 TABLET BY MOUTH EVERY DAY (Patient taking differently: Take 25 mg by mouth daily. )   No facility-administered encounter medications on file as of 06/04/2019.     Objective:   Goals Addressed            This Visit's Progress   . "I need help with my oxygen" (pt-stated)      CARE PLAN ENTRY (see longtitudinal plan of care for additional care plan information)  Current Barriers:  . Chronic Disease Management support and education needs related to respiratory issues, DME needs with COPD.  Nurse Case Manager Clinical Goal(s):  Marland Kitchen Over the next 14 days, patient will verbalize understanding of plan for DME needs  Interventions:  . Evaluation of current treatment plan  and patient's adherence to plan as established by provider. . Advised patient that I am going to follow up with [employer] about oxygen and give her a call . Collaborated with Rema Fendt at [employer] regarding Oxygen . Discussed plans with patient for ongoing care management follow up and provided patient with direct contact information for care management team  . 05/18/19 . Spoke with the patient today and notified her that if she needs to use her tank and it runs low.  She can take the tank to the nearest [employer] store and they will refill it for her.  She also has the concentrator at home.  That is all she needs at the moment. . I notified the patient that she needs to get all of the paper work together that [place] needs for the charity care before the 30  days is up or they will send out a bill.  She stated that she was unsure what was needed and she does not have a way to make copies.  I stated that I will contact [employer] and have them send me the information that they need and let her know.  Once she  has everything together we can make copies and [name] will come by the office and pick the information up.  . 05/25/19 . Spoke with [name] today and informed her that I have the form that she was given from [employer] regarding the financial assistance.  We discussed the papers that she needs to bring that I will make copies of. . Patient states that she will come to the office on Thursday 05-28-19 before 3 pm. . [name] stated that she will come to the office and pick the papers up from the office for  the patient  05/28/19 . Patient was seen in the office the brought in her papers for me to make copies for the  [place] program and sign the form. Copies were made.  She also gave the papers needed for the orange care and [facility] financial program to [employer]. [name] with [employer] to let her know that the papers for the program are at the front waiting to be picked up for the patient. 06/04/19 . Patient called and notified that [name] was called and she has picked up the paperwork for [employer].  It has been sent for processing. . She should be hearing back about the [name] card and [facility] financial sometime this week.   Patient Self Care Activities:  . Patient verbalizes understanding of plan  . Attends all scheduled provider appointments . Calls provider office for new concerns or questions . Unable to independently self navigate DME services for oxygen  Please see past updates related to this goal by clicking on the "Past Updates" button in the selected goal          Plan: Will follow up with the patient on April 9th 2021  The patient has been provided with contact information for the care management team and has been advised to call with any health related questions or concerns.    [name] RN, BSN, [name] Care Management Coordinator [place] Phone: [phone] Fax: [phone]

## 2019-06-09 NOTE — Progress Notes (Signed)
PCP: Dr [name]  Primary HF Cardiologist: Dr Haroldine Laws  HPI: [name] is a 47 y/oAAfemale smoker w/ COPD, pulmonary nodule (4 mm LLL by CT in 2015 and 2017), chronic diastolic HF, RV failure, pulmonary HTN, essential HTN, and smoker.   Last admitted back in October 2020 with acute hypoxic respiratory failure secondary to COPD and A/C diastolic heart failure. Diuresed with IV lasix. Had RHC/LHC wiith normal coronaries and moderate PAH. PH suspected to be from chronic lung disease. Recommendations for pulmonary follow up and outpatient sleep study and to continue torsemide 20 mg daily. She was not discharged on diuretics.   Admitted 04/30/19 with marked volume overload. Diuresed with IV lasix and transitioned to torsemide 40 mg daily, sprio 25 mg daily, and diamox 250 mg daily.  Overall diuresed 33 pounds. Also discharged on 2 liters [place] oxygen. Discharge weight 163 pounds.   Today she returns for HF follow up.Overall feeling fine. Denies SOB/PND/Orthopnea. Only using oxygen at night. Appetite ok. No fever or chills. She has not been weighing at home. Taking all medications.  Echo 12/2018-EF 70-75% RV severely enlarged with moderately reduced RV function.   RHC/LHC 12/26/18 Normal coronaries LVEF 60-65% RA 9 PA 73/24 (45) PCWP 14 Fick CO-CI = 3.8/2.1 PVR 8.0   ROS: All systems negative except as listed in HPI, PMH and Problem List.  SH:  Social History   Socioeconomic History  . Marital status: Married    Spouse name: Not on file  . Number of children: Not on file  . Years of education: Not on file  . Highest education level: Not on file  Occupational History  . Not on file  Tobacco Use  . Smoking status: Former Smoker    Packs/day: 0.15    Years: 36.00    Pack years: 5.40    Types: Cigarettes    Quit date: 03/23/2019    Years since quitting: 0.2  . Smokeless tobacco: Former Systems developer    Types: Chew  . Tobacco comment: "quit chewing in my early '20s"  Substance and  Sexual Activity  . Alcohol use: Yes    Alcohol/week: 23.0 standard drinks    Types: 12 Cans of beer, 11 Shots of liquor per week  . Drug use: Yes    Types: Marijuana    Comment: 08/08/2015 "I smoke marijuana when I have it; probably a couple times/week"  . Sexual activity: Yes  Other Topics Concern  . Not on file  Social History Narrative  . Not on file   Social Determinants of Health   Financial Resource Strain: High Risk  . Difficulty of Paying Living Expenses: Hard  Food Insecurity: Food Insecurity Present  . Worried About Charity fundraiser in the Last Year: Never true  . Ran Out of Food in the Last Year: Sometimes true  Transportation Needs: Unmet Transportation Needs  . Lack of Transportation (Medical): Yes  . Lack of Transportation (Non-Medical): Yes  Physical Activity: Inactive  . Days of Exercise per Week: 0 days  . Minutes of Exercise per Session: 0 min  Stress: No Stress Concern Present  . Feeling of Stress : Not at all  Social Connections:   . Frequency of Communication with Friends and Family:   . Frequency of Social Gatherings with Friends and Family:   . Attends Religious Services:   . Active Member of Clubs or Organizations:   . Attends Archivist Meetings:   Marland Kitchen Marital Status:   Intimate Partner Violence:   .  Fear of Current or Ex-Partner:   . Emotionally Abused:   Marland Kitchen Physically Abused:   . Sexually Abused:     FH: No family history on file.  Past Medical History:  Diagnosis Date  . Acute congestive heart failure ([place])   . Acute dyspnea   . Acute on chronic diastolic CHF (congestive heart failure) ([place]) 08/11/2015  . Acute right heart failure ([place]) 08/11/2015  . Bilateral lower extremity edema   . CHF (congestive heart failure) ([place])   . COPD (chronic obstructive pulmonary disease) ([place]) 08/10/2015   Dx on XRAY 08/08/15.  Needs PFTs   . GASTROESOPHAGEAL REFLUX, NO ESOPHAGITIS 05/16/2006   Qualifier: Diagnosis of  By: Drucie Ip    .  GERD (gastroesophageal reflux disease)   . Hypertension   . Hypervolemia   . Leg swelling hospitalized 08/08/2015   BLE  . PELVIC MASS 07/05/2008   Qualifier: Diagnosis of  By: McDiarmid MD, Sherren Mocha    . Pulmonary hypertension ([place])   . Right heart failure ([place]) 08/10/2015  . Tobacco abuse 05/16/2006   Qualifier: Diagnosis of  By: Drucie Ip      Current Outpatient Medications  Medication Sig Dispense Refill  . acetaZOLAMIDE (DIAMOX) 250 MG tablet Take 250 mg by mouth daily.    Marland Kitchen albuterol (VENTOLIN HFA) 108 (90 Base) MCG/ACT inhaler Inhale 2-4 puffs into the lungs every 4 (four) hours as needed for wheezing (or cough). 8.5 g 1  . Multiple Vitamin (MULTIVITAMIN WITH MINERALS) TABS tablet Take 1 tablet by mouth daily.    Marland Kitchen spironolactone (ALDACTONE) 25 MG tablet TAKE 1 TABLET BY MOUTH EVERY DAY 30 tablet 2  . torsemide (DEMADEX) 20 MG tablet TAKE 1 TABLET BY MOUTH EVERY DAY 30 tablet 2  . umeclidinium-vilanterol (ANORO ELLIPTA) 62.5-25 MCG/INH AEPB Inhale 1 puff into the lungs daily. 2 each 0   No current facility-administered medications for this encounter.    Vitals:   06/09/19 1338  BP: 110/76  Pulse: 70  SpO2: 92%  Weight: 74.6 kg (164 lb 8 oz)   Wt Readings from Last 3 Encounters:  06/09/19 74.6 kg (164 lb 8 oz)  05/13/19 73.9 kg (163 lb)  05/12/19 73.9 kg (163 lb)     PHYSICAL EXAM: General:  Well appearing. No resp difficulty HEENT: normal Neck: supple. no JVD. Carotids 2+ bilat; no bruits. No lymphadenopathy or thryomegaly appreciated. Cor: PMI nondisplaced. Regular rate & rhythm. No rubs, gallops or murmurs. Lungs: clear Abdomen: soft, nontender, nondistended. No hepatosplenomegaly. No bruits or masses. Good bowel sounds. Extremities: no cyanosis, clubbing, rash, edema Neuro: alert & orientedx3, cranial nerves grossly intact. moves all 4 extremities w/o difficulty. Affect pleasant    ASSESSMENT & PLAN: 1. Chronic Hypoxic Respiratory Failure Continue 2 liters  oxygen at night. O2 sats stable.    2. Chronic Diastolic HF---> R sided failure  ECHO 04/2019 EF 60-65%.  RV moderately reduced. Functional improvement. NYHA II. Volume status stable. Continue torsemide 40 mg daily.  - Reinforced low salt food choices and limiting fluid intake to < 2 liters.   3. Pulmonary HTN  Had RHC 12/2018  RA 9, PA 73/24 (45) , PVR 8, CO 3.8, CI 2.1 . Moderate WHO Group II PAH due to severe hypoxic lung disease. No role for pulmonary vasodilators.  -Referred to pulmonary.  - Set up home sleep study.   4. COPD  Pulmonary referral pending.   Follow up in 43month.    Callahan Wild NP-C  1:46 PM

## 2019-06-15 ENCOUNTER — Telehealth: Payer: Self-pay | Admitting: *Deleted

## 2019-06-15 NOTE — Telephone Encounter (Signed)
Fax came for patient stating patient was unable to afford cash pricing at this time and states she doesn't have any insurance.

## 2019-06-15 NOTE — Telephone Encounter (Signed)
Which medication?  Joy [name] 4:05 PM

## 2019-06-17 NOTE — Telephone Encounter (Signed)
For the itamar sleep study.

## 2019-06-25 ENCOUNTER — Encounter: Payer: Self-pay | Admitting: Critical Care Medicine

## 2019-06-25 ENCOUNTER — Other Ambulatory Visit: Payer: Self-pay

## 2019-06-25 ENCOUNTER — Ambulatory Visit (INDEPENDENT_AMBULATORY_CARE_PROVIDER_SITE_OTHER): Payer: Self-pay | Admitting: Critical Care Medicine

## 2019-06-25 VITALS — BP 116/66 | HR 82 | Temp 97.8°F | Ht 66.5 in | Wt 162.0 lb

## 2019-06-25 DIAGNOSIS — J9611 Chronic respiratory failure with hypoxia: Secondary | ICD-10-CM

## 2019-06-25 DIAGNOSIS — I2729 Other secondary pulmonary hypertension: Secondary | ICD-10-CM

## 2019-06-25 DIAGNOSIS — J9612 Chronic respiratory failure with hypercapnia: Secondary | ICD-10-CM

## 2019-06-25 DIAGNOSIS — D751 Secondary polycythemia: Secondary | ICD-10-CM

## 2019-06-25 MED ORDER — ANORO ELLIPTA 62.5-25 MCG/INH IN AEPB: 1.0000 | INHALATION_SPRAY | Freq: Every day | RESPIRATORY_TRACT | 11 refills | Status: DC

## 2019-06-25 NOTE — Patient Instructions (Addendum)
Thank you for visiting Dr. Carlis Abbott at [facility]. We recommend the following: Orders Placed This Encounter  Procedures  . Ambulatory Referral for DME  . ECHOCARDIOGRAM COMPLETE  . Pulmonary function test  . Split night study   Orders Placed This Encounter  Procedures  . Ambulatory Referral for DME    Referral Priority:   Routine    Referral Type:   Durable Medical Equipment Purchase    Number of Visits Requested:   1  . ECHOCARDIOGRAM COMPLETE    Standing Status:   Future    Standing Expiration Date:   09/23/2020    Order Specific Question:   Where should this test be performed    Answer:   [facility])    Order Specific Question:   Does the patient weigh less than or greater than 250 lbs?    Answer:   Patient weighs less than 250 lbs    Order Specific Question:   Perflutren DEFINITY (image enhancing agent) should be administered unless hypersensitivity or allergy exist    Answer:   Administer Perflutren    Order Specific Question:   Reason for exam-Echo    Answer:   Pulmonary hypertension  416.8 / I27.2  . Pulmonary function test    Standing Status:   Future    Standing Expiration Date:   06/24/2020    Order Specific Question:   Where should this test be performed?    Answer:   [facility]    Order Specific Question:   Full PFT: includes the following: basic spirometry, spirometry pre & post bronchodilator, diffusion capacity (DLCO), lung volumes    Answer:   Full PFT  . Split night study    Standing Status:   Future    Standing Expiration Date:   06/24/2020    Order Specific Question:   Where should this test be performed:    Answer:   [facility] ordered this encounter  Medications  . umeclidinium-vilanterol (ANORO ELLIPTA) 62.5-25 MCG/INH AEPB    Sig: Inhale 1 puff into the lungs daily.    Dispense:  1 each    Refill:  11    Order Specific Question:   Lot Number?    Answer:   [id number]    Order Specific Question:    Expiration Date?    Answer:   08/15/2020    Order Specific Question:   Quantity    Answer:   2    Return in about 1 month (around 07/25/2019).  Try to schedule PFT (breathing test) on the day of a clinic appointment with me.   Echocardiogram (heart test) will be done at [facility] ([address] [place], [place] [address])  [facility] (to pick up new Anoro inhaler): [address], [place], [place] [address]     Please do your part to reduce the spread of COVID-19.

## 2019-06-25 NOTE — Progress Notes (Signed)
Synopsis: Referred in April 2021 for COPD by Kinnie Feil, MD.  Subjective:   PATIENT ID: Joy [name] GENDER: female DOB: April 17, 1960, MRN: [id number]  Chief Complaint  Patient presents with  . Consult    pulmonary emphysema.  sob with exertion.  dry cough, comes and goes.      Joy [name] is a 59 year old woman who presents for evaluation of pulmonary hypertension, chronic hypoxic respiratory failure, and likely COPD.  She was hospitalized in February 2021 for 4 days for decompensated right heart failure, hypoxic respiratory failure, AE COPD.  She was hospitalized in October 2020 for 3 days for the same.  At that time she was discharged on 2 L supplemental oxygen all the time.  She has since stopped using it during the day and only wears it at night per her primary care doctor's recommendations.  She was discharged on Anoro, but was only able to use the inhaler that she was discharged with and has not been able to fill it due to cost.  She uses her albuterol infrequently.  She quit smoking in February after 40 years x 1 pack/day.  Her dyspnea on exertion is significantly improved since having her edema better controlled.  She can walk more than 10 minutes without stopping and thinks she could walk a mile.  She is not sure if she could take a flight of stairs or not.  She denies coughing, sputum production, or wheezing since she was in the hospital in February.  She currently has no leg edema, abdominal distention like she had prior to the hospital.  She sometimes has chest pain she attributes to heartburn.  She had a right heart catheterization done in the fall 2020 demonstrating pulmonary hypertension, presumed to be WHO group III.  She has been recommended by cardiology to have a sleep study performed.  She admits to snoring, but denies history of witnessed apneas.  She wakes refreshed and does not have daytime fatigue.  She has no personal or family history of DVT.  HIV tests have been  negative.  No history of IV drug use, use of diet pills, or amphetamines.  No history of vaping.  There is no family history of heart or lung disease. No known personal or family history of sarcoidosis.  No known history of liver disease.  She is up-to-date on her Covid and flu vaccines.      Past Medical History:  Diagnosis Date  . Acute congestive heart failure ([place])   . Acute dyspnea   . Acute on chronic diastolic CHF (congestive heart failure) ([place]) 08/11/2015  . Acute right heart failure ([place]) 08/11/2015  . Bilateral lower extremity edema   . CHF (congestive heart failure) ([place])   . COPD (chronic obstructive pulmonary disease) ([place]) 08/10/2015   Dx on XRAY 08/08/15.  Needs PFTs   . GASTROESOPHAGEAL REFLUX, NO ESOPHAGITIS 05/16/2006   Qualifier: Diagnosis of  By: Drucie Ip    . GERD (gastroesophageal reflux disease)   . Hypertension   . Hypervolemia   . Leg swelling hospitalized 08/08/2015   BLE  . PELVIC MASS 07/05/2008   Qualifier: Diagnosis of  By: McDiarmid MD, Sherren Mocha    . Pulmonary hypertension ([place])   . Right heart failure ([place]) 08/10/2015  . Tobacco abuse 05/16/2006   Qualifier: Diagnosis of  By: Drucie Ip       Family History  Problem Relation Age of Onset  . Hypertension Brother      Past Surgical History:  Procedure Laterality Date  . RIGHT/LEFT HEART CATH AND CORONARY ANGIOGRAPHY N/A 12/26/2018   Procedure: RIGHT/LEFT HEART CATH AND CORONARY ANGIOGRAPHY;  Surgeon: Jolaine Artist, MD;  Location: [facility];  Service: Cardiovascular;  Laterality: N/A;  . TUBAL LIGATION      Social History   Socioeconomic History  . Marital status: Married    Spouse name: Not on file  . Number of children: Not on file  . Years of education: Not on file  . Highest education level: Not on file  Occupational History  . Not on file  Tobacco Use  . Smoking status: Former Smoker    Packs/day: 1.00    Years: 36.00    Pack years: 36.00    Types: Cigarettes      Quit date: 04/20/2019    Years since quitting: 0.1  . Smokeless tobacco: Former Systems developer    Types: Chew  . Tobacco comment: "quit chewing in my early '20s"  Substance and Sexual Activity  . Alcohol use: Yes    Alcohol/week: 23.0 standard drinks    Types: 12 Cans of beer, 11 Shots of liquor per week  . Drug use: Yes    Types: Marijuana    Comment: 08/08/2015 "I smoke marijuana when I have it; probably a couple times/week"  . Sexual activity: Yes  Other Topics Concern  . Not on file  Social History Narrative  . Not on file   Social Determinants of Health   Financial Resource Strain: High Risk  . Difficulty of Paying Living Expenses: Hard  Food Insecurity: Food Insecurity Present  . Worried About Charity fundraiser in the Last Year: Never true  . Ran Out of Food in the Last Year: Sometimes true  Transportation Needs: Unmet Transportation Needs  . Lack of Transportation (Medical): Yes  . Lack of Transportation (Non-Medical): Yes  Physical Activity: Inactive  . Days of Exercise per Week: 0 days  . Minutes of Exercise per Session: 0 min  Stress: No Stress Concern Present  . Feeling of Stress : Not at all  Social Connections:   . Frequency of Communication with Friends and Family:   . Frequency of Social Gatherings with Friends and Family:   . Attends Religious Services:   . Active Member of Clubs or Organizations:   . Attends Archivist Meetings:   Marland Kitchen Marital Status:   Intimate Partner Violence:   . Fear of Current or Ex-Partner:   . Emotionally Abused:   Marland Kitchen Physically Abused:   . Sexually Abused:      No Known Allergies   Immunization History  Administered Date(s) Administered  . Influenza,inj,Quad PF,6+ Mos 03/06/2018, 12/27/2018  . PFIZER SARS-COV-2 Vaccination 06/04/2019, 06/25/2019  . PPD Test 11/04/2015  . Pneumococcal Polysaccharide-23 08/09/2015, 03/06/2018  . Td 01/17/2005    Outpatient Medications Prior to Visit  Medication Sig Dispense Refill  .  acetaZOLAMIDE (DIAMOX) 250 MG tablet Take 250 mg by mouth daily.    Marland Kitchen albuterol (VENTOLIN HFA) 108 (90 Base) MCG/ACT inhaler Inhale 2-4 puffs into the lungs every 4 (four) hours as needed for wheezing (or cough). 8.5 g 1  . Multiple Vitamin (MULTIVITAMIN WITH MINERALS) TABS tablet Take 1 tablet by mouth daily.    Marland Kitchen spironolactone (ALDACTONE) 25 MG tablet TAKE 1 TABLET BY MOUTH EVERY DAY 30 tablet 2  . torsemide (DEMADEX) 20 MG tablet TAKE 1 TABLET BY MOUTH EVERY DAY 30 tablet 2  . umeclidinium-vilanterol (ANORO ELLIPTA) 62.5-25 MCG/INH AEPB Inhale 1  puff into the lungs daily. 2 each 0   No facility-administered medications prior to visit.    Review of Systems  Constitutional: Negative for chills, fever and weight loss.       Hot flashes  Respiratory: Positive for shortness of breath. Negative for cough, sputum production and wheezing.   Cardiovascular: Negative for chest pain and leg swelling.  Gastrointestinal: Negative for heartburn, nausea and vomiting.  Musculoskeletal: Negative for joint pain and myalgias.  Skin: Negative for rash.  Endo/Heme/Allergies: Negative for environmental allergies.     Objective:   Vitals:   06/25/19 1519 06/25/19 1524  BP:  116/66  Pulse: 82 82  Temp:  97.8 F (36.6 C)  TempSrc:  Temporal  SpO2: 92% 92%  Weight:  162 lb (73.5 kg)  Height:  5' 6.5" (1.689 m)   92% on   RA BMI Readings from Last 3 Encounters:  06/25/19 25.76 kg/m  06/09/19 26.55 kg/m  05/13/19 26.31 kg/m   Wt Readings from Last 3 Encounters:  06/25/19 162 lb (73.5 kg)  06/09/19 164 lb 8 oz (74.6 kg)  05/13/19 163 lb (73.9 kg)    Physical Exam Vitals reviewed.  Constitutional:      General: She is not in acute distress.    Appearance: She is not ill-appearing.  HENT:     Head: Normocephalic and atraumatic.  Eyes:     General: No scleral icterus. Cardiovascular:     Rate and Rhythm: Normal rate and regular rhythm.     Comments: Prominent P2 Pulmonary:      Comments: Breathing comfortably on room air, no conversational dyspnea.  Bibasilar rales, otherwise clear to auscultation bilaterally. Abdominal:     Palpations: Abdomen is soft.     Tenderness: There is no abdominal tenderness.  Musculoskeletal:        General: No swelling or deformity.     Cervical back: Neck supple.  Lymphadenopathy:     Cervical: No cervical adenopathy.  Skin:    General: Skin is warm and dry.     Findings: No rash.  Neurological:     Mental Status: She is alert.     Coordination: Coordination normal.     Comments: Delayed responses to questions.  No focal motor deficits.  Psychiatric:        Mood and Affect: Mood normal.        Behavior: Behavior normal.      CBC    Component Value Date/Time   WBC 5.0 05/04/2019 0844   RBC 6.49 (H) 05/04/2019 0844   HGB 16.6 (H) 05/04/2019 0844   HCT 57.1 (H) 05/04/2019 0844   HCT 54.3 (H) 05/02/2019 0546   PLT 241 05/04/2019 0844   MCV 88.0 05/04/2019 0844   MCH 25.6 (L) 05/04/2019 0844   MCHC 29.1 (L) 05/04/2019 0844   RDW 17.1 (H) 05/04/2019 0844   LYMPHSABS 1.7 04/30/2019 1022   MONOABS 0.6 04/30/2019 1022   EOSABS 0.0 04/30/2019 1022   BASOSABS 0.1 04/30/2019 1022    CHEMISTRY No results for input(s): NA, K, CL, CO2, GLUCOSE, BUN, CREATININE, CALCIUM, MG, PHOS in the last 168 hours. CrCl cannot be calculated (Patient's most recent lab result is older than the maximum 21 days allowed.).   Chest Imaging- films reviewed: CXR, 1 view 04/30/2019-cardiomegaly with loss of AP window.  Increased interstitial markings bilaterally.  CTA chest 08/10/2015-is a system, no significant emphysema.  Very large pulmonary artery.  No PE.  Pulmonary Functions Testing Results: No flowsheet data found.  Echocardiogram 05/02/2019: LVEF 60 to 65% with regional wall motion abnormalities.  Unable to evaluate diastolic function.  Enlarged RV and RA with atrial septal bowing and ventricular septal flattening.  Moderately reduced  RV systolic function.  Trivial MR.  Dilated IVC with limited respiratory variability.  Heart Catheterization 12/26/2018:  LHC/RHC 12/26/2018 Ao = 105/66 (85) LV = 106/12 RA = 9 RV = 75/10 PA = 73/24 (45) PCW = 14 Fick cardiac output/index = 3.8/2.1 PVR = 8.0 WU Ao sat = 83% (RA - no sedation) PA sat = 54%, 56%  Arterial sats 78-83% on ABG (sats read 85-87% on pulse ox at the time)  ABG in cath lab (RA): 7.45/71/49/83% - no sedation     Assessment & Plan:     ICD-10-CM   1. Chronic respiratory failure with hypoxia and hypercapnia (HCC)  J96.11    J96.12   2. Other secondary pulmonary hypertension (HCC)  I27.29     Pulmonary hypertension-possibly WHO group III due to chronic lung disease.  Given her smoking history she probably has chronic COPD.  Her high PVR is concerning and seems disproportionate to findings on her CT scan in 2017.  She is currently very well compensated based on symptoms. -Repeat echocardiogram in a compensated state -Repeat BNP level at follow-up -Needs PFTs, polysomnogram to help optimize pulmonary risk factors -Needs VQ scan to evaluate for CTEPH -Walked in the office today to verify that she does not have exertional desaturations. -Continue 2 L oxygen when sleeping. -Continue diuretic regimen. -Restart Anoro once daily.  Prescribed to community health and wellness.  She indicated that she could afford the $20 co-pay. -Albuterol as needed -Up-to-date on Covid and flu vaccines. -Based on follow-up test results will determine if she is an appropriate candidate for pulmonary vasodilators, which run the risk of worsening hypoxia in chronic lung disease. I anticipate she will be an appropriate candidate.  History of tobacco abuse -Can discuss referral for lung cancer screening at follow-up visit  Chronic polycythemia; increases risk of hypercoagulable events.  The duration seems to be longer than her history of hypoxia. -Need to check EPO  level    Current Outpatient Medications:  .  acetaZOLAMIDE (DIAMOX) 250 MG tablet, Take 250 mg by mouth daily., Disp: , Rfl:  .  albuterol (VENTOLIN HFA) 108 (90 Base) MCG/ACT inhaler, Inhale 2-4 puffs into the lungs every 4 (four) hours as needed for wheezing (or cough)., Disp: 8.5 g, Rfl: 1 .  Multiple Vitamin (MULTIVITAMIN WITH MINERALS) TABS tablet, Take 1 tablet by mouth daily., Disp: , Rfl:  .  spironolactone (ALDACTONE) 25 MG tablet, TAKE 1 TABLET BY MOUTH EVERY DAY, Disp: 30 tablet, Rfl: 2 .  torsemide (DEMADEX) 20 MG tablet, TAKE 1 TABLET BY MOUTH EVERY DAY, Disp: 30 tablet, Rfl: 2 .  umeclidinium-vilanterol (ANORO ELLIPTA) 62.5-25 MCG/INH AEPB, Inhale 1 puff into the lungs daily., Disp: 2 each, Rfl: 0     Julian Hy, DO [facility] 06/25/2019 3:32 PM

## 2019-06-26 ENCOUNTER — Ambulatory Visit: Payer: Self-pay

## 2019-06-26 NOTE — Chronic Care Management (AMB) (Signed)
  Care Management   Outreach Note  06/26/2019 Name: Joy [name] MRN: [id number] DOB: [birth year]  Referred by: Shirley, [name], DO Reason for referral : Care Coordination (Care Management RNCM Paperwork update 2nd attempt)   A second unsuccessful telephone outreach was attempted today. The patient was referred to the case management team for assistance with care management and care coordination.   Follow Up Plan: A HIPPA compliant phone message was left for the patient providing contact information and requesting a return call.  The care management team will reach out to the patient again over the next 7-14 days.   [name] RN, BSN, [facility] Care Management Coordinator [place] Phone: [phone] Fax: [phone]

## 2019-06-26 NOTE — Telephone Encounter (Signed)
Patient has been scheduled for a split night study with [facility].

## 2019-06-26 NOTE — Addendum Note (Signed)
Addended by: Lia Foyer R on: 06/26/2019 11:15 AM   Modules accepted: Orders

## 2019-07-01 ENCOUNTER — Ambulatory Visit: Payer: Self-pay

## 2019-07-03 ENCOUNTER — Other Ambulatory Visit: Payer: Self-pay

## 2019-07-03 ENCOUNTER — Encounter (HOSPITAL_COMMUNITY)
Admission: RE | Admit: 2019-07-03 | Discharge: 2019-07-03 | Disposition: A | Discharge status: Home / Self Care | Payer: Self-pay | Source: Ambulatory Visit | Attending: Critical Care Medicine | Admitting: Critical Care Medicine

## 2019-07-03 ENCOUNTER — Ambulatory Visit (HOSPITAL_COMMUNITY)
Admission: RE | Admit: 2019-07-03 | Discharge: 2019-07-03 | Disposition: A | Discharge status: Home / Self Care | Payer: Self-pay | Source: Ambulatory Visit | Attending: Critical Care Medicine | Admitting: Critical Care Medicine

## 2019-07-03 DIAGNOSIS — I2729 Other secondary pulmonary hypertension: Secondary | ICD-10-CM | POA: Insufficient documentation

## 2019-07-03 DIAGNOSIS — J9612 Chronic respiratory failure with hypercapnia: Secondary | ICD-10-CM | POA: Insufficient documentation

## 2019-07-03 DIAGNOSIS — J9611 Chronic respiratory failure with hypoxia: Secondary | ICD-10-CM | POA: Insufficient documentation

## 2019-07-03 MED ORDER — TECHNETIUM TO 99M ALBUMIN AGGREGATED
1.8000 | Freq: Once | INTRAVENOUS | Status: AC | PRN
  Administered 2019-07-03: 09:00:00 1.8 via INTRAVENOUS

## 2019-07-03 MED ORDER — TECHNETIUM TC 99M DIETHYLENETRIAME-PENTAACETIC ACID
40.3000 | Freq: Once | INTRAVENOUS | Status: AC | PRN
  Administered 2019-07-03: 10:00:00 40.3 via INTRAVENOUS

## 2019-07-06 ENCOUNTER — Telehealth: Payer: Self-pay | Admitting: Critical Care Medicine

## 2019-07-06 MED ORDER — ANORO ELLIPTA 62.5-25 MCG/INH IN AEPB: 1.0000 | INHALATION_SPRAY | Freq: Every day | RESPIRATORY_TRACT | 5 refills | Status: DC

## 2019-07-06 MED FILL — ANORO ELLIPTA 62.5-25 MCG I: 62.5-25 | 30 days supply | Qty: 60 | Fill #0

## 2019-07-06 NOTE — Telephone Encounter (Signed)
Spoke with pt and advised rx for [employer] sent to [facility]. Nothing further is needed.

## 2019-07-08 ENCOUNTER — Other Ambulatory Visit (HOSPITAL_COMMUNITY)
Admission: RE | Admit: 2019-07-08 | Discharge: 2019-07-08 | Disposition: A | Discharge status: Home / Self Care | Payer: HRSA Program | Source: Ambulatory Visit | Attending: Pulmonary Disease | Admitting: Pulmonary Disease

## 2019-07-08 DIAGNOSIS — Z20822 Contact with and (suspected) exposure to covid-19: Secondary | ICD-10-CM | POA: Diagnosis not present

## 2019-07-08 DIAGNOSIS — Z01812 Encounter for preprocedural laboratory examination: Secondary | ICD-10-CM | POA: Insufficient documentation

## 2019-07-08 LAB — SARS CORONAVIRUS 2 (TAT 6-24 HRS): SARS Coronavirus 2: NEGATIVE

## 2019-07-10 ENCOUNTER — Ambulatory Visit (HOSPITAL_BASED_OUTPATIENT_CLINIC_OR_DEPARTMENT_OTHER): Payer: Self-pay | Attending: Critical Care Medicine | Admitting: Pulmonary Disease

## 2019-07-10 ENCOUNTER — Other Ambulatory Visit: Payer: Self-pay

## 2019-07-10 ENCOUNTER — Ambulatory Visit: Payer: Self-pay

## 2019-07-10 DIAGNOSIS — I2729 Other secondary pulmonary hypertension: Secondary | ICD-10-CM | POA: Insufficient documentation

## 2019-07-10 NOTE — Chronic Care Management (AMB) (Signed)
Care Management   Follow Up Note   07/10/2019 Name: Joy [name] MRN: [id number] DOB: Jul 31, 1960  Referred by: Shirley, [name], DO Reason for referral : Care Coordination (Care Management RNCM F/U  Paper Work)   Joy [name] is a 59 y.o. year old female who is a primary care patient of Shirley, [name], DO. The care management team was consulted for assistance with care management and care coordination needs.    Review of patient status, including review of consultants reports, relevant laboratory and other test results, and collaboration with appropriate care team members and the patient's provider was performed as part of comprehensive patient evaluation and provision of chronic care management services.    SDOH (Social Determinants of Health) assessments performed: No See Care Plan activities for detailed interventions related to [facility])     Advanced Directives: See Care Plan and Vynca application for related entries.   Goals Addressed            This Visit's Progress   . "I need help with my oxygen" (pt-stated)       CARE PLAN ENTRY (see longtitudinal plan of care for additional care plan information)  Current Barriers:  . Chronic Disease Management support and education needs related to respiratory issues, DME needs with COPD.  Nurse Case Manager Clinical Goal(s):  Marland Kitchen Over the next 14 days, patient will verbalize understanding of plan for DME needs  Interventions:  . Evaluation of current treatment plan  and patient's adherence to plan as established by provider. . Advised patient that I am going to follow up with [employer] about oxygen and give her a call . Collaborated with Rema Fendt at [employer] regarding Oxygen . Discussed plans with patient for ongoing care management follow up and provided patient with direct contact information for care management team  . 05/18/19 . Spoke with the patient today and notified her that if she needs to use her tank and it runs low.  She  can take the tank to the nearest [employer] store and they will refill it for her.  She also has the concentrator at home.  That is all she needs at the moment. . I notified the patient that she needs to get all of the paper work together that [place] needs for the charity care before the 30  days is up or they will send out a bill.  She stated that she was unsure what was needed and she does not have a way to make copies.  I stated that I will contact [employer] and have them send me the information that they need and let her know.  Once she has everything together we can make copies and [name] will come by the office and pick the information up.  . 05/25/19 . Spoke with Mrs Wyrick today and informed her that I have the form that she was given from [employer] regarding the financial assistance.  We discussed the papers that she needs to bring that I will make copies of. . Patient states that she will come to the office on Thursday 05-28-19 before 3 pm. . [name] stated that she will come to the office and pick the papers up from the office for the patient  05/28/19 . Patient was seen in the office the brought in her papers for me to make copies for the  [place] program and sign the form. Copies were made.  She also gave the papers needed for the orange care and [facility]  financial program to [employer]. [name] with [employer] to let her know that the papers for the program are at the front waiting to be picked up for the patient. 06/04/19 . Patient called and notified that [name] was called and she has picked up the paperwork for [employer].  It has been sent for processing. . She should be hearing back about the [facility] card and [facility] financial sometime this week. . 06/30/21 . Patient called to let me know that she is still working with Insurance account manager. Waiting to hear from [facility] card and cone financial.  Her bills are still coming. . Advised her that the process takes a while but I  will continue to check for her. . 07/10/19 . Spoke with the patient and notified her that I was notified that she was approved 100% through [employer] program. . She has the orange card and still waiting on the [facility] financial. . She is working with her case Insurance underwriter for [employer]  . I will follow up with her in 3 weeks   Patient Self Care Activities:  . Patient verbalizes understanding of plan  . Attends all scheduled provider appointments . Calls provider office for new concerns or questions . Unable to independently self navigate DME services for oxygen  Please see past updates related to this goal by clicking on the "Past Updates" button in the selected goal          The patient has been provided with contact information for the care management team and has been advised to call with any health related questions or concerns.   [name] RN, BSN, [facility] Care Management Coordinator [place] Phone: [phone] Fax: [phone]

## 2019-07-10 NOTE — Patient Instructions (Signed)
Visit Information  Goals Addressed            This Visit's Progress   . "I need help with my oxygen" (pt-stated)       CARE PLAN ENTRY (see longtitudinal plan of care for additional care plan information)  Current Barriers:  . Chronic Disease Management support and education needs related to respiratory issues, DME needs with COPD.  Nurse Case Manager Clinical Goal(s):  Marland Kitchen Over the next 14 days, patient will verbalize understanding of plan for DME needs  Interventions:  . Evaluation of current treatment plan  and patient's adherence to plan as established by provider. . Advised patient that I am going to follow up with [employer] about oxygen and give her a call . Collaborated with Rema Fendt at [employer] regarding Oxygen . Discussed plans with patient for ongoing care management follow up and provided patient with direct contact information for care management team  . 05/18/19 . Spoke with the patient today and notified her that if she needs to use her tank and it runs low.  She can take the tank to the nearest [employer] store and they will refill it for her.  She also has the concentrator at home.  That is all she needs at the moment. . I notified the patient that she needs to get all of the paper work together that [place] needs for the charity care before the 30  days is up or they will send out a bill.  She stated that she was unsure what was needed and she does not have a way to make copies.  I stated that I will contact [employer] and have them send me the information that they need and let her know.  Once she has everything together we can make copies and [name] will come by the office and pick the information up.  . 05/25/19 . Spoke with Mrs Kees today and informed her that I have the form that she was given from [employer] regarding the financial assistance.  We discussed the papers that she needs to bring that I will make copies of. . Patient states that she will come to the office on  Thursday 05-28-19 before 3 pm. . [name] stated that she will come to the office and pick the papers up from the office for the patient  05/28/19 . Patient was seen in the office the brought in her papers for me to make copies for the  [place] program and sign the form. Copies were made.  She also gave the papers needed for the orange care and [facility] financial program to [employer]. [name] with [employer] to let her know that the papers for the program are at the front waiting to be picked up for the patient. 06/04/19 . Patient called and notified that [name] was called and she has picked up the paperwork for [employer].  It has been sent for processing. . She should be hearing back about the [facility] card and [facility] financial sometime this week. Marland Kitchen Spoke with the patient and notified her that I was notified that she was apporved 100% through [employer] program. . She has the orange card and still waiting on the [facility] financial. . She is working with her case Insurance underwriter for [employer]  . I will follow up with her in 3 weeks   Patient Self Care Activities:  . Patient verbalizes understanding of plan  . Attends all scheduled provider appointments . Calls provider  office for new concerns or questions . Unable to independently self navigate DME services for oxygen  Please see past updates related to this goal by clicking on the "Past Updates" button in the selected goal         Ms. Larkey was given information about Care Management services today including:  1. Care Management services include personalized support from designated clinical staff supervised by her physician, including individualized plan of care and coordination with other care providers 2. 24/7 contact phone numbers for assistance for urgent and routine care needs. 3. The patient may stop CCM services at any time (effective at the end of the month) by phone call to the office staff.  Patient agreed to services  and verbal consent obtained.   The patient verbalized understanding of instructions provided today and declined a print copy of patient instruction materials.   The care management team will reach out to the patient again over the next 21 days.  The patient has been provided with contact information for the care management team and has been advised to call with any health related questions or concerns.   [name] RN, BSN, [facility] Care Management Coordinator [place] Phone: [phone] Fax: [phone]

## 2019-07-10 NOTE — Patient Instructions (Signed)
Visit Information  Goals Addressed            This Visit's Progress   . "I need help with my oxygen" (pt-stated)       CARE PLAN ENTRY (see longtitudinal plan of care for additional care plan information)  Current Barriers:  . Chronic Disease Management support and education needs related to respiratory issues, DME needs with COPD.  Nurse Case Manager Clinical Goal(s):  Marland Kitchen Over the next 14 days, patient will verbalize understanding of plan for DME needs  Interventions:  . Evaluation of current treatment plan  and patient's adherence to plan as established by provider. . Advised patient that I am going to follow up with [employer] about oxygen and give her a call . Collaborated with Rema Fendt at [employer] regarding Oxygen . Discussed plans with patient for ongoing care management follow up and provided patient with direct contact information for care management team  . 05/18/19 . Spoke with the patient today and notified her that if she needs to use her tank and it runs low.  She can take the tank to the nearest [employer] store and they will refill it for her.  She also has the concentrator at home.  That is all she needs at the moment. . I notified the patient that she needs to get all of the paper work together that [place] needs for the charity care before the 30  days is up or they will send out a bill.  She stated that she was unsure what was needed and she does not have a way to make copies.  I stated that I will contact [employer] and have them send me the information that they need and let her know.  Once she has everything together we can make copies and [name] will come by the office and pick the information up.  . 05/25/19 . Spoke with Mrs Peden today and informed her that I have the form that she was given from [employer] regarding the financial assistance.  We discussed the papers that she needs to bring that I will make copies of. . Patient states that she will come to the office on  Thursday 05-28-19 before 3 pm. . [name] stated that she will come to the office and pick the papers up from the office for the patient  05/28/19 . Patient was seen in the office the brought in her papers for me to make copies for the  [place] program and sign the form. Copies were made.  She also gave the papers needed for the orange care and [facility] financial program to [employer]. [name] with [employer] to let her know that the papers for the program are at the front waiting to be picked up for the patient. 06/04/19 . Patient called and notified that [name] was called and she has picked up the paperwork for [employer].  It has been sent for processing. . She should be hearing back about the [facility] card and [facility] financial sometime this week. . 06/30/21 . Patient called to let me know that she is still working with Insurance account manager. Waiting to hear from [facility] card and cone financial.  Her bills are still coming. . Advised her that the process takes a while but I will continue to check for her. . 07/10/19 . Spoke with the patient and notified her that I was notified that she was approved 100% through [employer] program. . She has the orange card  and still waiting on the [facility] financial. . She is working with her case Insurance underwriter for [employer]  . I will follow up with her in 3 weeks   Patient Self Care Activities:  . Patient verbalizes understanding of plan  . Attends all scheduled provider appointments . Calls provider office for new concerns or questions . Unable to independently self navigate DME services for oxygen  Please see past updates related to this goal by clicking on the "Past Updates" button in the selected goal         Joy [name] was given information about Care Management services today including:  1. Care Management services include personalized support from designated clinical staff supervised by her physician, including individualized plan of care and  coordination with other care providers 2. 24/7 contact phone numbers for assistance for urgent and routine care needs. 3. The patient may stop CCM services at any time (effective at the end of the month) by phone call to the office staff.  Patient agreed to services and verbal consent obtained.   The patient verbalized understanding of instructions provided today and declined a print copy of patient instruction materials.   The patient has been provided with contact information for the care management team and has been advised to call with any health related questions or concerns.   [name] RN, BSN, [facility] Care Management Coordinator [place] Phone: [phone] Fax: [phone]

## 2019-07-10 NOTE — Chronic Care Management (AMB) (Signed)
Care Management   Follow Up Note   07/10/2019 Name: Joy [name] MRN: [id number] DOB: 1960/08/18  Referred by: Shirley, [name], DO Reason for referral : Care Coordination (Care Management RNCM F/u [place])   Joy [name] is a 59 y.o. year old female who is a primary care patient of Shirley, [name], DO. The care management team was consulted for assistance with care management and care coordination needs.    Review of patient status, including review of consultants reports, relevant laboratory and other test results, and collaboration with appropriate care team members and the patient's provider was performed as part of comprehensive patient evaluation and provision of chronic care management services.    SDOH (Social Determinants of Health) assessments performed: No See Care Plan activities for detailed interventions related to [facility])     Advanced Directives: See Care Plan and Vynca application for related entries.   Goals Addressed            This Visit's Progress   . "I need help with my oxygen" (pt-stated)       CARE PLAN ENTRY (see longtitudinal plan of care for additional care plan information)  Current Barriers:  . Chronic Disease Management support and education needs related to respiratory issues, DME needs with COPD.  Nurse Case Manager Clinical Goal(s):  Marland Kitchen Over the next 14 days, patient will verbalize understanding of plan for DME needs  Interventions:  . Evaluation of current treatment plan  and patient's adherence to plan as established by provider. . Advised patient that I am going to follow up with [employer] about oxygen and give her a call . Collaborated with Rema Fendt at [employer] regarding Oxygen . Discussed plans with patient for ongoing care management follow up and provided patient with direct contact information for care management team  . 05/18/19 . Spoke with the patient today and notified her that if she needs to use her tank and it runs low.  She can  take the tank to the nearest [employer] store and they will refill it for her.  She also has the concentrator at home.  That is all she needs at the moment. . I notified the patient that she needs to get all of the paper work together that [place] needs for the charity care before the 30  days is up or they will send out a bill.  She stated that she was unsure what was needed and she does not have a way to make copies.  I stated that I will contact [employer] and have them send me the information that they need and let her know.  Once she has everything together we can make copies and [name] will come by the office and pick the information up.  . 05/25/19 . Spoke with Mrs Pendergrass today and informed her that I have the form that she was given from [employer] regarding the financial assistance.  We discussed the papers that she needs to bring that I will make copies of. . Patient states that she will come to the office on Thursday 05-28-19 before 3 pm. . [name] stated that she will come to the office and pick the papers up from the office for the patient  05/28/19 . Patient was seen in the office the brought in her papers for me to make copies for the  [place] program and sign the form. Copies were made.  She also gave the papers needed for the orange care and [facility] financial program  to [employer]. [name] with [employer] to let her know that the papers for the program are at the front waiting to be picked up for the patient. 06/04/19 . Patient called and notified that [name] was called and she has picked up the paperwork for [employer].  It has been sent for processing. . She should be hearing back about the [facility] card and [facility] financial sometime this week. Marland Kitchen Spoke with the patient and notified her that I was notified that she was apporved 100% through [employer] program. . She has the orange card and still waiting on the [facility] financial. . She is working with her case Insurance underwriter for  [employer]  . I will follow up with her in 3 weeks   Patient Self Care Activities:  . Patient verbalizes understanding of plan  . Attends all scheduled provider appointments . Calls provider office for new concerns or questions . Unable to independently self navigate DME services for oxygen  Please see past updates related to this goal by clicking on the "Past Updates" button in the selected goal          The care management team will reach out to the patient again over the next 21 days.  The patient has been provided with contact information for the care management team and has been advised to call with any health related questions or concerns.   [name] RN, BSN, [facility] Care Management Coordinator [place] Phone: [phone] Fax: [phone]

## 2019-07-13 ENCOUNTER — Ambulatory Visit (HOSPITAL_COMMUNITY): Payer: Self-pay | Attending: Internal Medicine

## 2019-07-13 ENCOUNTER — Other Ambulatory Visit: Payer: Self-pay

## 2019-07-13 DIAGNOSIS — I2729 Other secondary pulmonary hypertension: Secondary | ICD-10-CM | POA: Insufficient documentation

## 2019-07-20 DIAGNOSIS — G4733 Obstructive sleep apnea (adult) (pediatric): Secondary | ICD-10-CM

## 2019-07-20 NOTE — Procedures (Signed)
Patient Name: Joy [name], Pilling Date: 07/10/2019 Gender: Female D.O.B: 1960/03/24 Age (years): 58 Referring Provider: Noemi Chapel DO Height (inches): 46 Interpreting Physician: Kara Mead MD, ABSM Weight (lbs): 162 RPSGT: Earney [place] BMI: 26 MRN: [id number] Neck Size: 14.00 <br> <br> CLINICAL INFORMATION Sleep Study Type: NPSG   Indication for sleep study: pulmonary hypertension, polycythemia  Epworth Sleepiness Score: 8    SLEEP STUDY TECHNIQUE As per the AASM Manual for the Scoring of Sleep and Associated Events v2.3 (April 2016) with a hypopnea requiring 4% desaturations.  The channels recorded and monitored were frontal, central and occipital EEG, electrooculogram (EOG), submentalis EMG (chin), nasal and oral airflow, thoracic and abdominal wall motion, anterior tibialis EMG, snore microphone, electrocardiogram, and pulse oximetry.  MEDICATIONS Medications self-administered by patient taken the night of the study : N/A  SLEEP ARCHITECTURE The study was initiated at 10:05:36 PM and ended at 5:05:44 AM.  Sleep onset time was 24.4 minutes and the sleep efficiency was 77.0%%. The total sleep time was 323.7 minutes.  Stage REM latency was 150.0 minutes.  The patient spent 1.4%% of the night in stage N1 sleep, 88.4%% in stage N2 sleep, 2.0%% in stage N3 and 8.2% in REM.  Alpha intrusion was absent.  Supine sleep was 57.30%.  RESPIRATORY PARAMETERS The overall apnea/hypopnea index (AHI) was 1.3 per hour. There were 2 total apneas, including 1 obstructive, 1 central and 0 mixed apneas. There were 5 hypopneas and 4 RERAs.  The AHI during Stage REM sleep was 2.3 per hour.  AHI while supine was 0.0 per hour.  The mean oxygen saturation was 93.5%. The minimum SpO2 during sleep was 68.0%. 2L O2 was added during the stdy  moderate snoring was noted during this study.  CARDIAC DATA The 2 lead EKG demonstrated sinus rhythm. The mean heart rate was 60.4 beats per  minute. Other EKG findings include: None.   LEG MOVEMENT DATA The total PLMS were 0 with a resulting PLMS index of 0.0. Associated arousal with leg movement index was 4.6 .  IMPRESSIONS - No significant obstructive sleep apnea occurred during this study (AHI = 1.3/h). - No significant central sleep apnea occurred during this study (CAI = 0.2/h). - Severe oxygen desaturation was noted during this study (Min O2 = 68.0%).2L O2 was added - The patient snored with moderate snoring volume. - No cardiac abnormalities were noted during this study. - Clinically significant periodic limb movements did not occur during sleep. No significant associated arousals.   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 - Sleep hygiene should be reviewed to assess factors that may improve sleep quality. - Weight management and regular exercise should be initiated or continued if appropriate.   Kara Mead MD Board Certified in [place]

## 2019-07-22 ENCOUNTER — Telehealth: Payer: Self-pay | Admitting: Critical Care Medicine

## 2019-07-22 NOTE — Telephone Encounter (Signed)
Please let Joy [name] know that she does not have sleep apnea but she does desaturate quite a bit and needs to use 2L oxygen whenever she is sleeping. Thanks!  Julian Hy, DO 07/22/19 6:56 PM [facility]

## 2019-07-23 ENCOUNTER — Other Ambulatory Visit: Payer: Self-pay

## 2019-07-23 MED ORDER — ALBUTEROL SULFATE HFA 108 (90 BASE) MCG/ACT IN AERS: 2.0000 | INHALATION_SPRAY | RESPIRATORY_TRACT | 1 refills | Status: DC | PRN

## 2019-07-23 MED FILL — !VENTOLIN HFA INHALER: 108 (90 BAS | 16 days supply | Qty: 18 | Fill #0

## 2019-07-23 NOTE — Progress Notes (Signed)
Albuterol sent to [facility] as requested. Nothing further needed at this time.

## 2019-07-23 NOTE — Telephone Encounter (Signed)
Called and spoke with patient about Dr. Anell Barr recs. Patient stated that she does already have oxygen at home on 2 liters. Let her know that Dr. Carlis Abbott wants her to wear it at night. Patient expressed understanding. Nothing further needed at this time. Patient will keep appt for upcoming PFT on 5/10.

## 2019-07-24 ENCOUNTER — Other Ambulatory Visit (HOSPITAL_COMMUNITY)
Admission: RE | Admit: 2019-07-24 | Discharge: 2019-07-24 | Disposition: A | Discharge status: Home / Self Care | Payer: HRSA Program | Source: Ambulatory Visit | Attending: Critical Care Medicine | Admitting: Critical Care Medicine

## 2019-07-24 ENCOUNTER — Ambulatory Visit: Payer: Self-pay

## 2019-07-24 ENCOUNTER — Other Ambulatory Visit: Payer: Self-pay

## 2019-07-24 DIAGNOSIS — Z20822 Contact with and (suspected) exposure to covid-19: Secondary | ICD-10-CM | POA: Diagnosis not present

## 2019-07-24 DIAGNOSIS — Z01812 Encounter for preprocedural laboratory examination: Secondary | ICD-10-CM | POA: Diagnosis present

## 2019-07-24 NOTE — Chronic Care Management (AMB) (Signed)
  Care Management   Outreach Note  07/24/2019 Name: Joy [name] MRN: [id number] DOB: 05/18/1960  Referred by: Shirley, [name], DO Reason for referral : Care Coordination (Care Management RNCM Insurance F/U)   An unsuccessful telephone outreach was attempted today. The patient was referred to the case management team for assistance with care management and care coordination.   Follow Up Plan: A HIPPA compliant phone message was left for the patient providing contact information and requesting a return call.  The care management team will reach out to the patient again over the next 7-14 days.   [name] RN, BSN, [facility] Care Management Coordinator [place] Phone: [phone] Fax: [phone]

## 2019-07-25 LAB — SARS CORONAVIRUS 2 (TAT 6-24 HRS): SARS Coronavirus 2: NEGATIVE

## 2019-07-27 ENCOUNTER — Ambulatory Visit (INDEPENDENT_AMBULATORY_CARE_PROVIDER_SITE_OTHER): Payer: Self-pay | Admitting: Critical Care Medicine

## 2019-07-27 ENCOUNTER — Other Ambulatory Visit: Payer: Self-pay

## 2019-07-27 ENCOUNTER — Encounter: Payer: Self-pay | Admitting: Critical Care Medicine

## 2019-07-27 VITALS — BP 108/52 | HR 83 | Ht 66.5 in | Wt 160.2 lb

## 2019-07-27 DIAGNOSIS — J439 Emphysema, unspecified: Secondary | ICD-10-CM

## 2019-07-27 DIAGNOSIS — I2729 Other secondary pulmonary hypertension: Secondary | ICD-10-CM

## 2019-07-27 DIAGNOSIS — J9611 Chronic respiratory failure with hypoxia: Secondary | ICD-10-CM

## 2019-07-27 DIAGNOSIS — J9612 Chronic respiratory failure with hypercapnia: Secondary | ICD-10-CM

## 2019-07-27 LAB — PULMONARY FUNCTION TEST
FEF 25-75 Post: 0.31 L/sec
FEF 25-75 Pre: 0.55 L/sec
FEF2575-%Change-Post: -42 %
FEF2575-%Pred-Post: 14 %
FEF2575-%Pred-Pre: 25 %
FEV1-%Change-Post: -12 %
FEV1-%Pred-Post: 42 %
FEV1-%Pred-Pre: 48 %
FEV1-Post: 0.94 L
FEV1-Pre: 1.07 L
FEV1FVC-%Change-Post: 0 %
FEV1FVC-%Pred-Pre: 66 %
FEV6-%Change-Post: -11 %
FEV6-%Pred-Post: 64 %
FEV6-%Pred-Pre: 72 %
FEV6-Post: 1.77 L
FEV6-Pre: 1.99 L
FEV6FVC-%Change-Post: 0 %
FEV6FVC-%Pred-Post: 102 %
FEV6FVC-%Pred-Pre: 101 %
FVC-%Change-Post: -11 %
FVC-%Pred-Post: 63 %
FVC-%Pred-Pre: 71 %
FVC-Post: 1.79 L
FVC-Pre: 2.03 L
Post FEV1/FVC ratio: 52 %
Post FEV6/FVC ratio: 99 %
Pre FEV1/FVC ratio: 53 %
Pre FEV6/FVC Ratio: 98 %

## 2019-07-27 MED ORDER — ALBUTEROL SULFATE HFA 108 (90 BASE) MCG/ACT IN AERS
2.0000 | INHALATION_SPRAY | RESPIRATORY_TRACT | 11 refills | Status: DC | PRN
Start: 2019-07-27 — End: 2020-06-28

## 2019-07-27 MED ORDER — ANORO ELLIPTA 62.5-25 MCG/INH IN AEPB: 1.0000 | INHALATION_SPRAY | Freq: Every morning | RESPIRATORY_TRACT | 11 refills | Status: DC

## 2019-07-27 NOTE — Progress Notes (Signed)
PFT completed today.  

## 2019-07-27 NOTE — Progress Notes (Signed)
Synopsis: Referred in April 2021 for COPD by Shirley, [name], DO.  Subjective:   PATIENT ID: Joy [name] GENDER: female DOB: 04-22-1960, MRN: [id number]  Chief Complaint  Patient presents with  . Follow-up    PFT follow up     Joy [name] is a 59 y/o woman with a history of chronic right heart failure and pulmonary hypertension for follow-up.  She completed her PFTs today.  She has not yet started Anoro because she has not picked this up at the pharmacy.  She does not have a rescue inhaler.  She shortness of breath when walking, but is able to walk 2 blocks from her house to the bus stop.  She feels that her breathing is at baseline no wheezing, sputum production.  Minimal coughing.  No dizziness or leg edema.  She completed her PSG that did not show sleep apnea but confirmed nocturnal desaturations.  She was started on 2 L supplemental oxygen at night, which she has been using.  She has been compliant with her diuretics.  She quit smoking in February 2021 after 40 years x 1 pack/day. Appt with cardiology on 6/23.    OV 06/25/19: Joy [name] is a 58 year old woman who presents for evaluation of pulmonary hypertension, chronic hypoxic respiratory failure, and likely COPD.  She was hospitalized in February 2021 for 4 days for decompensated right heart failure, hypoxic respiratory failure, AE COPD.  She was hospitalized in October 2020 for 3 days for the same.  At that time she was discharged on 2 L supplemental oxygen all the time.  She has since stopped using it during the day and only wears it at night per her primary care doctor's recommendations.  She was discharged on Anoro, but was only able to use the inhaler that she was discharged with and has not been able to fill it due to cost.  She uses her albuterol infrequently.  She quit smoking in February after 40 years x 1 pack/day.  Her dyspnea on exertion is significantly improved since having her edema better controlled.  She can walk more than  10 minutes without stopping and thinks she could walk a mile.  She is not sure if she could take a flight of stairs or not.  She denies coughing, sputum production, or wheezing since she was in the hospital in February.  She currently has no leg edema, abdominal distention like she had prior to the hospital.  She sometimes has chest pain she attributes to heartburn.  She had a right heart catheterization done in the fall 2020 demonstrating pulmonary hypertension, presumed to be WHO group III.  She has been recommended by cardiology to have a sleep study performed.  She admits to snoring, but denies history of witnessed apneas.  She wakes refreshed and does not have daytime fatigue.  She has no personal or family history of DVT.  HIV tests have been negative.  No history of IV drug use, use of diet pills, or amphetamines.  No history of vaping.  There is no family history of heart or lung disease. No known personal or family history of sarcoidosis.  No known history of liver disease.  She is up-to-date on her Covid and flu vaccines.     Past Medical History:  Diagnosis Date  . Acute congestive heart failure ([place])   . Acute dyspnea   . Acute on chronic diastolic CHF (congestive heart failure) ([place]) 08/11/2015  . Acute right heart failure ([place]) 08/11/2015  .  Bilateral lower extremity edema   . CHF (congestive heart failure) ([place])   . COPD (chronic obstructive pulmonary disease) ([place]) 08/10/2015   Dx on XRAY 08/08/15.  Needs PFTs   . GASTROESOPHAGEAL REFLUX, NO ESOPHAGITIS 05/16/2006   Qualifier: Diagnosis of  By: Drucie Ip    . GERD (gastroesophageal reflux disease)   . Hypertension   . Hypervolemia   . Leg swelling hospitalized 08/08/2015   BLE  . PELVIC MASS 07/05/2008   Qualifier: Diagnosis of  By: McDiarmid MD, Sherren Mocha    . Pulmonary hypertension ([place])   . Right heart failure ([place]) 08/10/2015  . Tobacco abuse 05/16/2006   Qualifier: Diagnosis of  By: Drucie Ip       Family History    Problem Relation Age of Onset  . Aneurysm Mother   . Hypertension Brother      Past Surgical History:  Procedure Laterality Date  . RIGHT/LEFT HEART CATH AND CORONARY ANGIOGRAPHY N/A 12/26/2018   Procedure: RIGHT/LEFT HEART CATH AND CORONARY ANGIOGRAPHY;  Surgeon: Jolaine Artist, MD;  Location: [facility];  Service: Cardiovascular;  Laterality: N/A;  . TUBAL LIGATION      Social History   Socioeconomic History  . Marital status: Married    Spouse name: Not on file  . Number of children: Not on file  . Years of education: Not on file  . Highest education level: Not on file  Occupational History  . Not on file  Tobacco Use  . Smoking status: Former Smoker    Packs/day: 1.00    Years: 36.00    Pack years: 36.00    Types: Cigarettes    Quit date: 04/20/2019    Years since quitting: 0.2  . Smokeless tobacco: Former Systems developer    Types: Chew  . Tobacco comment: "quit chewing in my early '20s"  Substance and Sexual Activity  . Alcohol use: Yes    Alcohol/week: 23.0 standard drinks    Types: 12 Cans of beer, 11 Shots of liquor per week  . Drug use: Yes    Types: Marijuana    Comment: 08/08/2015 "I smoke marijuana when I have it; probably a couple times/week"  . Sexual activity: Yes  Other Topics Concern  . Not on file  Social History Narrative  . Not on file   Social Determinants of Health   Financial Resource Strain: High Risk  . Difficulty of Paying Living Expenses: Hard  Food Insecurity: Food Insecurity Present  . Worried About Charity fundraiser in the Last Year: Never true  . Ran Out of Food in the Last Year: Sometimes true  Transportation Needs: Unmet Transportation Needs  . Lack of Transportation (Medical): Yes  . Lack of Transportation (Non-Medical): Yes  Physical Activity: Inactive  . Days of Exercise per Week: 0 days  . Minutes of Exercise per Session: 0 min  Stress: No Stress Concern Present  . Feeling of Stress : Not at all  Social Connections:    . Frequency of Communication with Friends and Family:   . Frequency of Social Gatherings with Friends and Family:   . Attends Religious Services:   . Active Member of Clubs or Organizations:   . Attends Archivist Meetings:   Marland Kitchen Marital Status:   Intimate Partner Violence:   . Fear of Current or Ex-Partner:   . Emotionally Abused:   Marland Kitchen Physically Abused:   . Sexually Abused:      No Known Allergies   Immunization History  Administered Date(s) Administered  . Influenza,inj,Quad PF,6+ Mos 03/06/2018, 12/27/2018  . PFIZER SARS-COV-2 Vaccination 06/04/2019, 06/25/2019  . PPD Test 11/04/2015  . Pneumococcal Polysaccharide-23 08/09/2015, 03/06/2018  . Td 01/17/2005    Outpatient Medications Prior to Visit  Medication Sig Dispense Refill  . acetaZOLAMIDE (DIAMOX) 250 MG tablet Take 250 mg by mouth daily.    Marland Kitchen albuterol (VENTOLIN HFA) 108 (90 Base) MCG/ACT inhaler Inhale 2-4 puffs into the lungs every 4 (four) hours as needed for wheezing (or cough). 8.5 g 1  . Multiple Vitamin (MULTIVITAMIN WITH MINERALS) TABS tablet Take 1 tablet by mouth daily.    Marland Kitchen spironolactone (ALDACTONE) 25 MG tablet TAKE 1 TABLET BY MOUTH EVERY DAY 30 tablet 2  . torsemide (DEMADEX) 20 MG tablet TAKE 1 TABLET BY MOUTH EVERY DAY 30 tablet 2  . umeclidinium-vilanterol (ANORO ELLIPTA) 62.5-25 MCG/INH AEPB Inhale 1 puff into the lungs daily. 60 each 5   No facility-administered medications prior to visit.    Review of Systems  Constitutional: Negative for chills, fever and weight loss.       Hot flashes  Respiratory: Positive for shortness of breath. Negative for cough, sputum production and wheezing.   Cardiovascular: Negative for chest pain and leg swelling.  Gastrointestinal: Negative for heartburn, nausea and vomiting.  Musculoskeletal: Negative for joint pain and myalgias.  Skin: Negative for rash.  Endo/Heme/Allergies: Negative for environmental allergies.     Objective:   Vitals:    07/27/19 1603  BP: (!) 108/52  Pulse: 83  SpO2: 100%  Weight: 160 lb 3.2 oz (72.7 kg)  Height: 5' 6.5" (1.689 m)   100% on   RA BMI Readings from Last 3 Encounters:  07/27/19 25.47 kg/m  07/10/19 25.76 kg/m  06/25/19 25.76 kg/m   Wt Readings from Last 3 Encounters:  07/27/19 160 lb 3.2 oz (72.7 kg)  07/10/19 162 lb (73.5 kg)  06/25/19 162 lb (73.5 kg)    Physical Exam Vitals reviewed.  Constitutional:      General: She is not in acute distress.    Appearance: She is not ill-appearing.  HENT:     Head: Normocephalic and atraumatic.  Eyes:     General: No scleral icterus. Cardiovascular:     Rate and Rhythm: Normal rate and regular rhythm.     Comments: Prominent P2 Pulmonary:     Comments: Breathing comfortably on room air, no conversational dyspnea.  Bibasilar rales, otherwise clear to auscultation bilaterally. Abdominal:     Palpations: Abdomen is soft.     Tenderness: There is no abdominal tenderness.  Musculoskeletal:        General: No swelling or deformity.     Cervical back: Neck supple.  Lymphadenopathy:     Cervical: No cervical adenopathy.  Skin:    General: Skin is warm and dry.     Findings: No rash.  Neurological:     Mental Status: She is alert.     Coordination: Coordination normal.     Comments: Delayed responses to questions.  No focal motor deficits.  Psychiatric:        Mood and Affect: Mood normal.        Behavior: Behavior normal.      CBC    Component Value Date/Time   WBC 5.0 05/04/2019 0844   RBC 6.49 (H) 05/04/2019 0844   HGB 16.6 (H) 05/04/2019 0844   HCT 57.1 (H) 05/04/2019 0844   HCT 54.3 (H) 05/02/2019 0546   PLT 241 05/04/2019 0844  MCV 88.0 05/04/2019 0844   MCH 25.6 (L) 05/04/2019 0844   MCHC 29.1 (L) 05/04/2019 0844   RDW 17.1 (H) 05/04/2019 0844   LYMPHSABS 1.7 04/30/2019 1022   MONOABS 0.6 04/30/2019 1022   EOSABS 0.0 04/30/2019 1022   BASOSABS 0.1 04/30/2019 1022    CHEMISTRY No results for input(s): NA,  K, CL, CO2, GLUCOSE, BUN, CREATININE, CALCIUM, MG, PHOS in the last 168 hours. CrCl cannot be calculated (Patient's most recent lab result is older than the maximum 21 days allowed.).   Chest Imaging- films reviewed: CXR, 1 view 04/30/2019-cardiomegaly with loss of AP window.  Increased interstitial markings bilaterally.  CTA chest 08/10/2015-is a system, no significant emphysema.  Very large pulmonary artery.  No PE.  VQ scan- low probability for PE; asymmetric but matched perfusion and ventilation abnormalities  Pulmonary Functions Testing Results: PFT Results Latest Ref Rng & Units 07/27/2019  FVC-Pre L 2.03  FVC-Predicted Pre % 71  FVC-Post L 1.79  FVC-Predicted Post % 63  Pre FEV1/FVC % % 53  Post FEV1/FCV % % 52  FEV1-Pre L 1.07  FEV1-Predicted Pre % 48  FEV1-Post L 0.94   2021- severe obstruction, no significant BD reversibility. Test performed with difficulty.  Echocardiogram 05/02/2019: LVEF 60 to 65% with regional wall motion abnormalities.  Unable to evaluate diastolic function.  Enlarged RV and RA with atrial septal bowing and ventricular septal flattening.  Moderately reduced RV systolic function.  Trivial MR.  Dilated IVC with limited respiratory variability.  Heart Catheterization 12/26/2018:  LHC/RHC 12/26/2018 Ao = 105/66 (85) LV = 106/12 RA = 9 RV = 75/10 PA = 73/24 (45) PCW = 14 Fick cardiac output/index = 3.8/2.1 PVR = 8.0 WU Ao sat = 83% (RA - no sedation) PA sat = 54%, 56%  Arterial sats 78-83% on ABG (sats read 85-87% on pulse ox at the time)  ABG in cath lab (RA): 7.45/71/49/83% - no sedation     Assessment & Plan:     ICD-10-CM   1. Chronic respiratory failure with hypoxia and hypercapnia (HCC)  J96.11    J96.12   2. Other secondary pulmonary hypertension (HCC)  I27.29   3. Pulmonary emphysema, unspecified emphysema type ([place])  J43.9     COPD- GOLD C. I would think her DOE is more her [place] than her COPD -Reeducated today on the importance  of inhaler compliance.  Represcribed [facility] and provided the address for her to get there. -Albuterol every 4 hours as needed as her rescue inhaler. Gradually determine ongoing tobacco cessation  Pulmonary hypertension- probably WHO group I; possibly WHO group III due to chronic lung disease.   Her high PVR is concerning and seems disproportionate to findings on her CT scan in the past. Chronic nocturnal hypoxia likely has been contributing.  She is currently very well compensated based on symptoms.  No evidence of CTEPH on VQ. No OSA.  -Reviewed PFTs, VQ scan, PSG results with her. -Continue supplemental oxygen at night. -Start Anoro once daily to optimize COPD, but I think this is likely a minimal contributor to her pulmonary hypertension. -Continue 2 L oxygen when sleeping. -Continue currently prescribed diuretic regimen. -Start (represcribed ) Anoro once daily.  We reviewed again the importance of compliance with her maintenance COPD regimen.  [facility]  She indicated that she could afford the $20 co-pay at a previous visit. -Up-to-date on Covid and flu vaccines. -She likely is a  candidate who would benefit from pulmonary vasodilators.  Will have her follow-up with cardiology as planned to determine if they agree.  History of tobacco abuse -Offered lung cancer screening; she wants to think about this for now  Chronic polycythemia; likely related to chronic respiratory failure -Continue to monitor  RTC in 2 months.    Current Outpatient Medications:  .  acetaZOLAMIDE (DIAMOX) 250 MG tablet, Take 250 mg by mouth daily., Disp: , Rfl:  .  albuterol (VENTOLIN HFA) 108 (90 Base) MCG/ACT inhaler, Inhale 2-4 puffs into the lungs every 4 (four) hours as needed for wheezing (or cough)., Disp: 8.5 g, Rfl: 1 .  Multiple Vitamin (MULTIVITAMIN WITH MINERALS) TABS tablet, Take 1 tablet by mouth daily., Disp: , Rfl:   .  spironolactone (ALDACTONE) 25 MG tablet, TAKE 1 TABLET BY MOUTH EVERY DAY, Disp: 30 tablet, Rfl: 2 .  torsemide (DEMADEX) 20 MG tablet, TAKE 1 TABLET BY MOUTH EVERY DAY, Disp: 30 tablet, Rfl: 2 .  umeclidinium-vilanterol (ANORO ELLIPTA) 62.5-25 MCG/INH AEPB, Inhale 1 puff into the lungs daily., Disp: 60 each, Rfl: Kemp Martez Weiand, DO [facility] 07/27/2019 4:27 PM

## 2019-07-27 NOTE — Patient Instructions (Addendum)
Thank you for visiting Dr. Carlis Abbott at [facility]. We recommend the following:   Meds ordered this encounter  Medications  . umeclidinium-vilanterol (ANORO ELLIPTA) 62.5-25 MCG/INH AEPB    Sig: Inhale 1 puff into the lungs in the morning.    Dispense:  1 each    Refill:  11  . albuterol (VENTOLIN HFA) 108 (90 Base) MCG/ACT inhaler    Sig: Inhale 2 puffs into the lungs every 4 (four) hours as needed for wheezing or shortness of breath.    Dispense:  18 g    Refill:  11    Return in about 2 months (around 09/26/2019).   Keep your Appointment with Cardiology on 6/23/ at 11:20 AM.   Please do your part to reduce the spread of COVID-19.

## 2019-07-30 ENCOUNTER — Telehealth: Payer: Self-pay | Admitting: Critical Care Medicine

## 2019-07-30 NOTE — Telephone Encounter (Signed)
Pt's OV notes have been placed in mail at address we have on file for pt. Nothing further needed.

## 2019-08-07 ENCOUNTER — Other Ambulatory Visit: Payer: Self-pay

## 2019-08-07 ENCOUNTER — Ambulatory Visit: Payer: Self-pay

## 2019-08-07 NOTE — Chronic Care Management (AMB) (Signed)
  Care Management   Outreach Note  08/07/2019 Name: Joy [name] MRN: [id number] DOB: Nov 03, 1960  Referred by: Shirley, [name], DO Reason for referral : Care Coordination ([place])   A second unsuccessful telephone outreach was attempted today. The patient was referred to the case management team for assistance with care management and care coordination.   Follow Up Plan: A HIPPA compliant phone message was left for the patient providing contact information and requesting a return call.  The care management team will reach out to the patient again over the next 7-14 days.   [name] RN, BSN, [facility] Care Management Coordinator [place] Phone: [phone] Fax: [phone]

## 2019-08-27 ENCOUNTER — Other Ambulatory Visit: Payer: Self-pay | Admitting: Family Medicine

## 2019-08-27 ENCOUNTER — Ambulatory Visit: Payer: Self-pay

## 2019-08-27 ENCOUNTER — Other Ambulatory Visit: Payer: Self-pay

## 2019-08-27 NOTE — Chronic Care Management (AMB) (Signed)
  Care Management   Outreach Note  08/27/2019 Name: Joy [name] MRN: [id number] DOB: 09-08-1960  Referred by: Shirley, [name], DO Reason for referral : Care Coordination ([place])   Third unsuccessful telephone outreach was attempted today. The patient was referred to the case management team for assistance with care management and care coordination. The patient's primary care provider has been notified of our unsuccessful attempts to make or maintain contact with the patient. The care management team is pleased to engage with this patient at any time in the future should he/she be interested in assistance from the care management team.   Follow Up Plan: No further follow up required:  The care management team is available to follow up with the patient after provider conversation with the patient regarding recommendation for care management engagement and subsequent re-referral to the care management team.   [name] RN, BSN, [facility] Care Management Coordinator [place] Phone: [phone] Fax: [phone]

## 2019-08-28 ENCOUNTER — Other Ambulatory Visit: Payer: Self-pay | Admitting: Family Medicine

## 2019-09-09 ENCOUNTER — Other Ambulatory Visit: Payer: Self-pay

## 2019-09-09 ENCOUNTER — Ambulatory Visit (HOSPITAL_COMMUNITY)
Admission: RE | Admit: 2019-09-09 | Discharge: 2019-09-09 | Disposition: A | Discharge status: Home / Self Care | Payer: Medicaid Other | Source: Ambulatory Visit | Attending: Internal Medicine | Admitting: Internal Medicine

## 2019-09-09 ENCOUNTER — Encounter (HOSPITAL_COMMUNITY): Payer: Self-pay | Admitting: Internal Medicine

## 2019-09-09 VITALS — BP 114/62 | HR 68 | Wt 163.0 lb

## 2019-09-09 DIAGNOSIS — Z87891 Personal history of nicotine dependence: Secondary | ICD-10-CM | POA: Insufficient documentation

## 2019-09-09 DIAGNOSIS — I272 Pulmonary hypertension, unspecified: Secondary | ICD-10-CM | POA: Diagnosis not present

## 2019-09-09 DIAGNOSIS — M79605 Pain in left leg: Secondary | ICD-10-CM | POA: Diagnosis not present

## 2019-09-09 DIAGNOSIS — J9611 Chronic respiratory failure with hypoxia: Secondary | ICD-10-CM

## 2019-09-09 DIAGNOSIS — I50812 Chronic right heart failure: Secondary | ICD-10-CM | POA: Insufficient documentation

## 2019-09-09 DIAGNOSIS — Z79899 Other long term (current) drug therapy: Secondary | ICD-10-CM | POA: Diagnosis not present

## 2019-09-09 DIAGNOSIS — K219 Gastro-esophageal reflux disease without esophagitis: Secondary | ICD-10-CM | POA: Diagnosis not present

## 2019-09-09 DIAGNOSIS — I5032 Chronic diastolic (congestive) heart failure: Secondary | ICD-10-CM | POA: Insufficient documentation

## 2019-09-09 DIAGNOSIS — J439 Emphysema, unspecified: Secondary | ICD-10-CM

## 2019-09-09 DIAGNOSIS — J449 Chronic obstructive pulmonary disease, unspecified: Secondary | ICD-10-CM | POA: Diagnosis not present

## 2019-09-09 DIAGNOSIS — Z8249 Family history of ischemic heart disease and other diseases of the circulatory system: Secondary | ICD-10-CM | POA: Diagnosis not present

## 2019-09-09 DIAGNOSIS — I110 Hypertensive heart disease with heart failure: Secondary | ICD-10-CM | POA: Diagnosis not present

## 2019-09-09 DIAGNOSIS — R911 Solitary pulmonary nodule: Secondary | ICD-10-CM | POA: Insufficient documentation

## 2019-09-09 DIAGNOSIS — I2729 Other secondary pulmonary hypertension: Secondary | ICD-10-CM

## 2019-09-09 LAB — BASIC METABOLIC PANEL
Anion gap: 9 (ref 5–15)
BUN: 10 mg/dL (ref 6–20)
CO2: 26 mmol/L (ref 22–32)
Calcium: 9.4 mg/dL (ref 8.9–10.3)
Chloride: 103 mmol/L (ref 98–111)
Creatinine, Ser: 0.77 mg/dL (ref 0.44–1.00)
GFR calc Af Amer: >60 mL/min (ref >60)
GFR calc non Af Amer: >60 mL/min (ref >60)
Glucose, Bld: 96 mg/dL (ref 70–99)
Potassium: 2.9 mmol/L — ABNORMAL LOW (ref 3.5–5.1)
Sodium: 138 mmol/L (ref 135–145)

## 2019-09-09 LAB — CBC
HCT: 41.8 % (ref 36.0–46.0)
Hemoglobin: 12.8 g/dL (ref 12.0–15.0)
MCH: 29.2 pg (ref 26.0–34.0)
MCHC: 30.6 g/dL (ref 30.0–36.0)
MCV: 95.2 fL (ref 80.0–100.0)
Platelets: 254 10*3/uL (ref 150–400)
RBC: 4.39 MIL/uL (ref 3.87–5.11)
RDW: 13.8 % (ref 11.5–15.5)
WBC: 7 10*3/uL (ref 4.0–10.5)
nRBC: 0 % (ref 0.0–0.2)

## 2019-09-09 MED FILL — ALBUTEROL SULFATE HFA 108 (: 108 (90 BAS | 16 days supply | Qty: 18 | Fill #1

## 2019-09-09 MED FILL — ANORO ELLIPTA 62.5-25 MCG I: 62.5-25 | 25 days supply | Qty: 60 | Fill #1

## 2019-09-09 NOTE — Progress Notes (Signed)
SATURATION QUALIFICATIONS: (This note is used to comply with regulatory documentation for home oxygen)  Patient Saturations on Room Air at Rest = 98%  Patient Saturations on Room Air while Ambulating = 81%  Patient Saturations on 2 Liters of oxygen while Ambulating = 97%  Please briefly explain why patient needs home oxygen:patient o2 stats dropped while ambulating in the office. When patient was placed on oxygen o2 stats increased

## 2019-09-09 NOTE — Progress Notes (Signed)
ADVANCED HF CLINIC NOTE  PCP: Dr [name]  Primary HF Cardiologist: Dr Haroldine Laws  HPI: Ms Brutus is a 24 y/oAAfemale smoker w/ COPD, pulmonary nodule (4 mm LLL by CT in 2015 and 2017), chronic diastolic HF, RV failure, pulmonary HTN, essential HTN, and smoker.   Last admitted back in October 2020 with acute hypoxic respiratory failure secondary to COPD and A/C diastolic heart failure. Diuresed with IV lasix. Had RHC/LHC wiith normal coronaries and moderate PAH. PH suspected to be from chronic lung disease. Recommendations for pulmonary follow up and outpatient sleep study and to continue torsemide 20 mg daily. She was not discharged on diuretics.   Admitted 04/30/19 with marked volume overload. Diuresed with IV lasix and transitioned to torsemide 40 mg daily, sprio 25 mg daily, and diamox 250 mg daily.  Overall diuresed 33 pounds. Also discharged on 2 liters [place] oxygen. Discharge weight 163 pounds.   Today she returns for HF follow up. Feels good. No SOB, orthopnea or PND. Mild LE edema. No syncope or presyncope. Has some LLE pain when walking - not clear claudication. She says exertional leg pain limits her the most   She saw Dr. Carlis Abbott who felt that her [place] was out of proportion to her COPD (despite FEV1 1/07). Anoro inhaler started. Now wearing O2 at night  Stopped smoking in 2/21  PFTs 5/21 FEV1 1.07 (48%) FVC 2.03 (71%)   Sleep study 4/21 AHI 2.3  Echo 12/2018-EF 70-75% RV severely enlarged with moderately reduced RV function.   RHC/LHC 12/26/18 Normal coronaries LVEF 60-65% RA 9 PA 73/24 (45) PCWP 14 Fick CO-CI = 3.8/2.1 PVR 8.0    ROS: All systems negative except as listed in HPI, PMH and Problem List.  SH:  Social History   Socioeconomic History  . Marital status: Married    Spouse name: Not on file  . Number of children: Not on file  . Years of education: Not on file  . Highest education level: Not on file  Occupational History  . Not on file   Tobacco Use  . Smoking status: Former Smoker    Packs/day: 1.00    Years: 36.00    Pack years: 36.00    Types: Cigarettes    Quit date: 04/20/2019    Years since quitting: 0.3  . Smokeless tobacco: Former Systems developer    Types: Chew  . Tobacco comment: "quit chewing in my early '20s"  Vaping Use  . Vaping Use: Never used  Substance and Sexual Activity  . Alcohol use: Yes    Alcohol/week: 23.0 standard drinks    Types: 12 Cans of beer, 11 Shots of liquor per week  . Drug use: Yes    Types: Marijuana    Comment: 08/08/2015 "I smoke marijuana when I have it; probably a couple times/week"  . Sexual activity: Yes  Other Topics Concern  . Not on file  Social History Narrative  . Not on file   Social Determinants of Health   Financial Resource Strain: High Risk  . Difficulty of Paying Living Expenses: Hard  Food Insecurity: Food Insecurity Present  . Worried About Charity fundraiser in the Last Year: Never true  . Ran Out of Food in the Last Year: Sometimes true  Transportation Needs: Unmet Transportation Needs  . Lack of Transportation (Medical): Yes  . Lack of Transportation (Non-Medical): Yes  Physical Activity: Inactive  . Days of Exercise per Week: 0 days  . Minutes of Exercise per Session: 0 min  Stress: No Stress Concern Present  . Feeling of Stress : Not at all  Social Connections:   . Frequency of Communication with Friends and Family:   . Frequency of Social Gatherings with Friends and Family:   . Attends Religious Services:   . Active Member of Clubs or Organizations:   . Attends Archivist Meetings:   Marland Kitchen Marital Status:   Intimate Partner Violence:   . Fear of Current or Ex-Partner:   . Emotionally Abused:   Marland Kitchen Physically Abused:   . Sexually Abused:     FH:  Family History  Problem Relation Age of Onset  . Aneurysm Mother   . Hypertension Brother     Past Medical History:  Diagnosis Date  . Acute congestive heart failure ([place])   . Acute dyspnea    . Acute on chronic diastolic CHF (congestive heart failure) ([place]) 08/11/2015  . Acute right heart failure ([place]) 08/11/2015  . Bilateral lower extremity edema   . CHF (congestive heart failure) ([place])   . COPD (chronic obstructive pulmonary disease) ([place]) 08/10/2015   Dx on XRAY 08/08/15.  Needs PFTs   . GASTROESOPHAGEAL REFLUX, NO ESOPHAGITIS 05/16/2006   Qualifier: Diagnosis of  By: Drucie Ip    . GERD (gastroesophageal reflux disease)   . Hypertension   . Hypervolemia   . Leg swelling hospitalized 08/08/2015   BLE  . PELVIC MASS 07/05/2008   Qualifier: Diagnosis of  By: McDiarmid MD, Sherren Mocha    . Pulmonary hypertension ([place])   . Right heart failure ([place]) 08/10/2015  . Tobacco abuse 05/16/2006   Qualifier: Diagnosis of  By: Drucie Ip      Current Outpatient Medications  Medication Sig Dispense Refill  . acetaZOLAMIDE (DIAMOX) 250 MG tablet TAKE 1 TABLET BY MOUTH EVERY DAY 30 tablet 5  . albuterol (VENTOLIN HFA) 108 (90 Base) MCG/ACT inhaler Inhale 2 puffs into the lungs every 4 (four) hours as needed for wheezing or shortness of breath. 18 g 11  . Multiple Vitamin (MULTIVITAMIN WITH MINERALS) TABS tablet Take 1 tablet by mouth daily.    Marland Kitchen spironolactone (ALDACTONE) 25 MG tablet TAKE 1 TABLET BY MOUTH EVERY DAY 30 tablet 2  . torsemide (DEMADEX) 20 MG tablet TAKE 1 TABLET BY MOUTH EVERY DAY 30 tablet 2  . umeclidinium-vilanterol (ANORO ELLIPTA) 62.5-25 MCG/INH AEPB Inhale 1 puff into the lungs in the morning. 1 each 11   No current facility-administered medications for this encounter.    Vitals:   09/09/19 1057  BP: 114/62  Pulse: 68  SpO2: 98%  Weight: 73.9 kg (163 lb)   Wt Readings from Last 3 Encounters:  09/09/19 73.9 kg (163 lb)  07/27/19 72.7 kg (160 lb 3.2 oz)  07/10/19 73.5 kg (162 lb)     PHYSICAL EXAM: General:  Well appearing. No resp difficulty HEENT: normal Neck: supple. no JVD. Carotids 2+ bilat; no bruits. No lymphadenopathy or thryomegaly  appreciated. Cor: PMI nondisplaced. Regular rate & rhythm. No rubs, gallops or murmurs. Lungs: clear decreased throughout Abdomen: soft, nontender, nondistended. No hepatosplenomegaly. No bruits or masses. Good bowel sounds. Extremities: no cyanosis, clubbing, rash, edema Neuro: alert & orientedx3, cranial nerves grossly intact. moves all 4 extremities w/o difficulty. Affect pleasant  - hall walk today sats dropped to 81%. Back up to 92% with 2L O2.  ASSESSMENT & PLAN:  1. Pulmonary HTN  - Had RHC 12/2018  RA 9, PA 73/24 (45) , PVR 8, CO 3.8, CI 2.1 . Moderate WHO Group  II PAH due to severe hypoxic lung disease. No role for pulmonary vasodilators.  - VQ and sleep study negative - PFTs with FEV1 1.07L  - I suspect this is mostly WHO Group III but may have component of WHO Group I - Now that she has stopped smoking and is on nighttime O2 will repeat RHC. If PA pressures and PVR remain markedly elevated could try Tyvaso  2. Chronic Hypoxic Respiratory Failure - Follows with Dr. Carlis Abbott in Pulmonary - PFTs reviewed - Continue 2 liters oxygen at night.  - hall walk today sats dropped to 81%. Back up to 92% with 2L O2. D/w Dr Carlis Abbott. Will need supplemental O2 with exertion - quit smoking 2/21  3. Chronic Diastolic HF---> R sided failure  - ECHO 04/2019 EF 60-65%.  RV moderately reduced. - Volume status ok. Repeating [place]  4. LLE exertional pain - suspicious for claudication - check ABIs   Glori Bickers MD 11:07 AM

## 2019-09-09 NOTE — H&P (View-Only) (Signed)
 ADVANCED HF CLINIC NOTE  PCP: Dr Jordan  Primary HF Cardiologist: Dr Nema Oatley  HPI: Joy [name] is a 58 y/oAAfemale smoker w/ COPD, pulmonary nodule (4 mm LLL by CT in 2015 and 2017), chronic diastolic HF, RV failure, pulmonary HTN, essential HTN, and smoker.   Last admitted back in October 2020 with acute hypoxic respiratory failure secondary to COPD and A/C diastolic heart failure. Diuresed with IV lasix. Had RHC/LHC wiith normal coronaries and moderate PAH. PH suspected to be from chronic lung disease. Recommendations for pulmonary follow up and outpatient sleep study and to continue torsemide 20 mg daily. She was not discharged on diuretics.   Admitted 04/30/19 with marked volume overload. Diuresed with IV lasix and transitioned to torsemide 40 mg daily, sprio 25 mg daily, and diamox 250 mg daily.  Overall diuresed 33 pounds. Also discharged on 2 liters [place] oxygen. Discharge weight 163 pounds.   Today she returns for HF follow up. Feels good. No SOB, orthopnea or PND. Mild LE edema. No syncope or presyncope. Has some LLE pain when walking - not clear claudication. She says exertional leg pain limits her the most   She saw Dr. Clark who felt that her PAH was out of proportion to her COPD (despite FEV1 1/07). Anoro inhaler started. Now wearing O2 at night  Stopped smoking in 2/21  PFTs 5/21 FEV1 1.07 (48%) FVC 2.03 (71%)   Sleep study 4/21 AHI 2.3  Echo 12/2018-EF 70-75% RV severely enlarged with moderately reduced RV function.   RHC/LHC 12/26/18 Normal coronaries LVEF 60-65% RA 9 PA 73/24 (45) PCWP 14 Fick CO-CI = 3.8/2.1 PVR 8.0    ROS: All systems negative except as listed in HPI, PMH and Problem List.  SH:  Social History   Socioeconomic History  . Marital status: Married    Spouse name: Not on file  . Number of children: Not on file  . Years of education: Not on file  . Highest education level: Not on file  Occupational History  . Not on file   Tobacco Use  . Smoking status: Former Smoker    Packs/day: 1.00    Years: 36.00    Pack years: 36.00    Types: Cigarettes    Quit date: 04/20/2019    Years since quitting: 0.3  . Smokeless tobacco: Former User    Types: Chew  . Tobacco comment: "quit chewing in my early '20s"  Vaping Use  . Vaping Use: Never used  Substance and Sexual Activity  . Alcohol use: Yes    Alcohol/week: 23.0 standard drinks    Types: 12 Cans of beer, 11 Shots of liquor per week  . Drug use: Yes    Types: Marijuana    Comment: 08/08/2015 "I smoke marijuana when I have it; probably a couple times/week"  . Sexual activity: Yes  Other Topics Concern  . Not on file  Social History Narrative  . Not on file   Social Determinants of Health   Financial Resource Strain: High Risk  . Difficulty of Paying Living Expenses: Hard  Food Insecurity: Food Insecurity Present  . Worried About Running Out of Food in the Last Year: Never true  . Ran Out of Food in the Last Year: Sometimes true  Transportation Needs: Unmet Transportation Needs  . Lack of Transportation (Medical): Yes  . Lack of Transportation (Non-Medical): Yes  Physical Activity: Inactive  . Days of Exercise per Week: 0 days  . Minutes of Exercise per Session: 0 min    Stress: No Stress Concern Present  . Feeling of Stress : Not at all  Social Connections:   . Frequency of Communication with Friends and Family:   . Frequency of Social Gatherings with Friends and Family:   . Attends Religious Services:   . Active Member of Clubs or Organizations:   . Attends Club or Organization Meetings:   . Marital Status:   Intimate Partner Violence:   . Fear of Current or Ex-Partner:   . Emotionally Abused:   . Physically Abused:   . Sexually Abused:     FH:  Family History  Problem Relation Age of Onset  . Aneurysm Mother   . Hypertension Brother     Past Medical History:  Diagnosis Date  . Acute congestive heart failure (H[facility])   . Acute dyspnea    . Acute on chronic diastolic CHF (congestive heart failure) ([facility]) 08/11/2015  . Acute right heart failure ([facility]) 08/11/2015  . Bilateral lower extremity edema   . CHF (congestive heart failure) (H[facility])   . COPD (chronic obstructive pulmonary disease) (H[facility]) 08/10/2015   Dx on XRAY 08/08/15.  Needs PFTs   . GASTROESOPHAGEAL REFLUX, NO ESOPHAGITIS 05/16/2006   Qualifier: Diagnosis of  By: Kivett, Whitney    . GERD (gastroesophageal reflux disease)   . Hypertension   . Hypervolemia   . Leg swelling hospitalized 08/08/2015   BLE  . PELVIC MASS 07/05/2008   Qualifier: Diagnosis of  By: McDiarmid MD, Todd    . Pulmonary hypertension (HCC)   . Right heart failure (H[facility]) 08/10/2015  . Tobacco abuse 05/16/2006   Qualifier: Diagnosis of  By: Kivett, Whitney      Current Outpatient Medications  Medication Sig Dispense Refill  . acetaZOLAMIDE (DIAMOX) 250 MG tablet TAKE 1 TABLET BY MOUTH EVERY DAY 30 tablet 5  . albuterol (VENTOLIN HFA) 108 (90 Base) MCG/ACT inhaler Inhale 2 puffs into the lungs every 4 (four) hours as needed for wheezing or shortness of breath. 18 g 11  . Multiple Vitamin (MULTIVITAMIN WITH MINERALS) TABS tablet Take 1 tablet by mouth daily.    . spironolactone (ALDACTONE) 25 MG tablet TAKE 1 TABLET BY MOUTH EVERY DAY 30 tablet 2  . torsemide (DEMADEX) 20 MG tablet TAKE 1 TABLET BY MOUTH EVERY DAY 30 tablet 2  . umeclidinium-vilanterol (ANORO ELLIPTA) 62.5-25 MCG/INH AEPB Inhale 1 puff into the lungs in the morning. 1 each 11   No current facility-administered medications for this encounter.    Vitals:   09/09/19 1057  BP: 114/62  Pulse: 68  SpO2: 98%  Weight: 73.9 kg (163 lb)   Wt Readings from Last 3 Encounters:  09/09/19 73.9 kg (163 lb)  07/27/19 72.7 kg (160 lb 3.2 oz)  07/10/19 73.5 kg (162 lb)     PHYSICAL EXAM: General:  Well appearing. No resp difficulty HEENT: normal Neck: supple. no JVD. Carotids 2+ bilat; no bruits. No lymphadenopathy or thryomegaly  appreciated. Cor: PMI nondisplaced. Regular rate & rhythm. No rubs, gallops or murmurs. Lungs: clear decreased throughout Abdomen: soft, nontender, nondistended. No hepatosplenomegaly. No bruits or masses. Good bowel sounds. Extremities: no cyanosis, clubbing, rash, edema Neuro: alert & orientedx3, cranial nerves grossly intact. moves all 4 extremities w/o difficulty. Affect pleasant  - hall walk today sats dropped to 81%. Back up to 92% with 2L O2.  ASSESSMENT & PLAN:  1. Pulmonary HTN  - Had RHC 12/2018  RA 9, PA 73/24 (45) , PVR 8, CO 3.8, CI 2.1 . Moderate WHO Group   II PAH due to severe hypoxic lung disease. No role for pulmonary vasodilators.  - VQ and sleep study negative - PFTs with FEV1 1.07L  - I suspect this is mostly WHO Group III but may have component of WHO Group I - Now that she has stopped smoking and is on nighttime O2 will repeat RHC. If PA pressures and PVR remain markedly elevated could try Tyvaso  2. Chronic Hypoxic Respiratory Failure - Follows with Dr. Carlis Abbott in Pulmonary - PFTs reviewed - Continue 2 liters oxygen at night.  - hall walk today sats dropped to 81%. Back up to 92% with 2L O2. D/w Dr Carlis Abbott. Will need supplemental O2 with exertion - quit smoking 2/21  3. Chronic Diastolic HF---> R sided failure  - ECHO 04/2019 EF 60-65%.  RV moderately reduced. - Volume status ok. Repeating [place]  4. LLE exertional pain - suspicious for claudication - check ABIs   Glori Bickers MD 11:07 AM

## 2019-09-09 NOTE — Addendum Note (Signed)
Encounter addended by: Shonna Chock, CMA on: 6/00/4599 4:22 PM  Actions taken: Clinical Note Signed

## 2019-09-09 NOTE — Patient Instructions (Addendum)
Labs done today. We will contact you only if your labs are abnormal.  No medication changes were made. Please continue all current medications as prescribed.  Your physician recommends that you schedule a follow-up appointment in: 3 months.  If you have any questions or concerns before your next appointment please send [place] a message through [place] or call our office at [phone].    TO LEAVE A MESSAGE FOR THE NURSE SELECT OPTION 2, PLEASE LEAVE A MESSAGE INCLUDING: . YOUR NAME . DATE OF BIRTH . CALL BACK NUMBER . REASON FOR CALL**this is important as we prioritize the call backs  [place] AS LONG AS YOU CALL BEFORE 4:00 PM   Your physician has requested that you have an ankle brachial index (ABI). During this test an ultrasound and blood pressure cuff are used to evaluate the arteries that supply the arms and legs with blood. Allow thirty minutes for this exam. There are no restrictions or special instructions. This has to be approved prior to scheduling. Our office will contact you once approved through your insurance company.     You are scheduled for a Cardiac Catheterization on Wednesday, June 30 with Dr. Glori Bickers.  1. Please arrive at the [facility] (Main Entrance A) at [facility]: [address] [place], [place] [address] at 12:00 PM (This time is two hours before your procedure to ensure your preparation). Free valet parking service is available.   Special note: Every effort is made to have your procedure done on time. Please understand that emergencies sometimes delay scheduled procedures.  2. Diet: Do not eat solid foods after midnight.  The patient may have clear liquids until 5am upon the day of the procedure.   3. Medication instructions in preparation for your procedure:  The morning of your procedure DO NOT TAKE YOUR TORSEMIDE OR SPIRONOLACTONE  On the morning of your procedure any morning medicines NOT listed  above.  You may use sips of water.  5. Plan for one night stay--bring personal belongings. 6. Bring a current list of your medications and current insurance cards. 7. You MUST have a responsible person to drive you home. 8. Someone MUST be with you the first 24 hours after you arrive home or your discharge will be delayed. 9. Please wear clothes that are easy to get on and off and wear slip-on shoes.   At the [facility], you and your health needs are our priority. As part of our continuing mission to provide you with exceptional heart care, we have created designated Provider Care Teams. These Care Teams include your primary Cardiologist (physician) and Advanced Practice Providers (APPs- Physician Assistants and Nurse Practitioners) who all work together to provide you with the care you need, when you need it.   You may see any of the following providers on your designated Care Team at your next follow up: Marland Kitchen Dr Glori Bickers . Dr Loralie Champagne . Darrick Grinder, NP . Lyda Jester, PA . Audry Riles, PharmD   Please be sure to bring in all your medications bottles to every appointment.

## 2019-09-09 NOTE — Addendum Note (Signed)
Encounter addended by: Shonna Chock, CMA on: 1/44/3154 1:02 PM  Actions taken: Order list changed, Diagnosis association updated, Pend clinical note

## 2019-09-09 NOTE — Addendum Note (Signed)
Addended by: Lia Foyer R on: 09/09/2019 02:36 PM   Modules accepted: Orders

## 2019-09-10 ENCOUNTER — Other Ambulatory Visit (HOSPITAL_COMMUNITY): Payer: Self-pay | Admitting: *Deleted

## 2019-09-10 DIAGNOSIS — I272 Pulmonary hypertension, unspecified: Secondary | ICD-10-CM

## 2019-09-10 DIAGNOSIS — I50812 Chronic right heart failure: Secondary | ICD-10-CM

## 2019-09-10 MED ORDER — SODIUM CHLORIDE 0.9% FLUSH: 3.0000 mL | Freq: Two times a day (BID) | INTRAVENOUS | Status: DC

## 2019-09-11 ENCOUNTER — Telehealth (HOSPITAL_COMMUNITY): Payer: Self-pay | Admitting: *Deleted

## 2019-09-11 DIAGNOSIS — I50812 Chronic right heart failure: Secondary | ICD-10-CM

## 2019-09-11 MED ORDER — POTASSIUM CHLORIDE CRYS ER 20 MEQ PO TBCR
EXTENDED_RELEASE_TABLET | ORAL | 3 refills | Status: DC
Start: 2019-09-11 — End: 2020-11-17

## 2019-09-11 NOTE — Telephone Encounter (Signed)
-----  Message from Jolaine Artist, MD sent at 09/10/2019  4:58 PM EDT ----- Start K 40 daily. Have her take 165mq on the first day only. Repeat BMET next week.

## 2019-09-11 NOTE — Telephone Encounter (Signed)
Unable to reach pt, left VM that new RX for potassium was being sent into pharmacy, call our office back on Mon to let us know she got medication and sch f/u labs

## 2019-09-16 ENCOUNTER — Other Ambulatory Visit: Payer: Self-pay

## 2019-09-16 ENCOUNTER — Ambulatory Visit (HOSPITAL_COMMUNITY)
Admission: RE | Admit: 2019-09-16 | Discharge: 2019-09-16 | Disposition: A | Discharge status: Home / Self Care | Payer: Medicaid Other | Attending: Internal Medicine | Admitting: Internal Medicine

## 2019-09-16 ENCOUNTER — Encounter (HOSPITAL_COMMUNITY)
Admission: RE | Disposition: A | Discharge status: Home / Self Care | Payer: Self-pay | Source: Home / Self Care | Attending: Internal Medicine

## 2019-09-16 DIAGNOSIS — I2721 Secondary pulmonary arterial hypertension: Secondary | ICD-10-CM | POA: Diagnosis not present

## 2019-09-16 DIAGNOSIS — I110 Hypertensive heart disease with heart failure: Secondary | ICD-10-CM | POA: Diagnosis not present

## 2019-09-16 DIAGNOSIS — J9611 Chronic respiratory failure with hypoxia: Secondary | ICD-10-CM | POA: Diagnosis not present

## 2019-09-16 DIAGNOSIS — K219 Gastro-esophageal reflux disease without esophagitis: Secondary | ICD-10-CM | POA: Diagnosis not present

## 2019-09-16 DIAGNOSIS — I272 Pulmonary hypertension, unspecified: Secondary | ICD-10-CM

## 2019-09-16 DIAGNOSIS — I50812 Chronic right heart failure: Secondary | ICD-10-CM

## 2019-09-16 DIAGNOSIS — Z87891 Personal history of nicotine dependence: Secondary | ICD-10-CM | POA: Diagnosis not present

## 2019-09-16 DIAGNOSIS — I5033 Acute on chronic diastolic (congestive) heart failure: Secondary | ICD-10-CM | POA: Diagnosis not present

## 2019-09-16 DIAGNOSIS — Z79899 Other long term (current) drug therapy: Secondary | ICD-10-CM | POA: Insufficient documentation

## 2019-09-16 DIAGNOSIS — M79662 Pain in left lower leg: Secondary | ICD-10-CM | POA: Diagnosis not present

## 2019-09-16 DIAGNOSIS — J449 Chronic obstructive pulmonary disease, unspecified: Secondary | ICD-10-CM | POA: Insufficient documentation

## 2019-09-16 HISTORY — PX: RIGHT HEART CATH: CATH118263

## 2019-09-16 LAB — POCT I-STAT EG7
Acid-Base Excess: 1 mmol/L (ref 0.0–2.0)
Acid-Base Excess: 1 mmol/L (ref 0.0–2.0)
Acid-Base Excess: 1 mmol/L (ref 0.0–2.0)
Acid-Base Excess: 2 mmol/L (ref 0.0–2.0)
Bicarbonate: 27.6 mmol/L (ref 20.0–28.0)
Bicarbonate: 27.8 mmol/L (ref 20.0–28.0)
Bicarbonate: 28.7 mmol/L — ABNORMAL HIGH (ref 20.0–28.0)
Bicarbonate: 29.4 mmol/L — ABNORMAL HIGH (ref 20.0–28.0)
Calcium, Ion: 1.24 mmol/L (ref 1.15–1.40)
Calcium, Ion: 1.3 mmol/L (ref 1.15–1.40)
Calcium, Ion: 1.36 mmol/L (ref 1.15–1.40)
Calcium, Ion: 1.36 mmol/L (ref 1.15–1.40)
HCT: 40 % (ref 36.0–46.0)
HCT: 40 % (ref 36.0–46.0)
HCT: 41 % (ref 36.0–46.0)
HCT: 41 % (ref 36.0–46.0)
Hemoglobin: 13.6 g/dL (ref 12.0–15.0)
Hemoglobin: 13.6 g/dL (ref 12.0–15.0)
Hemoglobin: 13.9 g/dL (ref 12.0–15.0)
Hemoglobin: 13.9 g/dL (ref 12.0–15.0)
O2 Saturation: 74 %
O2 Saturation: 75 %
O2 Saturation: 77 %
O2 Saturation: 70 %
Potassium: 4 mmol/L (ref 3.5–5.1)
Potassium: 4.1 mmol/L (ref 3.5–5.1)
Potassium: 4.3 mmol/L (ref 3.5–5.1)
Potassium: 4.4 mmol/L (ref 3.5–5.1)
Sodium: 138 mmol/L (ref 135–145)
Sodium: 139 mmol/L (ref 135–145)
Sodium: 140 mmol/L (ref 135–145)
Sodium: 138 mmol/L (ref 135–145)
TCO2: 29 mmol/L (ref 22–32)
TCO2: 29 mmol/L (ref 22–32)
TCO2: 30 mmol/L (ref 22–32)
TCO2: 31 mmol/L (ref 22–32)
pCO2, Ven: 52.2 mmHg (ref 44.0–60.0)
pCO2, Ven: 52.9 mmHg (ref 44.0–60.0)
pCO2, Ven: 55.8 mmHg (ref 44.0–60.0)
pCO2, Ven: 57 mmHg (ref 44.0–60.0)
pH, Ven: 7.319 (ref 7.250–7.430)
pH, Ven: 7.325 (ref 7.250–7.430)
pH, Ven: 7.334 (ref 7.250–7.430)
pH, Ven: 7.321 (ref 7.250–7.430)
pO2, Ven: 43 mmHg (ref 32.0–45.0)
pO2, Ven: 44 mmHg (ref 32.0–45.0)
pO2, Ven: 46 mmHg — ABNORMAL HIGH (ref 32.0–45.0)
pO2, Ven: 40 mmHg (ref 32.0–45.0)

## 2019-09-16 LAB — POTASSIUM: Potassium: 4.5 mmol/L (ref 3.5–5.1)

## 2019-09-16 SURGERY — RIGHT HEART CATH
Anesthesia: LOCAL

## 2019-09-16 MED ORDER — ASPIRIN 81 MG PO CHEW: 81.0000 mg | CHEWABLE_TABLET | ORAL | Status: DC

## 2019-09-16 MED ORDER — MIDAZOLAM HCL 2 MG/2ML IJ SOLN
INTRAMUSCULAR | Status: AC
  Filled 2019-09-16: qty 2

## 2019-09-16 MED ORDER — LABETALOL HCL 5 MG/ML IV SOLN: 10.0000 mg | INTRAVENOUS | Status: DC | PRN

## 2019-09-16 MED ORDER — HEPARIN (PORCINE) IN NACL 1000-0.9 UT/500ML-% IV SOLN
INTRAVENOUS | Status: DC | PRN
  Administered 2019-09-16: 500 mL

## 2019-09-16 MED ORDER — HEPARIN (PORCINE) IN NACL 1000-0.9 UT/500ML-% IV SOLN
INTRAVENOUS | Status: AC
  Filled 2019-09-16: qty 500

## 2019-09-16 MED ORDER — ACETAMINOPHEN 325 MG PO TABS: 650.0000 mg | ORAL_TABLET | ORAL | Status: DC | PRN

## 2019-09-16 MED ORDER — HYDRALAZINE HCL 20 MG/ML IJ SOLN: 10.0000 mg | INTRAMUSCULAR | Status: DC | PRN

## 2019-09-16 MED ORDER — LIDOCAINE HCL (PF) 1 % IJ SOLN
INTRAMUSCULAR | Status: DC | PRN
  Administered 2019-09-16: 2 mL

## 2019-09-16 MED ORDER — LIDOCAINE HCL (PF) 1 % IJ SOLN
INTRAMUSCULAR | Status: AC
  Filled 2019-09-16: qty 30

## 2019-09-16 MED ORDER — MIDAZOLAM HCL 2 MG/2ML IJ SOLN
INTRAMUSCULAR | Status: DC | PRN
  Administered 2019-09-16: 1 mg via INTRAVENOUS

## 2019-09-16 MED ORDER — ONDANSETRON HCL 4 MG/2ML IJ SOLN: 4.0000 mg | Freq: Four times a day (QID) | INTRAMUSCULAR | Status: DC | PRN

## 2019-09-16 MED ORDER — SODIUM CHLORIDE 0.9 % IV SOLN: INTRAVENOUS | Status: DC

## 2019-09-16 MED ORDER — FENTANYL CITRATE (PF) 100 MCG/2ML IJ SOLN
INTRAMUSCULAR | Status: DC | PRN
  Administered 2019-09-16: 25 ug via INTRAVENOUS

## 2019-09-16 MED ORDER — FENTANYL CITRATE (PF) 100 MCG/2ML IJ SOLN
INTRAMUSCULAR | Status: AC
  Filled 2019-09-16: qty 2

## 2019-09-16 MED ORDER — SODIUM CHLORIDE 0.9% FLUSH: 3.0000 mL | INTRAVENOUS | Status: DC | PRN

## 2019-09-16 MED ORDER — SODIUM CHLORIDE 0.9% FLUSH: 3.0000 mL | Freq: Two times a day (BID) | INTRAVENOUS | Status: DC

## 2019-09-16 MED ORDER — SODIUM CHLORIDE 0.9 % IV SOLN: 250.0000 mL | INTRAVENOUS | Status: DC | PRN

## 2019-09-16 SURGICAL SUPPLY — 6 items
CATH BALLN WEDGE 5F 110CM (CATHETERS) ×1 IMPLANT
GUIDEWIRE .025 260CM (WIRE) ×1 IMPLANT
PACK CARDIAC CATHETERIZATION (CUSTOM PROCEDURE TRAY) ×2 IMPLANT
SHEATH GLIDE SLENDER 4/5FR (SHEATH) ×1 IMPLANT
TRANSDUCER W/STOPCOCK (MISCELLANEOUS) ×2 IMPLANT
WIRE EMERALD 3MM-J .025X260CM (WIRE) ×2 IMPLANT

## 2019-09-16 NOTE — Interval H&P Note (Signed)
History and Physical Interval Note:  09/16/2019 2:51 PM  Joy [name]  has presented today for surgery, with the diagnosis of pulm htn.  The various methods of treatment have been discussed with the patient and family. After consideration of risks, benefits and other options for treatment, the patient has consented to  Procedure(s): RIGHT HEART CATH (N/A) as a surgical intervention.  The patient's history has been reviewed, patient examined, no change in status, stable for surgery.  I have reviewed the patient's chart and labs.  Questions were answered to the patient's satisfaction.     Lakashia Collison

## 2019-09-16 NOTE — Discharge Instructions (Signed)
Call Dr Bensimhon's office if any problems, questions, or concerns, call if any bleeding, drainage, fever, pain, swelling, or redness at right arm site

## 2019-09-16 NOTE — Research (Signed)
PHDE Informed Consent   Subject Name: Joy [name]  Subject met inclusion and exclusion criteria.  The informed consent form, study requirements and expectations were reviewed with the subject and questions and concerns were addressed prior to the signing of the consent form.  The subject verbalized understanding of the trail requirements.  The subject agreed to participate in the PHDE trial and signed the informed consent.  The informed consent was obtained prior to performance of any protocol-specific procedures for the subject.  A copy of the signed informed consent was given to the subject and a copy was placed in the subject's medical record.  Neva Seat 09/16/2019, 12:05 PM

## 2019-09-17 ENCOUNTER — Encounter (HOSPITAL_COMMUNITY): Payer: Self-pay | Admitting: Internal Medicine

## 2019-09-17 ENCOUNTER — Telehealth: Payer: Self-pay | Admitting: Critical Care Medicine

## 2019-09-17 NOTE — Telephone Encounter (Signed)
Received fax from [employer] that they reached out to patient to set up eval for OCD for O2 portanility. Was told that patient wants to cancel this order due to her not having any insurance right now. She stated she is waiting on disability and will let us know when she has insurance. Nothing further needed at this time.

## 2019-09-23 ENCOUNTER — Other Ambulatory Visit (HOSPITAL_COMMUNITY): Payer: Self-pay | Admitting: Internal Medicine

## 2019-09-23 DIAGNOSIS — I739 Peripheral vascular disease, unspecified: Secondary | ICD-10-CM

## 2019-09-25 ENCOUNTER — Ambulatory Visit (HOSPITAL_COMMUNITY)
Admission: RE | Admit: 2019-09-25 | Discharge: 2019-09-25 | Disposition: A | Discharge status: Home / Self Care | Payer: Medicaid Other | Source: Ambulatory Visit | Attending: Cardiovascular Disease | Admitting: Cardiovascular Disease

## 2019-09-25 ENCOUNTER — Other Ambulatory Visit: Payer: Self-pay

## 2019-09-25 DIAGNOSIS — I739 Peripheral vascular disease, unspecified: Secondary | ICD-10-CM | POA: Diagnosis not present

## 2019-09-29 ENCOUNTER — Ambulatory Visit (INDEPENDENT_AMBULATORY_CARE_PROVIDER_SITE_OTHER): Payer: Medicaid Other | Admitting: Critical Care Medicine

## 2019-09-29 ENCOUNTER — Other Ambulatory Visit: Payer: Self-pay

## 2019-09-29 ENCOUNTER — Encounter: Payer: Self-pay | Admitting: Critical Care Medicine

## 2019-09-29 VITALS — BP 112/62 | HR 88 | Temp 98.4°F | Ht 66.0 in | Wt 157.6 lb

## 2019-09-29 DIAGNOSIS — J439 Emphysema, unspecified: Secondary | ICD-10-CM | POA: Diagnosis not present

## 2019-09-29 DIAGNOSIS — J9612 Chronic respiratory failure with hypercapnia: Secondary | ICD-10-CM

## 2019-09-29 DIAGNOSIS — J9611 Chronic respiratory failure with hypoxia: Secondary | ICD-10-CM

## 2019-09-29 DIAGNOSIS — Z23 Encounter for immunization: Secondary | ICD-10-CM | POA: Diagnosis not present

## 2019-09-29 DIAGNOSIS — I50812 Chronic right heart failure: Secondary | ICD-10-CM | POA: Diagnosis not present

## 2019-09-29 DIAGNOSIS — I272 Pulmonary hypertension, unspecified: Secondary | ICD-10-CM | POA: Diagnosis not present

## 2019-09-29 NOTE — Progress Notes (Signed)
Synopsis: Referred in April 2021 for COPD by Joy Clan, DO.  Subjective:   PATIENT ID: Joy [name] GENDER: female DOB: 10-Jul-1960, MRN: [id number]  Chief Complaint  Patient presents with  . Follow-up    Patient feels the same since her last visit. No issues or concerns.     Joy [name] is a 59 year old woman with a history of COPD, pulmonary hypertension, chronic hypoxic respiratory failure who presents for follow-up.  She continues on Anoro once daily and uses her rescue inhaler about twice daily with benefit her symptoms.  She has shortness of breath with activity, especially when she is walking to hang close on her close lying outside.  Her symptoms improved with rest.  She can walk about 1 block before stopping.  No chest pain, dizziness, or syncopal episodes.  No significant swelling.  She discontinued smoking in February 2021 after previously smoking 0.5 packs/day.  At her recent cardiology visit she had desaturations to 81% in her office.  She was tested on her finger rather than her forehead (has fake nails on with dark nail polish today).  She uses 2 L supplemental oxygen when sleeping, but was not able to get portable tanks delivered to her house due to insurance issues.  She does not use oxygen when walking around her house and denies dyspnea with usual daily activities.  Cardiology follow-up visit on 6/23 note reviewed.  Recent right heart catheterization with improved pressures compared to previous (PVR 8--> 4 WU, mean PAP 45--> 64m Hg).  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    OV 07/27/19: [name] a 59y/o woman with a history of chronic right heart failure and pulmonary hypertension for follow-up.  She completed her PFTs today.  She has not yet started Anoro because she has not picked this up at the pharmacy.  She does not have a rescue inhaler.  She shortness of breath when walking, but is able to walk 2 blocks from her  house to the bus stop.  She feels that her breathing is at baseline no wheezing, sputum production.  Minimal coughing.  No dizziness or leg edema.  She completed her PSG that did not show sleep apnea but confirmed nocturnal desaturations.  She was started on 2 L supplemental oxygen at night, which she has been using.  She has been compliant with her diuretics.  She quit smoking in February 2021 after 40 years x 1 pack/day. Appt with cardiology on 6/23.   OV 06/25/19: [name] a 59year old woman who presents for evaluation of pulmonary hypertension, chronic hypoxic respiratory failure, and likely COPD.  She was hospitalized in February 2021 for 4 days for decompensated right heart failure, hypoxic respiratory failure, AE COPD.  She was hospitalized in October 2020 for 3 days for the same.  At that time she was discharged on 2 L supplemental oxygen all the time.  She has since stopped using it during the day and only wears it at night per her primary care doctor's recommendations.  She was discharged on Anoro, but was only able to use the inhaler that she was discharged with and has not been able to fill it due to cost.  She uses her albuterol infrequently.  She quit smoking in February after 40 years x 1 pack/day.  Her dyspnea on exertion is significantly improved since having her edema better controlled.  She can walk more than 10 minutes without stopping and  thinks she could walk a mile.  She is not sure if she could take a flight of stairs or not.  She denies coughing, sputum production, or wheezing since she was in the hospital in February.  She currently has no leg edema, abdominal distention like she had prior to the hospital.  She sometimes has chest pain she attributes to heartburn.  She had a right heart catheterization done in the fall 2020 demonstrating pulmonary hypertension, presumed to be WHO group III.  She has been recommended by cardiology to have a sleep study performed.  She admits to  snoring, but denies history of witnessed apneas.  She wakes refreshed and does not have daytime fatigue.  She has no personal or family history of DVT.  HIV tests have been negative.  No history of IV drug use, use of diet pills, or amphetamines.  No history of vaping.  There is no family history of heart or lung disease. No known personal or family history of sarcoidosis.  No known history of liver disease.  She is up-to-date on her Covid and flu vaccines.     Past Medical History:  Diagnosis Date  . Acute congestive heart failure ([place])   . Acute dyspnea   . Acute on chronic diastolic CHF (congestive heart failure) ([place]) 08/11/2015  . Acute right heart failure ([place]) 08/11/2015  . Bilateral lower extremity edema   . CHF (congestive heart failure) ([place])   . COPD (chronic obstructive pulmonary disease) ([place]) 08/10/2015   Dx on XRAY 08/08/15.  Needs PFTs   . GASTROESOPHAGEAL REFLUX, NO ESOPHAGITIS 05/16/2006   Qualifier: Diagnosis of  By: Drucie Ip    . GERD (gastroesophageal reflux disease)   . Hypertension   . Hypervolemia   . Leg swelling hospitalized 08/08/2015   BLE  . PELVIC MASS 07/05/2008   Qualifier: Diagnosis of  By: McDiarmid MD, Sherren Mocha    . Pulmonary hypertension ([place])   . Right heart failure ([place]) 08/10/2015  . Tobacco abuse 05/16/2006   Qualifier: Diagnosis of  By: Drucie Ip       Family History  Problem Relation Age of Onset  . Aneurysm Mother   . Hypertension Brother      Past Surgical History:  Procedure Laterality Date  . RIGHT HEART CATH N/A 09/16/2019   Procedure: RIGHT HEART CATH;  Surgeon: Jolaine Artist, MD;  Location: [facility];  Service: Cardiovascular;  Laterality: N/A;  . RIGHT/LEFT HEART CATH AND CORONARY ANGIOGRAPHY N/A 12/26/2018   Procedure: RIGHT/LEFT HEART CATH AND CORONARY ANGIOGRAPHY;  Surgeon: Jolaine Artist, MD;  Location: [facility];  Service: Cardiovascular;  Laterality: N/A;  . TUBAL LIGATION      Social  History   Socioeconomic History  . Marital status: Married    Spouse name: Not on file  . Number of children: Not on file  . Years of education: Not on file  . Highest education level: Not on file  Occupational History  . Not on file  Tobacco Use  . Smoking status: Former Smoker    Packs/day: 1.00    Years: 36.00    Pack years: 36.00    Types: Cigarettes    Quit date: 04/20/2019    Years since quitting: 0.4  . Smokeless tobacco: Former Systems developer    Types: Chew  . Tobacco comment: "quit chewing in my early '20s"  Vaping Use  . Vaping Use: Never used  Substance and Sexual Activity  . Alcohol use: Yes  Alcohol/week: 23.0 standard drinks    Types: 12 Cans of beer, 11 Shots of liquor per week  . Drug use: Yes    Types: Marijuana    Comment: 08/08/2015 "I smoke marijuana when I have it; probably a couple times/week"  . Sexual activity: Yes  Other Topics Concern  . Not on file  Social History Narrative  . Not on file   Social Determinants of Health   Financial Resource Strain: High Risk  . Difficulty of Paying Living Expenses: Hard  Food Insecurity: Food Insecurity Present  . Worried About Charity fundraiser in the Last Year: Never true  . Ran Out of Food in the Last Year: Sometimes true  Transportation Needs: Unmet Transportation Needs  . Lack of Transportation (Medical): Yes  . Lack of Transportation (Non-Medical): Yes  Physical Activity: Inactive  . Days of Exercise per Week: 0 days  . Minutes of Exercise per Session: 0 min  Stress: No Stress Concern Present  . Feeling of Stress : Not at all  Social Connections:   . Frequency of Communication with Friends and Family:   . Frequency of Social Gatherings with Friends and Family:   . Attends Religious Services:   . Active Member of Clubs or Organizations:   . Attends Archivist Meetings:   Marland Kitchen Marital Status:   Intimate Partner Violence:   . Fear of Current or Ex-Partner:   . Emotionally Abused:   Marland Kitchen Physically  Abused:   . Sexually Abused:      No Known Allergies   Immunization History  Administered Date(s) Administered  . Influenza,inj,Quad PF,6+ Mos 03/06/2018, 12/27/2018  . PFIZER SARS-COV-2 Vaccination 06/04/2019, 06/25/2019  . PPD Test 11/04/2015  . Pneumococcal Conjugate-13 09/29/2019  . Pneumococcal Polysaccharide-23 08/09/2015, 03/06/2018  . Td 01/17/2005    Outpatient Medications Prior to Visit  Medication Sig Dispense Refill  . acetaZOLAMIDE (DIAMOX) 250 MG tablet TAKE 1 TABLET BY MOUTH EVERY DAY (Patient taking differently: Take 250 mg by mouth daily. ) 30 tablet 5  . albuterol (VENTOLIN HFA) 108 (90 Base) MCG/ACT inhaler Inhale 2 puffs into the lungs every 4 (four) hours as needed for wheezing or shortness of breath. 18 g 11  . Multiple Vitamin (MULTIVITAMIN WITH MINERALS) TABS tablet Take 1 tablet by mouth daily.    . potassium chloride SA (KLOR-CON) 20 MEQ tablet Take 6 tablets (120 mEq total) by mouth daily for 1 day, THEN 2 tablets (40 mEq total) daily. 66 tablet 3  . spironolactone (ALDACTONE) 25 MG tablet TAKE 1 TABLET BY MOUTH EVERY DAY (Patient taking differently: Take 25 mg by mouth daily. ) 30 tablet 2  . torsemide (DEMADEX) 20 MG tablet TAKE 1 TABLET BY MOUTH EVERY DAY (Patient taking differently: Take 20 mg by mouth daily. ) 30 tablet 2  . umeclidinium-vilanterol (ANORO ELLIPTA) 62.5-25 MCG/INH AEPB Inhale 1 puff into the lungs in the morning. (Patient taking differently: Inhale 1 puff into the lungs daily. ) 1 each 11   No facility-administered medications prior to visit.    Review of Systems  Constitutional: Negative for chills, fever and weight loss.       Hot flashes  Respiratory: Positive for shortness of breath. Negative for cough, sputum production and wheezing.   Cardiovascular: Negative for chest pain and leg swelling.  Gastrointestinal: Negative for heartburn, nausea and vomiting.  Musculoskeletal: Negative for joint pain and myalgias.  Skin: Negative  for rash.  Endo/Heme/Allergies: Negative for environmental allergies.     Objective:  Vitals:   09/29/19 1030  BP: 112/62  Pulse: 88  Temp: 98.4 F (36.9 C)  TempSrc: Oral  SpO2: 95%  Weight: 157 lb 9.6 oz (71.5 kg)  Height: _0  (1.676 m)   95% on   RA BMI Readings from Last 3 Encounters:  09/29/19 25.44 kg/m  09/16/19 26.31 kg/m  09/09/19 25.91 kg/m   Wt Readings from Last 3 Encounters:  09/29/19 157 lb 9.6 oz (71.5 kg)  09/16/19 163 lb (73.9 kg)  09/09/19 163 lb (73.9 kg)    Physical Exam Vitals reviewed.  Constitutional:      General: She is not in acute distress.    Appearance: She is not ill-appearing.  HENT:     Head: Normocephalic and atraumatic.  Eyes:     General: No scleral icterus. Cardiovascular:     Rate and Rhythm: Normal rate and regular rhythm.     Heart sounds: No murmur heard.   Pulmonary:     Comments: CTAB, no conversational dyspnea Abdominal:     General: There is no distension.     Palpations: Abdomen is soft.     Tenderness: There is no abdominal tenderness.  Musculoskeletal:        General: No swelling or deformity.     Cervical back: Neck supple.  Lymphadenopathy:     Cervical: No cervical adenopathy.  Skin:    General: Skin is warm and dry.     Findings: No rash.  Neurological:     General: No focal deficit present.     Mental Status: She is alert.     Coordination: Coordination normal.  Psychiatric:        Mood and Affect: Mood normal.        Behavior: Behavior normal.      CBC    Component Value Date/Time   WBC 7.0 09/09/2019 1132   RBC 4.39 09/09/2019 1132   HGB 13.9 09/16/2019 1536   HCT 41.0 09/16/2019 1536   HCT 54.3 (H) 05/02/2019 0546   PLT 254 09/09/2019 1132   MCV 95.2 09/09/2019 1132   MCH 29.2 09/09/2019 1132   MCHC 30.6 09/09/2019 1132   RDW 13.8 09/09/2019 1132   LYMPHSABS 1.7 04/30/2019 1022   MONOABS 0.6 04/30/2019 1022   EOSABS 0.0 04/30/2019 1022   BASOSABS 0.1 04/30/2019 1022     CHEMISTRY No results for input(s): NA, K, CL, CO2, GLUCOSE, BUN, CREATININE, CALCIUM, MG, PHOS in the last 168 hours. Estimated Creatinine Clearance: 75.8 mL/min (by C-G formula based on SCr of 0.77 mg/dL).   Chest Imaging- films reviewed: CXR, 1 view 04/30/2019-cardiomegaly with loss of AP window.  Increased interstitial markings bilaterally.  CTA chest 08/10/2015-is a system, no significant emphysema.  Very large pulmonary artery.  No PE.  VQ scan- low probability for PE; asymmetric but matched perfusion and ventilation abnormalities  Pulmonary Functions Testing Results: PFT Results Latest Ref Rng & Units 07/27/2019  FVC-Pre L 2.03  FVC-Predicted Pre % 71  FVC-Post L 1.79  FVC-Predicted Post % 63  Pre FEV1/FVC % % 53  Post FEV1/FCV % % 52  FEV1-Pre L 1.07  FEV1-Predicted Pre % 48  FEV1-Post L 0.94   2021- severe obstruction, no significant BD reversibility. Test performed with difficulty.  Echocardiogram 05/02/2019: LVEF 60 to 65% with regional wall motion abnormalities.  Unable to evaluate diastolic function.  Enlarged RV and RA with atrial septal bowing and ventricular septal flattening.  Moderately reduced RV systolic function.  Trivial MR.  Dilated IVC  with limited respiratory variability.  Heart Catheterization 12/26/2018:  LHC/RHC 12/26/2018 Ao = 105/66 (85) LV = 106/12 RA = 9 RV = 75/10 PA = 73/24 (45) PCW = 14 Fick cardiac output/index = 3.8/2.1 PVR = 8.0 WU Ao sat = 83% (RA - no sedation) PA sat = 54%, 56%  Arterial sats 78-83% on ABG (sats read 85-87% on pulse ox at the time)  ABG in cath lab (RA): 7.45/71/49/83% - no sedation   RHC 09/16/19: RA = 2 RV = 56/6 PA = 55/20 (33) PCW = 9 Fick cardiac output/index = 5.6/3.1 PVR = 4.0 WU Ao sat = 95% PA sat = 73%, 77% High SVC sat = 70% Assessment: 1. Moderate PAH  2. Normal left-sided pressures with normal output 3. No evidence of intra-cardiac shunting.  Plan/Discussion: PA pressures improved  since previous. Continue medical therapy.   Assessment & Plan:     ICD-10-CM   1. Chronic respiratory failure with hypoxia and hypercapnia (HCC)  J96.11    J96.12   2. Pulmonary emphysema, unspecified emphysema type ([place])  J43.9   3. Pulmonary hypertension (HCC)  I27.20   4. Chronic right-sided heart failure (HCC)  I50.812     COPD- GOLD C -Continue Anoro once daily.  Reiterated the importance of compliance.  She has done well with this recently. -Continue albuterol every 4 hours as needed as her rescue inhaler. -Reinforced the importance of total smoking cessation -Walked in the office today-- didn'Joy drop below 95%.  Does not currently need supplemental oxygen during the day.  Chronic hypoxic respiratory failure requiring nocturnal oxygen -Continue 2 L supplemental oxygen at night for sleeping.  Pulmonary hypertension- WHO group III. She is currently very well compensated based on symptoms and had significant improvement in her pulmonary hemodynamics since beginning treatment.  No evidence of CTEPH on VQ. No OSA.  -Not a candidate for pulmonary vasodilators. -Continue supplemental oxygen at night. -Continue Anoro once daily -Continue currently prescribed diuretic regimen. -Up-to-date on Covid and flu vaccines.  Prevnar 13 today.  History of tobacco abuse -Offered lung cancer screening again today; she wants to think about this for now.  Chronic polycythemia; likely related to chronic respiratory failure-improved  RTC in 3 months.    Current Outpatient Medications:  .  acetaZOLAMIDE (DIAMOX) 250 MG tablet, TAKE 1 TABLET BY MOUTH EVERY DAY (Patient taking differently: Take 250 mg by mouth daily. ), Disp: 30 tablet, Rfl: 5 .  albuterol (VENTOLIN HFA) 108 (90 Base) MCG/ACT inhaler, Inhale 2 puffs into the lungs every 4 (four) hours as needed for wheezing or shortness of breath., Disp: 18 g, Rfl: 11 .  Multiple Vitamin (MULTIVITAMIN WITH MINERALS) TABS tablet, Take 1 tablet by  mouth daily., Disp: , Rfl:  .  potassium chloride SA (KLOR-CON) 20 MEQ tablet, Take 6 tablets (120 mEq total) by mouth daily for 1 day, THEN 2 tablets (40 mEq total) daily., Disp: 66 tablet, Rfl: 3 .  spironolactone (ALDACTONE) 25 MG tablet, TAKE 1 TABLET BY MOUTH EVERY DAY (Patient taking differently: Take 25 mg by mouth daily. ), Disp: 30 tablet, Rfl: 2 .  torsemide (DEMADEX) 20 MG tablet, TAKE 1 TABLET BY MOUTH EVERY DAY (Patient taking differently: Take 20 mg by mouth daily. ), Disp: 30 tablet, Rfl: 2 .  umeclidinium-vilanterol (ANORO ELLIPTA) 62.5-25 MCG/INH AEPB, Inhale 1 puff into the lungs in the morning. (Patient taking differently: Inhale 1 puff into the lungs daily. ), Disp: 1 each, Rfl: 11     Venita Sheffield  Carlis Abbott, DO [facility] 09/29/2019 12:01 PM

## 2019-09-29 NOTE — Patient Instructions (Addendum)
Thank you for visiting Dr. Carlis Abbott at [facility]. We recommend the following:  Keep taking Anoro once daily in the morning and albuterol as needed. Keep using your oxygen at night and any time you are sleeping. If your saturations are low during the day when walking around, you should use it any time you are moving around.  Return in about 3 months (around 12/30/2019).    Please do your part to reduce the spread of COVID-19.

## 2019-12-09 NOTE — Progress Notes (Signed)
ADVANCED HF CLINIC NOTE  PCP: Dr [name]  Primary HF Cardiologist: Dr Haroldine Laws  HPI: Joy [name] is a 65 y/oAAfemale smoker w/ COPD, chronic diastolic HF, RV failure, pulmonary HTN, essential HTN.   Admitted October 2020 with acute hypoxic respiratory failure secondary to COPD and A/C diastolic heart failure. Diuresed. Had RHC/LHC wiith normal coronaries and moderate PAH. PH suspected to be from chronic lung disease. Recommendations for pulmonary follow up and outpatient sleep study.  Admitted 2/21 with marked volume overload. Overall diuresed 33 pounds. Also discharged on 2 liters [place] oxygen. Discharge weight 163 pounds.   She saw Dr. Carlis Abbott who felt that her [place] was out of proportion to her COPD (despite FEV1 1.07). Anoro inhaler started. Now wearing O2 at night  Stopped smoking in 2/21  We saw her in 6/21 and decided to repeat RHC to consider possible initiation of Tyvaso. PA pressures much improved with O2 and PVR down to 4.0  RHC 09/16/19 RA = 2 RV = 56/6 PA = 55/20 (33) PCW = 9 Fick cardiac output/index = 5.6/3.1 PVR = 4.0 WU Ao sat = 95% PA sat = 73%, 77% High SVC sat = 70%   Today she returns for HF follow up. Feels pretty good. Mild DOE but able to get around pretty good. Goes to store without any problem. No syncope or presyncope. Back to smoking but only 2 cigs/day. Wearing O2 at night. Occasionally wears it at night.    Studies:  PFTs 5/21 FEV1 1.07 (48%) FVC 2.03 (71%)  Sleep study 4/21 AHI 2.3  Echo 12/2018-EF 70-75% RV severely enlarged with moderately reduced RV function.   RHC/LHC 12/26/18 Normal coronaries LVEF 60-65% RA 9 PA 73/24 (45) PCWP 14 Fick CO-CI = 3.8/2.1 PVR 8.0  ROS: All systems negative except as listed in HPI, PMH and Problem List.  SH:  Social History   Socioeconomic History  . Marital status: Married    Spouse name: Not on file  . Number of children: Not on file  . Years of education: Not on file  . Highest  education level: Not on file  Occupational History  . Not on file  Tobacco Use  . Smoking status: Former Smoker    Packs/day: 1.00    Years: 36.00    Pack years: 36.00    Types: Cigarettes    Quit date: 04/20/2019    Years since quitting: 0.6  . Smokeless tobacco: Former Systems developer    Types: Chew  . Tobacco comment: "quit chewing in my early '20s"  Vaping Use  . Vaping Use: Never used  Substance and Sexual Activity  . Alcohol use: Yes    Alcohol/week: 23.0 standard drinks    Types: 12 Cans of beer, 11 Shots of liquor per week  . Drug use: Yes    Types: Marijuana    Comment: 08/08/2015 "I smoke marijuana when I have it; probably a couple times/week"  . Sexual activity: Yes  Other Topics Concern  . Not on file  Social History Narrative  . Not on file   Social Determinants of Health   Financial Resource Strain: High Risk  . Difficulty of Paying Living Expenses: Hard  Food Insecurity: Food Insecurity Present  . Worried About Charity fundraiser in the Last Year: Never true  . Ran Out of Food in the Last Year: Sometimes true  Transportation Needs: Unmet Transportation Needs  . Lack of Transportation (Medical): Yes  . Lack of Transportation (Non-Medical): Yes  Physical Activity: Inactive  .  Days of Exercise per Week: 0 days  . Minutes of Exercise per Session: 0 min  Stress: No Stress Concern Present  . Feeling of Stress : Not at all  Social Connections:   . Frequency of Communication with Friends and Family: Not on file  . Frequency of Social Gatherings with Friends and Family: Not on file  . Attends Religious Services: Not on file  . Active Member of Clubs or Organizations: Not on file  . Attends Archivist Meetings: Not on file  . Marital Status: Not on file  Intimate Partner Violence:   . Fear of Current or Ex-Partner: Not on file  . Emotionally Abused: Not on file  . Physically Abused: Not on file  . Sexually Abused: Not on file    FH:  Family History   Problem Relation Age of Onset  . Aneurysm Mother   . Hypertension Brother     Past Medical History:  Diagnosis Date  . Acute congestive heart failure ([place])   . Acute dyspnea   . Acute on chronic diastolic CHF (congestive heart failure) ([place]) 08/11/2015  . Acute right heart failure ([place]) 08/11/2015  . Bilateral lower extremity edema   . CHF (congestive heart failure) ([place])   . COPD (chronic obstructive pulmonary disease) ([place]) 08/10/2015   Dx on XRAY 08/08/15.  Needs PFTs   . GASTROESOPHAGEAL REFLUX, NO ESOPHAGITIS 05/16/2006   Qualifier: Diagnosis of  By: Drucie Ip    . GERD (gastroesophageal reflux disease)   . Hypertension   . Hypervolemia   . Leg swelling hospitalized 08/08/2015   BLE  . PELVIC MASS 07/05/2008   Qualifier: Diagnosis of  By: McDiarmid MD, Sherren Mocha    . Pulmonary hypertension ([place])   . Right heart failure ([place]) 08/10/2015  . Tobacco abuse 05/16/2006   Qualifier: Diagnosis of  By: Drucie Ip      Current Outpatient Medications  Medication Sig Dispense Refill  . acetaZOLAMIDE (DIAMOX) 250 MG tablet TAKE 1 TABLET BY MOUTH EVERY DAY (Patient taking differently: Take 250 mg by mouth daily. ) 30 tablet 5  . albuterol (VENTOLIN HFA) 108 (90 Base) MCG/ACT inhaler Inhale 2 puffs into the lungs every 4 (four) hours as needed for wheezing or shortness of breath. 18 g 11  . Multiple Vitamin (MULTIVITAMIN WITH MINERALS) TABS tablet Take 1 tablet by mouth daily.    . potassium chloride SA (KLOR-CON) 20 MEQ tablet Take 6 tablets (120 mEq total) by mouth daily for 1 day, THEN 2 tablets (40 mEq total) daily. 66 tablet 3  . spironolactone (ALDACTONE) 25 MG tablet TAKE 1 TABLET BY MOUTH EVERY DAY (Patient taking differently: Take 25 mg by mouth daily. ) 30 tablet 2  . torsemide (DEMADEX) 20 MG tablet TAKE 1 TABLET BY MOUTH EVERY DAY (Patient taking differently: Take 20 mg by mouth daily. ) 30 tablet 2  . umeclidinium-vilanterol (ANORO ELLIPTA) 62.5-25 MCG/INH AEPB Inhale 1 puff  into the lungs in the morning. (Patient taking differently: Inhale 1 puff into the lungs daily. ) 1 each 11   No current facility-administered medications for this encounter.    Vitals:   12/10/19 1014  BP: 114/62  Pulse: 72  SpO2: 96%  Weight: 74.3 kg (163 lb 12.8 oz)  Height: _0  (1.676 m)   Wt Readings from Last 3 Encounters:  09/29/19 71.5 kg (157 lb 9.6 oz)  09/16/19 73.9 kg (163 lb)  09/09/19 73.9 kg (163 lb)     PHYSICAL EXAM: General:  Well  appearing. No resp difficulty HEENT: normal Neck: supple. no JVD. Carotids 2+ bilat; no bruits. No lymphadenopathy or thryomegaly appreciated. Cor: PMI nondisplaced. Regular rate & rhythm. No rubs, gallops or murmurs. Lungs: clear but decreased throughout Abdomen: soft, nontender, nondistended. No hepatosplenomegaly. No bruits or masses. Good bowel sounds. Extremities: no cyanosis, clubbing, rash, edema Neuro: alert & orientedx3, cranial nerves grossly intact. moves all 4 extremities w/o difficulty. Affect pleasant   ASSESSMENT & PLAN:  1. Pulmonary HTN  - RHC 10/20  RA 9, PA 73/24 (45), PVR 8, CI 2.1 . - RHC after O2 started 6/21 RA 2 PA 55/20 (33) PCW 9 PVR 4.0 CI 3.1 - VQ and sleep study negative - PFTs with FEV1 1.07L  - I suspect this is mostly WHO Group III - Continue aggressive treatment of COPD. Would not add pulmonary vasodilators at this point based on recent RH  2. Chronic Hypoxic Respiratory Failure - Follows with Dr. Carlis Abbott in Pulmonary - PFTs reviewed - Continue 2 liters oxygen at night.  - hall walk 6/21 sats dropped to 81%. Back up to 92% with 2L O2. O2 stadrted - quit smoking 2/21 but now back to smoking 2 cigs/day -> encouraged cessation - suggested she get pulse ox to follow sats more closely and better guide her use of supplemental O2.   3. Chronic Diastolic HF---> R sided failure  - ECHO 04/2019 EF 60-65%.  RV moderately reduced. - Volume status ok on exam and recent [place]  4. LLE exertional pain -  suspicious for claudication - ABI 7/21 ok    Glori Bickers MD 10:15 PM

## 2019-12-10 ENCOUNTER — Other Ambulatory Visit: Payer: Self-pay

## 2019-12-10 ENCOUNTER — Encounter (HOSPITAL_COMMUNITY): Payer: Self-pay | Admitting: Internal Medicine

## 2019-12-10 ENCOUNTER — Ambulatory Visit (HOSPITAL_COMMUNITY)
Admission: RE | Admit: 2019-12-10 | Discharge: 2019-12-10 | Disposition: A | Discharge status: Home / Self Care | Payer: Medicaid Other | Source: Ambulatory Visit | Attending: Internal Medicine | Admitting: Internal Medicine

## 2019-12-10 VITALS — BP 114/62 | HR 72 | Ht 66.0 in | Wt 163.8 lb

## 2019-12-10 DIAGNOSIS — I739 Peripheral vascular disease, unspecified: Secondary | ICD-10-CM

## 2019-12-10 DIAGNOSIS — I272 Pulmonary hypertension, unspecified: Secondary | ICD-10-CM | POA: Diagnosis not present

## 2019-12-10 DIAGNOSIS — I110 Hypertensive heart disease with heart failure: Secondary | ICD-10-CM | POA: Diagnosis not present

## 2019-12-10 DIAGNOSIS — I50812 Chronic right heart failure: Secondary | ICD-10-CM | POA: Diagnosis not present

## 2019-12-10 DIAGNOSIS — F17210 Nicotine dependence, cigarettes, uncomplicated: Secondary | ICD-10-CM | POA: Diagnosis not present

## 2019-12-10 DIAGNOSIS — J9611 Chronic respiratory failure with hypoxia: Secondary | ICD-10-CM | POA: Insufficient documentation

## 2019-12-10 DIAGNOSIS — I5033 Acute on chronic diastolic (congestive) heart failure: Secondary | ICD-10-CM | POA: Insufficient documentation

## 2019-12-10 DIAGNOSIS — Z79899 Other long term (current) drug therapy: Secondary | ICD-10-CM | POA: Diagnosis not present

## 2019-12-10 DIAGNOSIS — J449 Chronic obstructive pulmonary disease, unspecified: Secondary | ICD-10-CM | POA: Diagnosis not present

## 2019-12-10 DIAGNOSIS — I5032 Chronic diastolic (congestive) heart failure: Secondary | ICD-10-CM

## 2019-12-10 DIAGNOSIS — K219 Gastro-esophageal reflux disease without esophagitis: Secondary | ICD-10-CM | POA: Insufficient documentation

## 2019-12-10 MED FILL — TORSEMIDE 20 MG TABLET: 20 | 30 days supply | Qty: 30 | Fill #0

## 2019-12-10 MED FILL — SPIRONOLACTONE 25 MG TABS: 25 | 34 days supply | Qty: 34 | Fill #0

## 2019-12-10 MED FILL — POTASSIUM CHLORIDE CRYS ER: 20 | 34 days supply | Qty: 68 | Fill #0

## 2019-12-10 NOTE — Patient Instructions (Signed)
HOMEWORK:   Stop Smoking  Get Pulse Oximeter and monitor your oxygen levels:  IF less than 90% WEAR YOUR OXYGEN    Please call our office in February 2022 to schedule your follow up appointment  If you have any questions or concerns before your next appointment please send [place] a message through [place] or call our office at [phone].    TO LEAVE A MESSAGE FOR THE NURSE SELECT OPTION 2, PLEASE LEAVE A MESSAGE INCLUDING:  YOUR NAME  DATE OF BIRTH  CALL BACK NUMBER  REASON FOR CALL**this is important as we prioritize the call backs  YOU WILL RECEIVE A CALL BACK THE SAME DAY AS LONG AS YOU CALL BEFORE 4:00 PM  At the [facility], you and your health needs are our priority. As part of our continuing mission to provide you with exceptional heart care, we have created designated Provider Care Teams. These Care Teams include your primary Cardiologist (physician) and Advanced Practice Providers (APPs- Physician Assistants and Nurse Practitioners) who all work together to provide you with the care you need, when you need it.   You may see any of the following providers on your designated Care Team at your next follow up:  Dr Glori Bickers  Dr Haynes Kerns, NP  Lyda Jester, [place]  Audry Riles, PharmD   Please be sure to bring in all your medications bottles to every appointment.

## 2019-12-10 NOTE — Progress Notes (Signed)
Pt requested to speak to CSW regarding Medicaid.  Pt states that [employer] worker from [place] helped her with Medicaid application a few months ago but she hasn't heard anything since.  CSW able to confirm that Janeece Fitting with [employer] assisted in submitting application for Medicaid but no unable to find any updates on case.  CSW called Janeece Fitting and left VM requesting return call to discuss and ensure that pt is doing everything she needs to.  Pt also confirms she is applying for disability- states she has appt with DDS MD next month.  CSW encouraged pt to reach out if she needed assistance- also informed her that she could call me with the DDS case manager information so I could assist in getting her clinic notes in a timely manner to help speed things along.  Will continue to follow through clinic and assist as needed  Jorge Ny, [facility] Desk#: [phone] Cell#: [phone]

## 2019-12-10 NOTE — Progress Notes (Signed)
RX for Geanie Kenning, and KCL all sent to [facility] under HF FUND as pt has no insurance at this time.

## 2019-12-14 ENCOUNTER — Other Ambulatory Visit: Payer: Self-pay

## 2019-12-14 MED ORDER — TORSEMIDE 20 MG PO TABS: 20.0000 mg | ORAL_TABLET | Freq: Every day | ORAL | 2 refills | Status: DC

## 2019-12-14 MED ORDER — SPIRONOLACTONE 25 MG PO TABS: 25.0000 mg | ORAL_TABLET | Freq: Every day | ORAL | 2 refills | Status: DC

## 2020-06-10 ENCOUNTER — Other Ambulatory Visit: Payer: Self-pay | Admitting: Family Medicine

## 2020-06-13 ENCOUNTER — Other Ambulatory Visit: Payer: Self-pay

## 2020-06-13 MED ORDER — ACETAZOLAMIDE 250 MG PO TABS: 250.0000 mg | ORAL_TABLET | Freq: Every day | ORAL | 0 refills | Status: DC

## 2020-06-15 NOTE — Progress Notes (Signed)
ADVANCED HF CLINIC NOTE  PCP: Dr [name]  Primary HF Cardiologist: Dr Haroldine Laws Pulmonologist: Dr. Carlis Abbott   HPI: [name] is a 69 y/oAAfemale smoker w/ COPD, chronic diastolic HF, RV failure, pulmonary HTN, essential HTN.   Admitted October 2020 with acute hypoxic respiratory failure secondary to COPD and A/C diastolic heart failure. Diuresed. Had RHC/LHC wiith normal coronaries and moderate PAH. PH suspected to be from chronic lung disease. Recommendations for pulmonary follow up and outpatient sleep study.  Admitted 2/21 with marked volume overload. Overall diuresed 33 pounds. Also discharged on 2 liters [place] oxygen. Discharge weight 163 pounds.   She saw Dr. Carlis Abbott who felt that her [place] was out of proportion to her COPD (despite FEV1 1.07). Anoro inhaler started. Now wearing O2 at night  Stopped smoking in 2/21  We saw her in 6/21 and decided to repeat RHC to consider possible initiation of Tyvaso. PA pressures much improved with O2 and PVR down to 4.0  RHC 09/16/19 RA = 2 RV = 56/6 PA = 55/20 (33) PCW = 9 Fick cardiac output/index = 5.6/3.1 PVR = 4.0 WU Ao sat = 95% PA sat = 73%, 77% High SVC sat = 70%  Today she returns for HF follow up. Feels pretty good. Denies SOB or CP. Just wears 2L O2 at night. No edema, orthopnea or PND Says her calves hurt when she stands or walks. Gets tingling at times.    Studies:  PFTs 5/21 FEV1 1.07 (48%) FVC 2.03 (71%)  Sleep study 4/21 AHI 2.3  Echo 12/2018-EF 70-75% RV severely enlarged with moderately reduced RV function.   RHC/LHC 12/26/18 Normal coronaries LVEF 60-65% RA 9 PA 73/24 (45) PCWP 14 Fick CO-CI = 3.8/2.1 PVR 8.0  ROS: All systems negative except as listed in HPI, PMH and Problem List.  SH:  Social History   Socioeconomic History  . Marital status: Married    Spouse name: Not on file  . Number of children: Not on file  . Years of education: Not on file  . Highest education level: Not on file   Occupational History  . Not on file  Tobacco Use  . Smoking status: Former Smoker    Packs/day: 1.00    Years: 36.00    Pack years: 36.00    Types: Cigarettes    Quit date: 04/20/2019    Years since quitting: 1.1  . Smokeless tobacco: Former Systems developer    Types: Chew  . Tobacco comment: "quit chewing in my early '20s"  Vaping Use  . Vaping Use: Never used  Substance and Sexual Activity  . Alcohol use: Yes    Alcohol/week: 23.0 standard drinks    Types: 12 Cans of beer, 11 Shots of liquor per week  . Drug use: Yes    Types: Marijuana    Comment: 08/08/2015 "I smoke marijuana when I have it; probably a couple times/week"  . Sexual activity: Yes  Other Topics Concern  . Not on file  Social History Narrative  . Not on file   Social Determinants of Health   Financial Resource Strain: Not on file  Food Insecurity: Not on file  Transportation Needs: Not on file  Physical Activity: Not on file  Stress: Not on file  Social Connections: Not on file  Intimate Partner Violence: Not on file    FH:  Family History  Problem Relation Age of Onset  . Aneurysm Mother   . Hypertension Brother     Past Medical History:  Diagnosis  Date  . Acute congestive heart failure ([place])   . Acute dyspnea   . Acute on chronic diastolic CHF (congestive heart failure) ([place]) 08/11/2015  . Acute right heart failure ([place]) 08/11/2015  . Bilateral lower extremity edema   . CHF (congestive heart failure) ([place])   . COPD (chronic obstructive pulmonary disease) ([place]) 08/10/2015   Dx on XRAY 08/08/15.  Needs PFTs   . GASTROESOPHAGEAL REFLUX, NO ESOPHAGITIS 05/16/2006   Qualifier: Diagnosis of  By: Drucie Ip    . GERD (gastroesophageal reflux disease)   . Hypertension   . Hypervolemia   . Leg swelling hospitalized 08/08/2015   BLE  . PELVIC MASS 07/05/2008   Qualifier: Diagnosis of  By: McDiarmid MD, Sherren Mocha    . Pulmonary hypertension ([place])   . Right heart failure ([place]) 08/10/2015  . Tobacco abuse 05/16/2006    Qualifier: Diagnosis of  By: Drucie Ip      Current Outpatient Medications  Medication Sig Dispense Refill  . acetaZOLAMIDE (DIAMOX) 250 MG tablet Take 1 tablet (250 mg total) by mouth daily. 30 tablet 0  . albuterol (VENTOLIN HFA) 108 (90 Base) MCG/ACT inhaler Inhale 2 puffs into the lungs every 4 (four) hours as needed for wheezing or shortness of breath. 18 g 11  . Multiple Vitamin (MULTIVITAMIN WITH MINERALS) TABS tablet Take 1 tablet by mouth daily.    . potassium chloride SA (KLOR-CON) 20 MEQ tablet Take 6 tablets (120 mEq total) by mouth daily for 1 day, THEN 2 tablets (40 mEq total) daily. (Patient not taking: Reported on 12/10/2019) 66 tablet 3  . spironolactone (ALDACTONE) 25 MG tablet TAKE 1 TABLET BY MOUTH EVERY DAY 30 tablet 2  . torsemide (DEMADEX) 20 MG tablet TAKE 1 TABLET BY MOUTH EVERY DAY 30 tablet 2  . umeclidinium-vilanterol (ANORO ELLIPTA) 62.5-25 MCG/INH AEPB Inhale 1 puff into the lungs in the morning. (Patient taking differently: Inhale 1 puff into the lungs daily. ) 1 each 11   No current facility-administered medications for this encounter.    There were no vitals filed for this visit. Wt Readings from Last 3 Encounters:  12/10/19 74.3 kg (163 lb 12.8 oz)  09/29/19 71.5 kg (157 lb 9.6 oz)  09/16/19 73.9 kg (163 lb)     PHYSICAL EXAM: General:  Well appearing. No resp difficulty HEENT: normal Neck: supple. no JVD. Carotids 2+ bilat; no bruits. No lymphadenopathy or thryomegaly appreciated. Cor: PMI nondisplaced. Regular rate & rhythm. No rubs, gallops or murmurs. Lungs: clear. Decreased throughout.  Abdomen: soft, nontender, nondistended. No hepatosplenomegaly. No bruits or masses. Good bowel sounds. Extremities: no cyanosis, clubbing, rash, edema Neuro: alert & orientedx3, cranial nerves grossly intact. moves all 4 extremities w/o difficulty. Affect pleasant   ASSESSMENT & PLAN:  1. Pulmonary HTN  - RHC 10/20  RA 9, PA 73/24 (45), PVR 8, CI 2.1  . - RHC after O2 started 6/21 RA 2 PA 55/20 (33) PCW 9 PVR 4.0 CI 3.1 - Echo 7/21 EF 60-65% RV moderately enlarged and mildly HK - VQ and sleep study negative - PFTs with FEV1 1.07L  - I suspect this is mostly WHO Group III - Continue aggressive treatment of COPD. Would not add pulmonary vasodilators at this point based on recent [place] - I again stressed the need to check her O2 sats with pulse ox when she walks (she has a device) and wear O2 as needed to keep sats >= 90% - Can f/u PRN   2. Chronic Hypoxic Respiratory Failure -  Follows with Dr. Carlis Abbott in Pulmonary - PFTs reviewed - Continue 2 liters oxygen at night.  - hall walk 6/21 sats dropped to 81%. Back up to 92% with 2L O2. Needs to wear O2 during the day.  - back to smoking several cigs/day. Discussed need for cessation - I again stressed the need to check her O2 sats with pulse ox when she walks (she has a device) .   3. Chronic Diastolic HF---> R sided failure  - ECHO 06/2019 EF 60-65%.  RV moderately reduced. - Volume status ok   4. LLE exertional pain - suspicious for claudication vs neuropathy - ABI 7/21 ok    Glori Bickers MD 10:44 PM

## 2020-06-16 ENCOUNTER — Other Ambulatory Visit: Payer: Self-pay

## 2020-06-16 ENCOUNTER — Encounter (HOSPITAL_COMMUNITY): Payer: Self-pay | Admitting: Internal Medicine

## 2020-06-16 ENCOUNTER — Ambulatory Visit (HOSPITAL_COMMUNITY)
Admission: RE | Admit: 2020-06-16 | Discharge: 2020-06-16 | Disposition: A | Discharge status: Home / Self Care | Payer: Medicaid Other | Source: Ambulatory Visit | Attending: Internal Medicine | Admitting: Internal Medicine

## 2020-06-16 VITALS — BP 110/70 | HR 85 | Wt 164.8 lb

## 2020-06-16 DIAGNOSIS — Z79899 Other long term (current) drug therapy: Secondary | ICD-10-CM | POA: Diagnosis not present

## 2020-06-16 DIAGNOSIS — J449 Chronic obstructive pulmonary disease, unspecified: Secondary | ICD-10-CM | POA: Diagnosis not present

## 2020-06-16 DIAGNOSIS — Z87891 Personal history of nicotine dependence: Secondary | ICD-10-CM | POA: Diagnosis not present

## 2020-06-16 DIAGNOSIS — I272 Pulmonary hypertension, unspecified: Secondary | ICD-10-CM | POA: Insufficient documentation

## 2020-06-16 DIAGNOSIS — I110 Hypertensive heart disease with heart failure: Secondary | ICD-10-CM | POA: Insufficient documentation

## 2020-06-16 DIAGNOSIS — F1290 Cannabis use, unspecified, uncomplicated: Secondary | ICD-10-CM | POA: Diagnosis not present

## 2020-06-16 DIAGNOSIS — I5032 Chronic diastolic (congestive) heart failure: Secondary | ICD-10-CM | POA: Diagnosis not present

## 2020-06-16 DIAGNOSIS — J9611 Chronic respiratory failure with hypoxia: Secondary | ICD-10-CM | POA: Diagnosis not present

## 2020-06-16 NOTE — Patient Instructions (Signed)
Decrease and wean off your oxygen, keep oxygen levels at 90% or higher  Your physician recommends that you schedule a follow-up appointment in: AS NEEDED

## 2020-06-16 NOTE — Addendum Note (Signed)
Encounter addended by: Scarlette Calico, RN on: 06/16/2020 10:45 AM  Actions taken: Clinical Note Signed

## 2020-06-17 DIAGNOSIS — J449 Chronic obstructive pulmonary disease, unspecified: Secondary | ICD-10-CM | POA: Diagnosis not present

## 2020-06-17 DIAGNOSIS — I50810 Right heart failure, unspecified: Secondary | ICD-10-CM | POA: Diagnosis not present

## 2020-06-17 DIAGNOSIS — J9690 Respiratory failure, unspecified, unspecified whether with hypoxia or hypercapnia: Secondary | ICD-10-CM | POA: Diagnosis not present

## 2020-06-28 ENCOUNTER — Telehealth: Payer: Self-pay | Admitting: Pulmonary Disease

## 2020-06-28 MED ORDER — ALBUTEROL SULFATE HFA 108 (90 BASE) MCG/ACT IN AERS: 2.0000 | INHALATION_SPRAY | RESPIRATORY_TRACT | 3 refills | Status: DC | PRN

## 2020-06-28 MED ORDER — ANORO ELLIPTA 62.5-25 MCG/INH IN AEPB: 1.0000 | INHALATION_SPRAY | Freq: Every morning | RESPIRATORY_TRACT | 5 refills | Status: DC

## 2020-06-28 NOTE — Telephone Encounter (Signed)
Pt has new pt appt scheduled with Dr. Silas Flood 07/19/20. Rx for both Anoro and Ventolin inhalers were sent to preferred pharmacy for pt. Called and spoke with pt letting her know this had been done and stated to her to make sure she kept her upcoming appt and she verbalized understanding. Nothing further needed.

## 2020-07-17 DIAGNOSIS — J9690 Respiratory failure, unspecified, unspecified whether with hypoxia or hypercapnia: Secondary | ICD-10-CM | POA: Diagnosis not present

## 2020-07-17 DIAGNOSIS — I50810 Right heart failure, unspecified: Secondary | ICD-10-CM | POA: Diagnosis not present

## 2020-07-17 DIAGNOSIS — J449 Chronic obstructive pulmonary disease, unspecified: Secondary | ICD-10-CM | POA: Diagnosis not present

## 2020-07-18 ENCOUNTER — Ambulatory Visit (INDEPENDENT_AMBULATORY_CARE_PROVIDER_SITE_OTHER): Payer: Medicaid Other | Admitting: Family Medicine

## 2020-07-18 ENCOUNTER — Other Ambulatory Visit: Payer: Self-pay

## 2020-07-18 ENCOUNTER — Other Ambulatory Visit (HOSPITAL_COMMUNITY)
Admission: RE | Admit: 2020-07-18 | Discharge: 2020-07-18 | Disposition: A | Discharge status: Home / Self Care | Payer: 59 | Source: Ambulatory Visit | Attending: Family Medicine | Admitting: Family Medicine

## 2020-07-18 ENCOUNTER — Encounter: Payer: Self-pay | Admitting: Family Medicine

## 2020-07-18 VITALS — BP 109/75 | HR 93 | Ht 66.5 in | Wt 160.6 lb

## 2020-07-18 DIAGNOSIS — Z1211 Encounter for screening for malignant neoplasm of colon: Secondary | ICD-10-CM | POA: Diagnosis not present

## 2020-07-18 DIAGNOSIS — Z0000 Encounter for general adult medical examination without abnormal findings: Secondary | ICD-10-CM

## 2020-07-18 DIAGNOSIS — I10 Essential (primary) hypertension: Secondary | ICD-10-CM | POA: Diagnosis not present

## 2020-07-18 DIAGNOSIS — Z124 Encounter for screening for malignant neoplasm of cervix: Secondary | ICD-10-CM

## 2020-07-18 DIAGNOSIS — Z1231 Encounter for screening mammogram for malignant neoplasm of breast: Secondary | ICD-10-CM

## 2020-07-18 NOTE — Assessment & Plan Note (Addendum)
See AVS for age appropriate recommendations  PHQ score 0, reviewed and discussed.  BP reviewed and at goal.  Declined STD screening and COVID booster.  Will obtain lipid panel for cholesterol screening.  Papsmear collected, previous normal 2006.  Mammogram ordered, no family history of breast cancer.  Cologuard ordered for colon cancer screening, declined colonoscopy.  No family history of colon cancer. Would qualify for lung cancer screening, would like to discuss with her pulmonologist tomorrow instead.

## 2020-07-18 NOTE — Progress Notes (Signed)
    SUBJECTIVE:   Chief compliant/HPI: annual examination  WANITA DERENZO is a 60 y.o. who presents today for an annual exam.  She denies any concerns today.  Current smoker since about age 6, previously 1 PPD however now 1/3 ppd.  Tries to stay active when she can.  Follows with pulmonology and cardiology for HFpEF, pulmonary hypertension, and COPD.  She denies any difficulty breathing, increased sputum, or worsening cough currently.  Denies a family history of breast, colon, or cervical cancer.  [place] Office Visit from 07/18/2020 in [facility]  PHQ-9 Total Score 0     History tabs reviewed and updated.   OBJECTIVE:   BP 109/75   Pulse 93   Ht 5' 6.5" (1.689 m)   Wt 160 lb 9.6 oz (72.8 kg)   SpO2 91%   BMI 25.53 kg/m   General: Alert, NAD HEENT: NCAT, MMM Cardiac: RRR no m/g/r Lungs: Clear bilaterally, no increased WOB  Abdomen: soft Msk: Normal gait  Ext: Warm, dry, 2+ distal pulses  ASSESSMENT/PLAN:   Annual physical exam See AVS for age appropriate recommendations  PHQ score 0, reviewed and discussed.  BP reviewed and at goal.  Declined STD screening and COVID booster.  Will obtain lipid panel for cholesterol screening.  Papsmear collected, previous normal 2006.  Mammogram ordered, no family history of breast cancer.  Cologuard ordered for colon cancer screening, declined colonoscopy.  No family history of colon cancer. Would qualify for lung cancer screening, would like to discuss with her pulmonologist tomorrow instead.     Follow up in 1 year or sooner if indicated.   Patriciaann Clan, [place] [facility] [place]

## 2020-07-18 NOTE — Patient Instructions (Addendum)
It was wonderful to see you today.  Encourage you to get about 30 minutes of exercise 5 days a week.  This can be walking from the end of the parking lot, taking stairs at work, walking around her house, or doing actual dedicated time for exercise/physical activity.  Recommend doing things that you enjoy.  Please make sure you schedule for your mammogram, this is screening for any breast cancer.  I have also put in an order for Cologuard, you should have a kit mailed to you to give the stool sample.  Lastly, we did a Pap smear today.  Those results should be back within the next week.  I will send a letter or give you a call.

## 2020-07-19 ENCOUNTER — Encounter: Payer: Self-pay | Admitting: Pulmonary Disease

## 2020-07-19 ENCOUNTER — Ambulatory Visit (INDEPENDENT_AMBULATORY_CARE_PROVIDER_SITE_OTHER): Payer: 59 | Admitting: Pulmonary Disease

## 2020-07-19 VITALS — BP 116/72 | HR 85 | Temp 97.7°F | Ht 66.5 in | Wt 161.4 lb

## 2020-07-19 DIAGNOSIS — Z122 Encounter for screening for malignant neoplasm of respiratory organs: Secondary | ICD-10-CM | POA: Diagnosis not present

## 2020-07-19 DIAGNOSIS — J439 Emphysema, unspecified: Secondary | ICD-10-CM

## 2020-07-19 LAB — LIPID PANEL
Chol/HDL Ratio: 2.9 ratio (ref 0.0–4.4)
Cholesterol, Total: 272 mg/dL — ABNORMAL HIGH (ref 100–199)
HDL: 94 mg/dL (ref >39)
LDL Chol Calc (NIH): 162 mg/dL — ABNORMAL HIGH (ref 0–99)
Triglycerides: 98 mg/dL (ref 0–149)
VLDL Cholesterol Cal: 16 mg/dL (ref 5–40)

## 2020-07-19 NOTE — Progress Notes (Signed)
Synopsis: Referred in April 2021 for COPD by Patriciaann Clan, DO.  Subjective:   PATIENT ID: Joy [name] GENDER: female DOB: November 29, 1960, MRN: [id number]  Chief Complaint  Patient presents with  . Consult    Former PC for resp failure. Uses 2L of O2 at night only. States she has been doing ok since last visit.     Joy [name] is a 60 year old woman with a history of COPD, pulmonary hypertension, chronic hypoxic respiratory failure who presents for follow-up.  She continues on Anoro once daily. She discontinued smoking in February 2021 after previously smoking 0.5 packs/day. She uses nocturnal O2, 2L [place]. No desaturation when walked in office. Enrolled in lung cancer screening 07/2020.   Cardiology follow-up visit on 05/17/20 note reviewed. Most recent right heart catheterization with improved pressures compared to previous (PVR 8--> 4 WU, mean PAP 45--> 71m Hg).  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   Patient returns to clinic for routine follow up. Breathing stable. Thins Anoro is helping. No exacerbations requiring prednisone since last visit 10 months ago. She is interested in lung cancer screening. Discussed today. Decision aid used.     Past Medical History:  Diagnosis Date  . Acute congestive heart failure ([facility]   . Acute dyspnea   . Acute on chronic diastolic CHF (congestive heart failure) ([facility] 08/11/2015  . Acute right heart failure ([facility] 08/11/2015  . Bilateral lower extremity edema   . CHF (congestive heart failure) ([facility]   . COPD (chronic obstructive pulmonary disease) ([facility] 08/10/2015   Dx on XRAY 08/08/15.  Needs PFTs   . GASTROESOPHAGEAL REFLUX, NO ESOPHAGITIS 05/16/2006   Qualifier: Diagnosis of  By: KDrucie Ip   . GERD (gastroesophageal reflux disease)   . Hypertension   . Hypervolemia   . Leg swelling hospitalized 08/08/2015   BLE  . PELVIC MASS 07/05/2008   Qualifier: Diagnosis of  By: McDiarmid MD, TSherren Mocha   . Pulmonary  hypertension ([facility]   . Respiratory failure ([facility] 05/03/2019  . Right heart failure ([facility] 08/10/2015  . Tobacco abuse 05/16/2006   Qualifier: Diagnosis of  By: KDrucie Ip      Family History  Problem Relation Age of Onset  . Aneurysm Mother   . Hypertension Brother      Past Surgical History:  Procedure Laterality Date  . RIGHT HEART CATH N/A 09/16/2019   Procedure: RIGHT HEART CATH;  Surgeon: BJolaine Artist MD;  [facility];  Service: Cardiovascular;  Laterality: N/A;  . RIGHT/LEFT HEART CATH AND CORONARY ANGIOGRAPHY N/A 12/26/2018   Procedure: RIGHT/LEFT HEART CATH AND CORONARY ANGIOGRAPHY;  Surgeon: BJolaine Artist MD;  [facility];  Service: Cardiovascular;  Laterality: N/A;  . TUBAL LIGATION      Social History   Socioeconomic History  . Marital status: Married    Spouse name: Not on file  . Number of children: Not on file  . Years of education: Not on file  . Highest education level: Not on file  Occupational History  . Not on file  Tobacco Use  . Smoking status: Former Smoker    Packs/day: 1.00    Years: 36.00    Pack years: 36.00    Types: Cigarettes    Quit date: 04/20/2019    Years since quitting: 1.2  . Smokeless tobacco: Former USystems developer   Types: Chew  . Tobacco comment: "quit chewing in my early '  20s"  Vaping Use  . Vaping Use: Never used  Substance and Sexual Activity  . Alcohol use: Yes    Alcohol/week: 23.0 standard drinks    Types: 12 Cans of beer, 11 Shots of liquor per week  . Drug use: Yes    Types: Marijuana    Comment: 08/08/2015 "I smoke marijuana when I have it; probably a couple times/week"  . Sexual activity: Yes  Other Topics Concern  . Not on file  Social History Narrative  . Not on file   Social Determinants of Health   Financial Resource Strain: Not on file  Food Insecurity: Not on file  Transportation Needs: Not on file  Physical Activity: Not on file  Stress: Not on file  Social  Connections: Not on file  Intimate Partner Violence: Not on file     No Known Allergies   Immunization History  Administered Date(s) Administered  . Influenza,inj,Quad PF,6+ Mos 03/06/2018, 12/27/2018  . PFIZER(Purple Top)SARS-COV-2 Vaccination 06/04/2019, 06/25/2019  . PPD Test 11/04/2015  . Pneumococcal Conjugate-13 09/29/2019  . Pneumococcal Polysaccharide-23 08/09/2015, 03/06/2018  . Td 01/17/2005    Outpatient Medications Prior to Visit  Medication Sig Dispense Refill  . acetaZOLAMIDE (DIAMOX) 250 MG tablet Take 1 tablet (250 mg total) by mouth daily. 30 tablet 0  . albuterol (VENTOLIN HFA) 108 (90 Base) MCG/ACT inhaler Inhale 2 puffs into the lungs every 4 (four) hours as needed for wheezing or shortness of breath. 18 g 3  . Multiple Vitamin (MULTIVITAMIN WITH MINERALS) TABS tablet Take 1 tablet by mouth daily.    Marland Kitchen spironolactone (ALDACTONE) 25 MG tablet TAKE 1 TABLET BY MOUTH EVERY DAY 30 tablet 2  . torsemide (DEMADEX) 20 MG tablet TAKE 1 TABLET BY MOUTH EVERY DAY 30 tablet 2  . umeclidinium-vilanterol (ANORO ELLIPTA) 62.5-25 MCG/INH AEPB Inhale 1 puff into the lungs in the morning. 60 each 5  . potassium chloride SA (KLOR-CON) 20 MEQ tablet Take 6 tablets (120 mEq total) by mouth daily for 1 day, THEN 2 tablets (40 mEq total) daily. 66 tablet 3   No facility-administered medications prior to visit.      Objective:   Vitals:   07/19/20 0914  BP: 116/72  Pulse: 85  Temp: 97.7 F (36.5 C)  TempSrc: Temporal  SpO2: 92%  Weight: 161 lb 6.4 oz (73.2 kg)  Height: 5' 6.5" (1.689 m)   92% on   RA BMI Readings from Last 3 Encounters:  07/19/20 25.66 kg/m  07/18/20 25.53 kg/m  06/16/20 26.60 kg/m   Wt Readings from Last 3 Encounters:  07/19/20 161 lb 6.4 oz (73.2 kg)  07/18/20 160 lb 9.6 oz (72.8 kg)  06/16/20 164 lb 12.8 oz (74.8 kg)    Physical Exam Vitals reviewed.  Constitutional:      General: She is not in acute distress.    Appearance: She is not  ill-appearing.  HENT:     Head: Normocephalic and atraumatic.  Eyes:     General: No scleral icterus. Cardiovascular:     Rate and Rhythm: Normal rate and regular rhythm.     Heart sounds: No murmur heard.   Pulmonary:     Comments: CTAB, no conversational dyspnea Abdominal:     General: There is no distension.     Palpations: Abdomen is soft.     Tenderness: There is no abdominal tenderness.  Musculoskeletal:        General: No swelling or deformity.     Cervical back: Neck supple.  Lymphadenopathy:     Cervical: No cervical adenopathy.  Skin:    General: Skin is warm and dry.     Findings: No rash.  Neurological:     General: No focal deficit present.     Mental Status: She is alert.     Coordination: Coordination normal.  Psychiatric:        Mood and Affect: Mood normal.        Behavior: Behavior normal.      CBC    Component Value Date/Time   WBC 7.0 09/09/2019 1132   RBC 4.39 09/09/2019 1132   HGB 13.9 09/16/2019 1536   HCT 41.0 09/16/2019 1536   HCT 54.3 (H) 05/02/2019 0546   PLT 254 09/09/2019 1132   MCV 95.2 09/09/2019 1132   MCH 29.2 09/09/2019 1132   MCHC 30.6 09/09/2019 1132   RDW 13.8 09/09/2019 1132   LYMPHSABS 1.7 04/30/2019 1022   MONOABS 0.6 04/30/2019 1022   EOSABS 0.0 04/30/2019 1022   BASOSABS 0.1 04/30/2019 1022    CHEMISTRY No results for input(s): NA, K, CL, CO2, GLUCOSE, BUN, CREATININE, CALCIUM, MG, PHOS in the last 168 hours. CrCl cannot be calculated (Patient's most recent lab result is older than the maximum 21 days allowed.).   Chest Imaging- films reviewed: CXR, 2 view 07/03/2019-interpreted as clear lungs, hyperinflated.  CTA chest 08/10/2015-is a system, no significant emphysema.  Very large pulmonary artery.  No PE.  VQ scan- low probability for PE; asymmetric but matched perfusion and ventilation abnormalities  Pulmonary Functions Testing Results: PFT Results Latest Ref Rng & Units 07/27/2019  FVC-Pre L 2.03   FVC-Predicted Pre % 71  FVC-Post L 1.79  FVC-Predicted Post % 63  Pre FEV1/FVC % % 53  Post FEV1/FCV % % 52  FEV1-Pre L 1.07  FEV1-Predicted Pre % 48  FEV1-Post L 0.94   2021- severe obstruction, no significant BD reversibility. Test performed with difficulty.  Echocardiogram 05/02/2019: LVEF 60 to 65% with regional wall motion abnormalities.  Unable to evaluate diastolic function.  Enlarged RV and RA with atrial septal bowing and ventricular septal flattening.  Moderately reduced RV systolic function.  Trivial MR.  Dilated IVC with limited respiratory variability.  Heart Catheterization 12/26/2018:  LHC/RHC 12/26/2018 Ao = 105/66 (85) LV = 106/12 RA = 9 RV = 75/10 PA = 73/24 (45) PCW = 14 Fick cardiac output/index = 3.8/2.1 PVR = 8.0 WU Ao sat = 83% (RA - no sedation) PA sat = 54%, 56%  Arterial sats 78-83% on ABG (sats read 85-87% on pulse ox at the time)  ABG in cath lab (RA): 7.45/71/49/83% - no sedation   RHC 09/16/19: RA = 2 RV = 56/6 PA = 55/20 (33) PCW = 9 Fick cardiac output/index = 5.6/3.1 PVR = 4.0 WU Ao sat = 95% PA sat = 73%, 77% High SVC sat = 70% Assessment: 1. Moderate PAH  2. Normal left-sided pressures with normal output 3. No evidence of intra-cardiac shunting.  Plan/Discussion: PA pressures improved since previous. Continue medical therapy.   Assessment & Plan:     ICD-10-CM   1. Pulmonary emphysema, unspecified emphysema type ([place])  J43.9   2. Encounter for screening for lung cancer  Z12.2 CT CHEST LUNG CA SCREEN LOW DOSE W/O CM    COPD- GOLD C -Continue Anoro once daily.  Reiterated the importance of compliance.  She has done well with this recently. -Continue albuterol every 4 hours as needed as her rescue inhaler. -Reinforced the importance of total  smoking cessation - congratulated on over 1 year since quitting  Chronic hypoxic respiratory failure requiring nocturnal oxygen -Continue 2 L supplemental oxygen at night for  sleeping.  Pulmonary hypertension- WHO group III. She is currently very well compensated based on symptoms and had significant improvement in her pulmonary hemodynamics since beginning treatment with nocturnal O2t.  No evidence of CTEPH on VQ. No OSA.  -Not a candidate for pulmonary vasodilators. -Continue supplemental oxygen at night. -Continue Anoro once daily -Continue currently prescribed diuretic regimen. -Up-to-date on Covid and flu vaccines.    Lung Cancer screening: Discussed the risk and benefits of lung cancer screening with yearly low dose CT scan. She has 36 pack year history and quit February 6287, 60 years old. Meets criteria for screening. Used decision aid "shouldiscreen.com" which based on her individual characteristics portrays a 3.6% risk of lung cancer in next 6 years. Reviewed would save 7 deaths in 1k individuals (36 vs 29). Risks of screening based on decision aid reviewed as well. After shared decision making, patient agrees to yearly LDCT for lung cancer screening. This was ordered today.    RTC in 12 months.    Lanier Clam, MD [facility] 07/19/2020 9:34 AM

## 2020-07-19 NOTE — Addendum Note (Signed)
Addended by: Valerie Salts on: 07/19/2020 04:20 PM   Modules accepted: Orders

## 2020-07-19 NOTE — Patient Instructions (Signed)
Nice to meet you  Continue the [name] your breathing  After discussion, we decided to pursue low-dose CT scan for lung cancer screening.  Someone should call and schedule this for you in the next day or 2.  Return to clinic in 1 year for follow-up

## 2020-07-20 LAB — CYTOLOGY - PAP
Comment: NEGATIVE
Diagnosis: NEGATIVE
High risk HPV: NEGATIVE

## 2020-07-28 ENCOUNTER — Ambulatory Visit: Payer: Medicaid Other

## 2020-07-28 ENCOUNTER — Ambulatory Visit (HOSPITAL_COMMUNITY)
Admission: RE | Admit: 2020-07-28 | Discharge: 2020-07-28 | Disposition: A | Discharge status: Home / Self Care | Payer: 59 | Source: Ambulatory Visit | Attending: Pulmonary Disease | Admitting: Pulmonary Disease

## 2020-07-28 ENCOUNTER — Other Ambulatory Visit: Payer: Self-pay

## 2020-07-28 ENCOUNTER — Other Ambulatory Visit: Payer: Self-pay | Admitting: Family Medicine

## 2020-07-28 DIAGNOSIS — Z122 Encounter for screening for malignant neoplasm of respiratory organs: Secondary | ICD-10-CM | POA: Insufficient documentation

## 2020-07-28 DIAGNOSIS — E785 Hyperlipidemia, unspecified: Secondary | ICD-10-CM

## 2020-07-28 DIAGNOSIS — J439 Emphysema, unspecified: Secondary | ICD-10-CM | POA: Insufficient documentation

## 2020-07-28 MED ORDER — ROSUVASTATIN CALCIUM 10 MG PO TABS: 10.0000 mg | ORAL_TABLET | Freq: Every day | ORAL | 3 refills | Status: DC

## 2020-07-29 ENCOUNTER — Ambulatory Visit: Payer: Medicaid Other

## 2020-07-31 ENCOUNTER — Other Ambulatory Visit: Payer: Self-pay | Admitting: Family Medicine

## 2020-08-01 ENCOUNTER — Other Ambulatory Visit: Payer: Self-pay | Admitting: Family Medicine

## 2020-08-30 ENCOUNTER — Other Ambulatory Visit: Payer: Self-pay | Admitting: Family Medicine

## 2020-10-30 ENCOUNTER — Other Ambulatory Visit: Payer: Self-pay | Admitting: Family Medicine

## 2020-10-30 DIAGNOSIS — E785 Hyperlipidemia, unspecified: Secondary | ICD-10-CM

## 2020-11-17 ENCOUNTER — Other Ambulatory Visit: Payer: Self-pay | Admitting: Family Medicine

## 2020-11-17 MED ORDER — ACETAZOLAMIDE 250 MG PO TABS: 250.0000 mg | ORAL_TABLET | Freq: Every day | ORAL | 0 refills | Status: DC

## 2020-11-17 MED ORDER — ALBUTEROL SULFATE HFA 108 (90 BASE) MCG/ACT IN AERS: 2.0000 | INHALATION_SPRAY | RESPIRATORY_TRACT | 3 refills | Status: DC | PRN

## 2020-11-17 MED ORDER — SPIRONOLACTONE 25 MG PO TABS: 25.0000 mg | ORAL_TABLET | Freq: Every day | ORAL | 1 refills | Status: DC

## 2020-11-17 MED ORDER — ANORO ELLIPTA 62.5-25 MCG/INH IN AEPB: 1.0000 | INHALATION_SPRAY | Freq: Every morning | RESPIRATORY_TRACT | 5 refills | Status: DC

## 2020-11-23 ENCOUNTER — Other Ambulatory Visit: Payer: Self-pay

## 2020-11-23 DIAGNOSIS — E785 Hyperlipidemia, unspecified: Secondary | ICD-10-CM

## 2020-11-23 MED ORDER — ROSUVASTATIN CALCIUM 10 MG PO TABS: 10.0000 mg | ORAL_TABLET | Freq: Every day | ORAL | 1 refills | Status: DC

## 2020-11-23 MED ORDER — TORSEMIDE 20 MG PO TABS: 20.0000 mg | ORAL_TABLET | Freq: Every day | ORAL | 1 refills | Status: DC

## 2020-12-13 ENCOUNTER — Other Ambulatory Visit: Payer: Self-pay

## 2020-12-13 ENCOUNTER — Ambulatory Visit
Admission: RE | Admit: 2020-12-13 | Discharge: 2020-12-13 | Disposition: A | Discharge status: Home / Self Care | Payer: Medicaid Other | Source: Ambulatory Visit | Attending: Family Medicine | Admitting: Family Medicine

## 2020-12-13 DIAGNOSIS — Z1231 Encounter for screening mammogram for malignant neoplasm of breast: Secondary | ICD-10-CM

## 2020-12-16 ENCOUNTER — Other Ambulatory Visit: Payer: Self-pay | Admitting: Family Medicine

## 2020-12-19 ENCOUNTER — Other Ambulatory Visit: Payer: Self-pay | Admitting: Family Medicine

## 2020-12-19 DIAGNOSIS — R928 Other abnormal and inconclusive findings on diagnostic imaging of breast: Secondary | ICD-10-CM

## 2021-01-04 ENCOUNTER — Ambulatory Visit
Admission: RE | Admit: 2021-01-04 | Discharge: 2021-01-04 | Disposition: A | Discharge status: Home / Self Care | Payer: 59 | Source: Ambulatory Visit | Attending: Family Medicine | Admitting: Family Medicine

## 2021-01-04 ENCOUNTER — Other Ambulatory Visit: Payer: Self-pay | Admitting: Family Medicine

## 2021-01-04 ENCOUNTER — Other Ambulatory Visit: Payer: Self-pay

## 2021-01-04 DIAGNOSIS — N6320 Unspecified lump in the left breast, unspecified quadrant: Secondary | ICD-10-CM

## 2021-01-04 DIAGNOSIS — R928 Other abnormal and inconclusive findings on diagnostic imaging of breast: Secondary | ICD-10-CM

## 2021-01-10 ENCOUNTER — Ambulatory Visit
Admission: RE | Admit: 2021-01-10 | Discharge: 2021-01-10 | Disposition: A | Discharge status: Home / Self Care | Payer: 59 | Source: Ambulatory Visit | Attending: Family Medicine | Admitting: Family Medicine

## 2021-01-10 ENCOUNTER — Other Ambulatory Visit: Payer: Self-pay

## 2021-01-10 DIAGNOSIS — N6320 Unspecified lump in the left breast, unspecified quadrant: Secondary | ICD-10-CM

## 2021-01-11 ENCOUNTER — Telehealth: Payer: Self-pay | Admitting: Family Medicine

## 2021-01-11 NOTE — Telephone Encounter (Signed)
..  Medicaid Managed Care   Unsuccessful Outreach Note  01/11/2021 Name: Joy [name] MRN: [id number] DOB: 1961-02-18  Referred by: Eulis Foster, MD Reason for referral : High Risk Managed Medicaid (I called the patient today to get her scheduled for a phone visit with the [facility] Team. Her number was not working when I called.)   An unsuccessful telephone outreach was attempted today. The patient was referred to the case management team for assistance with care management and care coordination.   Follow Up Plan: The care management team will reach out to the patient again over the next 7-14 days.  [place], High Risk Medicaid [place]   .j

## 2021-01-24 ENCOUNTER — Telehealth: Payer: Self-pay | Admitting: Family Medicine

## 2021-01-24 NOTE — Telephone Encounter (Signed)
..  Medicaid Managed Care   Unsuccessful Outreach Note  01/24/2021 Name: Joy [name] MRN: [id number] DOB: 07-16-1960  Referred by: Eulis Foster, MD Reason for referral : High Risk Managed Medicaid (I called the patient today to offer her the services of the [facility] Team. I left my name and number on her VM.)   A second unsuccessful telephone outreach was attempted today. The patient was referred to the case management team for assistance with care management and care coordination.   Follow Up Plan: The care management team will reach out to the patient again over the next 14 days.  [place], High Risk Medicaid [place]    .j

## 2021-01-31 ENCOUNTER — Other Ambulatory Visit: Payer: Self-pay | Admitting: Obstetrics and Gynecology

## 2021-01-31 DIAGNOSIS — J9601 Acute respiratory failure with hypoxia: Secondary | ICD-10-CM

## 2021-01-31 NOTE — Patient Instructions (Signed)
Visit Information  [name] was given information about Medicaid Managed Care team care coordination services as a part of their [place] Medicaid benefit. Joy [name] verbally consented to engagement with the [facility] Managed Care team.   If you are experiencing a medical emergency, please call 911 or report to your local emergency department or urgent care.   If you have a non-emergency medical problem during routine business hours, please contact your provider's office and ask to speak with a nurse.   For questions related to your [facility], please call: [phone] or visit the homepage here: [email or web address]  If you would like to schedule transportation through your [facility], please call the following number at least 2 days in advance of your appointment: [phone].   Call the [place] at [phone], at any time, 24 hours a day, 7 days a week. If you are in danger or need immediate medical attention call 911.  If you would like help to quit smoking, call 1-800-QUIT-NOW 6283758870) OR Espaol: 1-855-Djelo-Ya (3-212-248-2500) o para ms informacin haga clic aqu or Text READY to 200-400 to register via text  Joy [name] - following are the goals we discussed in your visit today:   Goals Addressed    Patient will self administer medications as prescribed Patient will attend all scheduled provider appointments Patient will call pharmacy for medication refills Patient will continue to perform ADL's independently Patient will continue to perform IADL's independently Patient will call provider office for new concerns or questions  The patient verbalized understanding of instructions provided today and declined a print copy of patient instruction materials.   The Managed Medicaid care management team will reach out to the  patient again over the next 30 days.  The  Patient has been provided with contact information for the Managed Medicaid care management team and has been advised to call with any health related questions or concerns.   Aida Raider RN, BSN [place]  [facility] [employer]ator - Managed Medicaid High Risk [phone].   Following is a copy of your plan of care:  Care Plan : [place] of Care  Updates made by Gayla Medicus, RN since 01/31/2021 12:00 AM     Problem: Chronic Disease Management and Care Coordiantion Needs   Priority: High  Onset Date: 01/31/2021     Long-Range Goal: Establish Plan of Care for Chronic Disease Management Needs   Start Date: 01/31/2021  Expected End Date: 05/03/2021  Priority: High  Note:   Current Barriers:  Knowledge Deficits related to plan of care for management of HTN, HF, COPD, tobacco use, pulmonary hypertension, chronic hypoxic respiratory failure.  Patient currently on 2L of Oxygen-no complaints today. Care Coordination needs related to dental resources Chronic Disease Management support and education needs related to HTN, HF, COPD, GERD, tobacco use history of COPD, pulmonary hypertension, chronic hypoxic respiratory failure   RNCM Clinical Goal(s):  Patient will verbalize understanding of plan for management of HTN, HF, COPD, GERD, tobacco use, pulmonary hypertension, chronic hypoxic respiratory failure verbalize basic understanding of  HTN, HF, COPD, GERD, tobacco use, pulmonary hypertension, chronic hypoxic respiratory failure  disease process and self health management plan  take all medications exactly as prescribed and will call provider for medication related questions demonstrate understanding of rationale for each prescribed medication  attend all scheduled medical appointments:  continue to work with RN Care Manager to address  care management and care coordination needs related to  HTN, HF, COPD,  GERD, tobacco use, pulmonary HTN, chronic hypoxic respiratory failure. work with pharmacist for medication review work with [employer] care guide to address needs related to  dental resources through collaboration with Consulting civil engineer, provider, and care team.   Interventions: Inter-disciplinary care team collaboration (see longitudinal plan of care) Evaluation of current treatment plan related to  self management and patient's adherence to plan as established by provider Pharmacy referral for medication review  COPD Interventions:   Referral made to community resources care guide team for assistance with dental resources; Assessed social determinant of health barriers;   Patient Goals/Self-Care Activities: Patient will self administer medications as prescribed Patient will attend all scheduled provider appointments Patient will call pharmacy for medication refills Patient will continue to perform ADL's independently Patient will continue to perform IADL's independently Patient will call provider office for new concerns or questions  Follow Up Plan:  The patient has been provided with contact information for the care management team and has been advised to call with any health related questions or concerns.  The care management team will reach out to the patient again over the next 30 days.   Evidence-based guidance:  Review biopsychosocial determinants of health screens.  Determine level of modifiable health risk.  Assess level of patient activation, level of readiness, importance and confidence to make changes.  Evoke change talk using open-ended questions, pros and cons, as well as looking forward.  Identify areas where behavior change may lead to improved health.  Partner with patient to develop a robust self-management plan that includes lifestyle factors, such as weight loss, exercise and healthy nutrition, as well as goals specific to disease risks.  Support patient and  family/caregiver active participation in decision-making and self-management plan.  Implement additional goals and interventions based on identified risk factors to reduce health risk.  Facilitate advance care planning.  Review need for preventive screening based on age, sex, family history and health history.   Notes:

## 2021-01-31 NOTE — Patient Outreach (Signed)
Medicaid Managed Care   Nurse Care Manager Note  01/31/2021 Name:  Joy [name] MRN:  [id number] DOB:  02-07-1961  Joy [name] is an 60 y.o. year old female who is a primary patient of Simmons-Robinson, Riki Sheer, MD.  The [place] team was consulted for assistance with:    Chronic healthcare management needs.  Joy [name] was given information about Medicaid Managed Care Coordination team services today. Joy [name] Patient agreed to services and verbal consent obtained.  Engaged with patient by telephone for initial visit in response to provider referral for case management and/or care coordination services.   Assessments/Interventions:  Review of past medical history, allergies, medications, health status, including review of consultants reports, laboratory and other test data, was performed as part of comprehensive evaluation and provision of chronic care management services.  SDOH (Social Determinants of Health) assessments and interventions performed: SDOH Interventions    Flowsheet Row Most Recent Value  SDOH Interventions   Intimate Partner Violence Interventions Intervention Not Indicated       Care Plan  No Known Allergies  Medications Reviewed Today     Reviewed by Joy Medicus, RN (Registered Nurse) on 01/31/21 at [place] List Status: <None>   Medication Order Taking? Sig Documenting Provider Last Dose Status Informant  acetaZOLAMIDE (DIAMOX) 250 MG tablet [id number] Yes Take 1 tablet (250 mg total) by mouth daily. Simmons-Robinson, Makiera, MD Taking Active   albuterol (VENTOLIN HFA) 108 (90 Base) MCG/ACT inhaler [id number] Yes Inhale 2 puffs into the lungs every 4 (four) hours as needed for wheezing or shortness of breath. Simmons-Robinson, Makiera, MD Taking Active   Multiple Vitamin (MULTIVITAMIN WITH MINERALS) TABS tablet [id number] Yes Take 1 tablet by mouth daily. [provider] Taking Active Self  rosuvastatin (CRESTOR)  10 MG tablet [id number] Yes Take 1 tablet (10 mg total) by mouth daily. Increase to 67m after one week if tolerating well. Simmons-Robinson, Makiera, MD Taking Active   spironolactone (ALDACTONE) 25 MG tablet [id number] Take 1 tablet (25 mg total) by mouth daily. Simmons-Robinson, Makiera, MD Taking Active   torsemide (DEMADEX) 20 MG tablet [id number] Take 1 tablet (20 mg total) by mouth daily. Simmons-Robinson, Makiera, MD Taking Active   umeclidinium-vilanterol (ANORO ELLIPTA) 62.5-25 MCG/INH AEPB [id number] Inhale 1 puff into the lungs in the morning. Joy Foster MD Taking Active             Patient Active Problem List   Diagnosis Date Noted   Annual physical exam 07/18/2020   Right heart failure ([place] 05/03/2019   COPD (chronic obstructive pulmonary disease) ([place] 08/10/2015   Pulmonary hypertension ([facility]    HFpEF 08/08/2015   Essential hypertension    Tobacco abuse 05/16/2006   GASTROESOPHAGEAL REFLUX, NO ESOPHAGITIS 05/16/2006    Conditions to be addressed/monitored per PCP order:   chronic healthcare management needs,  HTN, HF, COPD, GERD, tobacco use history of COPD, pulmonary hypertension, chronic hypoxic respiratory failure   Care Plan : RN Care Manager Plan of Care  Updates made by Joy Medicus RN since 01/31/2021 12:00 AM     Problem: Chronic Disease Management and Care Coordiantion Needs   Priority: High  Onset Date: 01/31/2021     Long-Range Goal: Establish Plan of Care for Chronic Disease Management Needs   Start Date: 01/31/2021  Expected End Date: 05/03/2021  Priority: High  Note:   Current Barriers:  Knowledge Deficits related to plan of care for management of HTN, HF, COPD,  tobacco use, pulmonary hypertension, chronic hypoxic respiratory failure.  Patient currently on 2L of Oxygen-no complaints today. Care Coordination needs related to dental resources Chronic Disease Management support and education needs related to HTN, HF,  COPD, GERD, tobacco use history of COPD, pulmonary hypertension, chronic hypoxic respiratory failure   RNCM Clinical Goal(s):  Patient will verbalize understanding of plan for management of HTN, HF, COPD, GERD, tobacco use, pulmonary hypertension, chronic hypoxic respiratory failure verbalize basic understanding of  HTN, HF, COPD, GERD, tobacco use, pulmonary hypertension, chronic hypoxic respiratory failure  disease process and self health management plan  take all medications exactly as prescribed and will call provider for medication related questions demonstrate understanding of rationale for each prescribed medication  attend all scheduled medical appointments:  continue to work with RN Care Manager to address care management and care coordination needs related to  HTN, HF, COPD, GERD, tobacco use, pulmonary HTN, chronic hypoxic respiratory failure. work with pharmacist for medication review work with [employer] care guide to address needs related to  dental resources through collaboration with Consulting civil engineer, provider, and care team.   Interventions: Inter-disciplinary care team collaboration (see longitudinal plan of care) Evaluation of current treatment plan related to  self management and patient's adherence to plan as established by provider Pharmacy referral for medication review  COPD Interventions:   Referral made to community resources care guide team for assistance with dental resources; Assessed social determinant of health barriers;   Patient Goals/Self-Care Activities: Patient will self administer medications as prescribed Patient will attend all scheduled provider appointments Patient will call pharmacy for medication refills Patient will continue to perform ADL's independently Patient will continue to perform IADL's independently Patient will call provider office for new concerns or questions  Follow Up Plan:  The patient has been provided with contact  information for the care management team and has been advised to call with any health related questions or concerns.  The care management team will reach out to the patient again over the next 30 days.   Evidence-based guidance:  Review biopsychosocial determinants of health screens.  Determine level of modifiable health risk.  Assess level of patient activation, level of readiness, importance and confidence to make changes.  Evoke change talk using open-ended questions, pros and cons, as well as looking forward.  Identify areas where behavior change may lead to improved health.  Partner with patient to develop a robust self-management plan that includes lifestyle factors, such as weight loss, exercise and healthy nutrition, as well as goals specific to disease risks.  Support patient and family/caregiver active participation in decision-making and self-management plan.  Implement additional goals and interventions based on identified risk factors to reduce health risk.  Facilitate advance care planning.  Review need for preventive screening based on age, sex, family history and health history.   Notes:     Follow Up:  Patient agrees to Care Plan and Follow-up.  Plan: The Managed Medicaid care management team will reach out to the patient again over the next 30 days. and The  Patient has been provided with contact information for the Managed Medicaid care management team and has been advised to call with any health related questions or concerns.  Date/time of next scheduled RN care management/care coordination outreach:  03/02/21 at 3.

## 2021-02-17 ENCOUNTER — Other Ambulatory Visit: Payer: Self-pay | Admitting: Pulmonary Disease

## 2021-02-21 ENCOUNTER — Telehealth: Payer: Self-pay

## 2021-02-21 NOTE — Telephone Encounter (Signed)
   Telephone encounter was:  Unsuccessful.  02/21/2021 Name: Joy [name] MRN: [id number] DOB: [birth year]  Unsuccessful outbound call made today to assist with:   dental providers.  Outreach Attempt:  1st Attempt  A HIPAA compliant voice message was left requesting a return call.  Instructed patient to call back at [phone].  Amman Bartel, AAS Paralegal, [facility]  [address], [place] [address] ??[name]  ?? [phone]   [email or web address]

## 2021-02-28 ENCOUNTER — Telehealth: Payer: Self-pay | Admitting: Pharmacist

## 2021-02-28 ENCOUNTER — Ambulatory Visit: Payer: Self-pay

## 2021-02-28 NOTE — Patient Outreach (Signed)
02/28/2021 Name: Joy [name] MRN: [id number] DOB: September 12, 1960  Referred by: Eulis Foster, MD Reason for referral : No chief complaint on file.   An unsuccessful telephone outreach was attempted today. The patient was referred to the case management team for assistance with care management and care coordination.    Follow Up Plan: The Managed Medicaid care management team will reach out to the patient again over the next 10 days.   Hughes Better PharmD, CPP High Risk Managed Medicaid [place] [phone]

## 2021-03-02 ENCOUNTER — Other Ambulatory Visit: Payer: Self-pay

## 2021-03-02 ENCOUNTER — Other Ambulatory Visit: Payer: Self-pay | Admitting: Obstetrics and Gynecology

## 2021-03-02 NOTE — Patient Instructions (Signed)
Visit Information  Joy [name] was given information about Medicaid Managed Care team care coordination services as a part of their [place] Medicaid benefit. Joy [name] verbally consented to engagement with the [facility] Managed Care team.   If you are experiencing a medical emergency, please call 911 or report to your local emergency department or urgent care.   If you have a non-emergency medical problem during routine business hours, please contact your provider's office and ask to speak with a nurse.   For questions related to your [facility], please call: [phone] or visit the homepage here: [email or web address]  If you would like to schedule transportation through your [facility], please call the following number at least 2 days in advance of your appointment: [phone].   Call the [facility] at [phone], at any time, 24 hours a day, 7 days a week. If you are in danger or need immediate medical attention call 911.  If you would like help to quit smoking, call 1-800-QUIT-NOW 601-699-5952) OR Espaol: 1-855-Djelo-Ya (9-485-462-7035) o para ms informacin haga clic aqu or Text READY to 200-400 to register via text  Joy [name] - following are the goals we discussed in your visit today:  - schedule appointment for flu shot - schedule appointment for vaccines needed due to my age or health - schedule recommended health tests (blood work, mammogram, colonoscopy, pap test) - schedule and keep appointment for annual check-up  The patient verbalized understanding of instructions provided today and declined a print copy of patient instruction materials.   The Managed Medicaid care management team will reach out to the patient again over the next 30 days.  The  Patient  has been provided with contact information for the Managed  Medicaid care management team and has been advised to call with any health related questions or concerns.   Aida Raider RN, BSN [place]   [facility] Curator - Managed Medicaid High Risk [phone].   Following is a copy of your plan of care:  Care Plan : [place] of Care  Updates made by Gayla Medicus, RN since 03/02/2021 12:00 AM     Problem: Chronic Disease Management and Care Coordiantion Needs   Priority: High  Onset Date: 01/31/2021     Long-Range Goal: Establish Plan of Care for Chronic Disease Management Needs   Start Date: 01/31/2021  Expected End Date: 05/03/2021  Priority: High  Note:   Current Barriers:  Knowledge Deficits related to plan of care for management of HTN, HF, COPD, tobacco use, pulmonary hypertension, chronic hypoxic respiratory failure.  Patient currently on 2L of Oxygen at night Care Coordination needs related to dental resources Chronic Disease Management support and education needs related to HTN, HF, COPD, GERD, tobacco use history of COPD, pulmonary hypertension, chronic hypoxic respiratory failure  03/02/21:  patient without complaint today. RNCM Clinical Goal(s):  Patient will verbalize understanding of plan for management of HTN, HF, COPD, GERD, tobacco use, pulmonary hypertension, chronic hypoxic respiratory failure verbalize basic understanding of  HTN, HF, COPD, GERD, tobacco use, pulmonary hypertension, chronic hypoxic respiratory failure  disease process and self health management plan  take all medications exactly as prescribed and will call provider for medication related questions demonstrate understanding of rationale for each prescribed medication  attend all scheduled medical appointments:  continue to work with RN Care Manager to address care management and care coordination needs related  to  HTN, HF, COPD, GERD, tobacco use, pulmonary HTN, chronic hypoxic respiratory failure. work with  pharmacist for medication review work with [employer] care guide to address needs related to  dental resources through collaboration with Consulting civil engineer, provider, and care team.   Interventions: Inter-disciplinary care team collaboration (see longitudinal plan of care) Evaluation of current treatment plan related to  self management and patient's adherence to plan as established by provider Pharmacy referral for medication review Collaborated with Pharmacist for medication review Collaborated with Care Guide for dental resources Care Guide referral for dental resources Assessed SDOH barriers  Patient Goals/Self-Care Activities: Patient will self administer medications as prescribed Patient will attend all scheduled provider appointments Patient will call pharmacy for medication refills Patient will continue to perform ADL's independently Patient will continue to perform IADL's independently Patient will call provider office for new concerns or questions  Follow Up Plan:  The patient has been provided with contact information for the care management team and has been advised to call with any health related questions or concerns.  The care management team will reach out to the patient again over the next 30 days.   Evidence-based guidance:  Review biopsychosocial determinants of health screens.  Determine level of modifiable health risk.  Assess level of patient activation, level of readiness, importance and confidence to make changes.  Evoke change talk using open-ended questions, pros and cons, as well as looking forward.  Identify areas where behavior change may lead to improved health.  Partner with patient to develop a robust self-management plan that includes lifestyle factors, such as weight loss, exercise and healthy nutrition, as well as goals specific to disease risks.  Support patient and family/caregiver active participation in decision-making and self-management  plan.  Implement additional goals and interventions based on identified risk factors to reduce health risk.  Facilitate advance care planning.  Review need for preventive screening based on age, sex, family history and health history.

## 2021-03-02 NOTE — Patient Outreach (Signed)
Medicaid Managed Care   Nurse Care Manager Note  03/02/2021 Name:  Joy [name] MRN:  [id number] DOB:  03/13/1961  Joy [name] is an 60 y.o. year old female who is a primary patient of Simmons-Robinson, Riki Sheer, MD.  The [place] team was consulted for assistance with:    Chronic healthcare management needs  Ms. Kubin was given information about Medicaid Managed Care Coordination team services today. Joy [name] Patient agreed to services and verbal consent obtained.  Engaged with patient by telephone for follow up visit in response to provider referral for case management and/or care coordination services.   Assessments/Interventions:  Review of past medical history, allergies, medications, health status, including review of consultants reports, laboratory and other test data, was performed as part of comprehensive evaluation and provision of chronic care management services.  SDOH (Social Determinants of Health) assessments and interventions performed: SDOH Interventions    Flowsheet Row Most Recent Value  SDOH Interventions   Housing Interventions Intervention Not Indicated  Social Connections Interventions Intervention Not Indicated       Care Plan  No Known Allergies  Medications Reviewed Today     Reviewed by Gayla Medicus, RN (Registered Nurse) on 03/02/21 at 1510  Med List Status: <None>   Medication Order Taking? Sig Documenting Provider Last Dose Status Informant  acetaZOLAMIDE (DIAMOX) 250 MG tablet [id number] No Take 1 tablet (250 mg total) by mouth daily. Simmons-Robinson, Makiera, MD Taking Active   albuterol (VENTOLIN HFA) 108 (90 Base) MCG/ACT inhaler [id number]  INHALE 2 PUFFS INTO THE LUNGS EVERY 4 HOURS AS NEEDED FOR WHEEZING OR SHORTNESS OF BREATH. Hunsucker, Bonna Gains, MD  Active   Multiple Vitamin (MULTIVITAMIN WITH MINERALS) TABS tablet [id number] No Take 1 tablet by mouth daily. [provider] Taking Active Self   rosuvastatin (CRESTOR) 10 MG tablet [id number] No Take 1 tablet (10 mg total) by mouth daily. Increase to 60m after one week if tolerating well. Simmons-Robinson, Makiera, MD Taking Active   spironolactone (ALDACTONE) 25 MG tablet [id number] Take 1 tablet (25 mg total) by mouth daily. Simmons-Robinson, Makiera, MD Taking Active   torsemide (DEMADEX) 20 MG tablet [id number] Take 1 tablet (20 mg total) by mouth daily. Simmons-Robinson, Makiera, MD Taking Active   umeclidinium-vilanterol (ANORO ELLIPTA) 62.5-25 MCG/INH AEPB [id number] Inhale 1 puff into the lungs in the morning. SEulis Foster MD Taking Active             Patient Active Problem List   Diagnosis Date Noted   Annual physical exam 07/18/2020   Right heart failure ([facility] 05/03/2019   COPD (chronic obstructive pulmonary disease) ([place] 08/10/2015   Pulmonary hypertension ([facility]    HFpEF 08/08/2015   Essential hypertension    Tobacco abuse 05/16/2006   GASTROESOPHAGEAL REFLUX, NO ESOPHAGITIS 05/16/2006    Conditions to be addressed/monitored per PCP order:   chronic healthcare management needs, GERD, tobacco use, COPD, HTN, HF  Care Plan : RN Care Manager Plan of Care  Updates made by CGayla Medicus RN since 03/02/2021 12:00 AM     Problem: Chronic Disease Management and Care Coordiantion Needs   Priority: High  Onset Date: 01/31/2021     Long-Range Goal: Establish Plan of Care for Chronic Disease Management Needs   Start Date: 01/31/2021  Expected End Date: 05/03/2021  Priority: High  Note:   Current Barriers:  Knowledge Deficits related to plan of care for management of HTN, HF, COPD, tobacco use, pulmonary hypertension, chronic  hypoxic respiratory failure.  Patient currently on 2L of Oxygen at night Care Coordination needs related to dental resources Chronic Disease Management support and education needs related to HTN, HF, COPD, GERD, tobacco use history of COPD, pulmonary hypertension, chronic  hypoxic respiratory failure  03/02/21:  patient without complaint today. RNCM Clinical Goal(s):  Patient will verbalize understanding of plan for management of HTN, HF, COPD, GERD, tobacco use, pulmonary hypertension, chronic hypoxic respiratory failure verbalize basic understanding of  HTN, HF, COPD, GERD, tobacco use, pulmonary hypertension, chronic hypoxic respiratory failure  disease process and self health management plan  take all medications exactly as prescribed and will call provider for medication related questions demonstrate understanding of rationale for each prescribed medication  attend all scheduled medical appointments:  continue to work with RN Care Manager to address care management and care coordination needs related to  HTN, HF, COPD, GERD, tobacco use, pulmonary HTN, chronic hypoxic respiratory failure. work with pharmacist for medication review work with [employer] care guide to address needs related to  dental resources through collaboration with Consulting civil engineer, provider, and care team.   Interventions: Inter-disciplinary care team collaboration (see longitudinal plan of care) Evaluation of current treatment plan related to  self management and patient's adherence to plan as established by provider Pharmacy referral for medication review Collaborated with Pharmacist for medication review Collaborated with Care Guide for dental resources Care Guide referral for dental resources Assessed SDOH barriers  Patient Goals/Self-Care Activities: Patient will self administer medications as prescribed Patient will attend all scheduled provider appointments Patient will call pharmacy for medication refills Patient will continue to perform ADL's independently Patient will continue to perform IADL's independently Patient will call provider office for new concerns or questions  Follow Up Plan:  The patient has been provided with contact information for the care  management team and has been advised to call with any health related questions or concerns.  The care management team will reach out to the patient again over the next 30 days.   Evidence-based guidance:  Review biopsychosocial determinants of health screens.  Determine level of modifiable health risk.  Assess level of patient activation, level of readiness, importance and confidence to make changes.  Evoke change talk using open-ended questions, pros and cons, as well as looking forward.  Identify areas where behavior change may lead to improved health.  Partner with patient to develop a robust self-management plan that includes lifestyle factors, such as weight loss, exercise and healthy nutrition, as well as goals specific to disease risks.  Support patient and family/caregiver active participation in decision-making and self-management plan.  Implement additional goals and interventions based on identified risk factors to reduce health risk.  Facilitate advance care planning.  Review need for preventive screening based on age, sex, family history and health history.     Follow Up:  Patient agrees to Care Plan and Follow-up.  Plan: The Managed Medicaid care management team will reach out to the patient again over the next 30 days. and The  Patient has been provided with contact information for the Managed Medicaid care management team and has been advised to call with any health related questions or concerns.  Date/time of next scheduled RN care management/care coordination outreach:  03/29/21 at 1030.

## 2021-03-06 ENCOUNTER — Telehealth: Payer: Self-pay | Admitting: Family Medicine

## 2021-03-06 NOTE — Telephone Encounter (Signed)
..  Medicaid Managed Care   Unsuccessful Outreach Note  03/06/2021 Name: Joy [name] MRN: [id number] DOB: 1960/11/10  Referred by: Eulis Foster, MD Reason for referral : High Risk Managed Medicaid (Called patient to get her phone visit with the MM Pharmacist rescheduled. I left my name and number on her VM.)   An unsuccessful telephone outreach was attempted today. The patient was referred to the case management team for assistance with care management and care coordination.   Follow Up Plan: The care management team will reach out to the patient again over the next 7 days.   [place]

## 2021-03-08 ENCOUNTER — Telehealth: Payer: Self-pay

## 2021-03-08 NOTE — Telephone Encounter (Signed)
° °  Telephone encounter was:  Successful.  03/08/2021 Name: Joy [name] MRN: [id number] DOB: 03/30/1960  Joy [name] is a 60 y.o. year old female who is a primary care patient of Simmons-Robinson, Makiera, MD . The community resource team was consulted for assistance with  dental providers.  Care guide performed the following interventions:  Spoke to Joy [name] gave her the number to contact her insurance [employer] [phone]. Patient stated that she could call and have them mail her a list of approved dental providers.  Follow Up Plan:  No further follow up planned at this time. The patient has been provided with needed resources.  Juanette Urizar, AAS Paralegal, [facility]  [address], [place] [address] ??[email or web address]   ?? [phone]   [email or web address]

## 2021-03-29 ENCOUNTER — Other Ambulatory Visit: Payer: Self-pay | Admitting: Obstetrics and Gynecology

## 2021-03-29 NOTE — Patient Instructions (Signed)
Visit Information  Ms. [name]  - as a part of your Medicaid benefit, you are eligible for care management and care coordination services at no cost or copay. I was unable to reach you by phone today but would be happy to help you with your health related needs. Please feel free to call me at [phone].  A member of the Managed Medicaid care management team will reach out to you again over the next 7-14 days.   Aida Raider RN, BSN [place]   [facility] Curator - Managed Medicaid High Risk [phone].

## 2021-03-29 NOTE — Patient Outreach (Signed)
Care Coordination  03/29/2021  [name] 02-13-1961 [id number]   Medicaid Managed Care   Unsuccessful Outreach Note  03/29/2021 Name: Joy [name] MRN: [id number] DOB: 16-Sep-1960  Referred by: Eulis Foster, MD Reason for referral : High Risk Managed Medicaid (Unsuccessful telephone outreach)   An unsuccessful telephone outreach was attempted today. The patient was referred to the case management team for assistance with care management and care coordination.   Follow Up Plan: The care management team will reach out to the patient again over the next 7-14 days.   Aida Raider RN, BSN [place]   [facility] Curator - Managed Medicaid High Risk [phone].

## 2021-04-04 ENCOUNTER — Other Ambulatory Visit: Payer: Self-pay | Admitting: Obstetrics and Gynecology

## 2021-04-04 ENCOUNTER — Other Ambulatory Visit: Payer: Self-pay

## 2021-04-04 NOTE — Patient Outreach (Signed)
Medicaid Managed Care   Nurse Care Manager Note  04/04/2021 Name:  Joy [name] MRN:  [id number] DOB:  1960/08/26  Joy [name] is an 61 y.o. year old female who is a primary patient of Joy [name], Joy Sheer, MD.  The [place] team was consulted for assistance with:    Chronic healthcare management needs.  [name] was given information about Medicaid Managed Care Coordination team services today. Joy [name] Patient agreed to services and verbal consent obtained.  Engaged with patient by telephone for follow up visit in response to provider referral for case management and/or care coordination services.   Assessments/Interventions:  Review of past medical history, allergies, medications, health status, including review of consultants reports, laboratory and other test data, was performed as part of comprehensive evaluation and provision of chronic care management services.  SDOH (Social Determinants of Health) assessments and interventions performed: SDOH Interventions    Flowsheet Row Most Recent Value  SDOH Interventions   Food Insecurity Interventions Intervention Not Indicated  Transportation Interventions [facility], Other (Comment)  [patient also given [facility] phone number]       Care Plan  No Known Allergies  Medications Reviewed Today     Reviewed by Joy Medicus, RN (Registered Nurse) on 04/04/21 at 1252  Med List Status: <None>   Medication Order Taking? Sig Documenting Provider Last Dose Status Informant  acetaZOLAMIDE (DIAMOX) 250 MG tablet [id number] No Take 1 tablet (250 mg total) by mouth daily. Joy [name], Makiera, MD Taking Active   albuterol (VENTOLIN HFA) 108 (90 Base) MCG/ACT inhaler [id number]  INHALE 2 PUFFS INTO THE LUNGS EVERY 4 HOURS AS NEEDED FOR WHEEZING OR SHORTNESS OF BREATH. Joy [name], Joy Gains, MD  Active   Multiple Vitamin (MULTIVITAMIN WITH MINERALS) TABS tablet [id number] No  Take 1 tablet by mouth daily. [provider] Taking Active Self  rosuvastatin (CRESTOR) 10 MG tablet [id number] No Take 1 tablet (10 mg total) by mouth daily. Increase to 52m after one week if tolerating well. Joy [name], Makiera, MD Taking Active   spironolactone (ALDACTONE) 25 MG tablet [id number] Take 1 tablet (25 mg total) by mouth daily. Joy [name], Makiera, MD Taking Active   torsemide (DEMADEX) 20 MG tablet [id number] Take 1 tablet (20 mg total) by mouth daily. Joy [name], Makiera, MD Taking Active   umeclidinium-vilanterol (ANORO ELLIPTA) 62.5-25 MCG/INH AEPB [id number] Inhale 1 puff into the lungs in the morning. Joy Foster MD Taking Active             Patient Active Problem List   Diagnosis Date Noted   Annual physical exam 07/18/2020   Right heart failure ([place] 05/03/2019   COPD (chronic obstructive pulmonary disease) ([place] 08/10/2015   Pulmonary hypertension ([facility]    HFpEF 08/08/2015   Essential hypertension    Tobacco abuse 05/16/2006   GASTROESOPHAGEAL REFLUX, NO ESOPHAGITIS 05/16/2006    Conditions to be addressed/monitored per PCP order:   chronic healthcare management needs, HTN, HF, COPD, GERD, tobacco use  Care Plan : RN Care Manager Plan of Care  Updates made by CGayla Medicus RN since 04/04/2021 12:00 AM     Problem: Chronic Disease Management and Care Coordiantion Needs   Priority: High  Onset Date: 01/31/2021     Long-Range Goal: Establish Plan of Care for Chronic Disease Management Needs   Start Date: 01/31/2021  Expected End Date: 07/03/2021  Priority: High  Note:   Current Barriers:  Knowledge Deficits related to plan of care for  management of HTN, HF, COPD, tobacco use, pulmonary hypertension, chronic hypoxic respiratory failure.  Patient currently on 2L of Oxygen at night Care Coordination needs related to dental resources Chronic Disease Management support and education needs related to HTN, HF,  COPD, GERD, tobacco use history of COPD, pulmonary hypertension, chronic hypoxic respiratory failure  04/04/21:  Patient with no complaints today-to call and schedule appt with PCP-given phone number.  [facility]  Patient given phone numbers for [employer] and [place] if needed-states son no longer has a car-uses bus and taxi as needed also. RNCM Clinical Goal(s):  Patient will verbalize understanding of plan for management of HTN, HF, COPD, GERD, tobacco use, pulmonary hypertension, chronic hypoxic respiratory failure verbalize basic understanding of  HTN, HF, COPD, GERD, tobacco use, pulmonary hypertension, chronic hypoxic respiratory failure  disease process and self health management plan  take all medications exactly as prescribed and will call provider for medication related questions demonstrate understanding of rationale for each prescribed medication  attend all scheduled medical appointments:  continue to work with RN Care Manager to address care management and care coordination needs related to  HTN, HF, COPD, GERD, tobacco use, pulmonary HTN, chronic hypoxic respiratory failure. work with pharmacist for medication review work with Joy [name] care guide to address needs related to  dental resources through collaboration with Consulting civil engineer, provider, and care team.   Interventions: Inter-disciplinary care team collaboration (see longitudinal plan of care) Evaluation of current treatment plan related to  self management and patient's adherence to plan as established by provider Pharmacy referral for medication review Collaborated with Pharmacist for medication review Collaborated with Care Guide for dental resources Care Guide referral for dental resources-completed. Assessed SDOH barriers  Patient Goals/Self-Care Activities: Patient will self administer medications as  prescribed Patient will attend all scheduled provider appointments Patient will call pharmacy for medication refills Patient will continue to perform ADL's independently Patient will continue to perform IADL's independently Patient will call provider office for new concerns or questions  Follow Up Plan:  The patient has been provided with contact information for the care management team and has been advised to call with any health related questions or concerns.  The care management team will reach out to the patient again over the next 30 days.   Evidence-based guidance:  Review biopsychosocial determinants of health screens.  Determine level of modifiable health risk.  Assess level of patient activation, level of readiness, importance and confidence to make changes.  Evoke change talk using open-ended questions, pros and cons, as well as looking forward.  Identify areas where behavior change may lead to improved health.  Partner with patient to develop a robust self-management plan that includes lifestyle factors, such as weight loss, exercise and healthy nutrition, as well as goals specific to disease risks.  Support patient and family/caregiver active participation in decision-making and self-management plan.  Implement additional goals and interventions based on identified risk factors to reduce health risk.  Facilitate advance care planning.  Review need for preventive screening based on age, sex, family history and health history.     Follow Up:  Patient agrees to Care Plan and Follow-up.  Plan: The Managed Medicaid care management team will reach out to the patient again over the next 30 days. and The  Patient has been provided with contact information for the Managed Medicaid care management team and has been advised to call with any health related  questions or concerns.  Date/time of next scheduled RN care management/care coordination outreach:  05/05/21 at 1230

## 2021-04-04 NOTE — Patient Instructions (Addendum)
Visit Information  Ms. Farewell was given information about Medicaid Managed Care team care coordination services as a part of their [place] Medicaid benefit. [name] verbally consented to engagement with the [facility] Managed Care team.   If you are experiencing a medical emergency, please call 911 or report to your local emergency department or urgent care.   If you have a non-emergency medical problem during routine business hours, please contact your provider's office and ask to speak with a nurse.   For questions related to your [facility], please call: [phone] or visit the homepage here: [email or web address]  If you would like to schedule transportation through your [facility], please call the following number at least 2 days in advance of your appointment: [phone].   Call the [place] at [phone], at any time, 24 hours a day, 7 days a week. If you are in danger or need immediate medical attention call 911.  If you would like help to quit smoking, call 1-800-QUIT-NOW 479-845-7740) OR Espaol: 1-855-Djelo-Ya (5-852-778-2423) o para ms informacin haga clic aqu or Text READY to 200-400 to register via text  Ms. [place] - following are the goals we discussed in your visit today:   Goals Addressed             This Visit's Progress    Protect My Health       Timeframe:  Long-Range Goal Priority:  High Start Date:    01/31/21                         Expected End Date:     ongoing                  Follow Up Date 05/05/21   - schedule appointment for flu shot - schedule appointment for vaccines needed due to my age or health - schedule recommended health tests (blood work, mammogram, colonoscopy, pap test) - schedule and keep appointment for annual check-up    Why is this important?   Screening tests can find  diseases early when they are easier to treat.  Your doctor or nurse will talk with you about which tests are important for you.  Getting shots for common diseases like the flu and shingles will help prevent them.     04/04/21:  patient to call and schedule PCP appointment.    The patient verbalized understanding of instructions provided today and declined a print copy of patient instruction materials.   The Managed Medicaid care management team will reach out to the patient again over the next 30 days.  The  Patient  has been provided with contact information for the Managed Medicaid care management team and has been advised to call with any health related questions or concerns.   Aida Raider RN, BSN [place]   [facility] Curator - Managed Medicaid High Risk [phone].   Following is a copy of your plan of care:  Care Plan : [place] of Care  Updates made by Gayla Medicus, RN since 04/04/2021 12:00 AM     Problem: Chronic Disease Management and Care Coordiantion Needs   Priority: High  Onset Date: 01/31/2021     Long-Range Goal: Establish Plan of Care for Chronic Disease Management Needs   Start Date: 01/31/2021  Expected End Date: 07/03/2021  Priority: High  Note:   Current  Barriers:  Knowledge Deficits related to plan of care for management of HTN, HF, COPD, tobacco use, pulmonary hypertension, chronic hypoxic respiratory failure.  Patient currently on 2L of Oxygen at night Care Coordination needs related to dental resources Chronic Disease Management support and education needs related to HTN, HF, COPD, GERD, tobacco use history of COPD, pulmonary hypertension, chronic hypoxic respiratory failure  04/04/21:  Patient with no complaints today-to call and schedule appt with PCP-given phone number.  [facility]  Patient given phone numbers for [employer] and  [place] if needed-states son no longer has a car-uses bus and taxi as needed also. RNCM Clinical Goal(s):  Patient will verbalize understanding of plan for management of HTN, HF, COPD, GERD, tobacco use, pulmonary hypertension, chronic hypoxic respiratory failure verbalize basic understanding of  HTN, HF, COPD, GERD, tobacco use, pulmonary hypertension, chronic hypoxic respiratory failure  disease process and self health management plan  take all medications exactly as prescribed and will call provider for medication related questions demonstrate understanding of rationale for each prescribed medication  attend all scheduled medical appointments:  continue to work with RN Care Manager to address care management and care coordination needs related to  HTN, HF, COPD, GERD, tobacco use, pulmonary HTN, chronic hypoxic respiratory failure. work with pharmacist for medication review work with [employer] care guide to address needs related to  dental resources through collaboration with Consulting civil engineer, provider, and care team.   Interventions: Inter-disciplinary care team collaboration (see longitudinal plan of care) Evaluation of current treatment plan related to  self management and patient's adherence to plan as established by provider Pharmacy referral for medication review Collaborated with Pharmacist for medication review Collaborated with Care Guide for dental resources Care Guide referral for dental resources-completed. Assessed SDOH barriers  Patient Goals/Self-Care Activities: Patient will self administer medications as prescribed Patient will attend all scheduled provider appointments Patient will call pharmacy for medication refills Patient will continue to perform ADL's independently Patient will continue to perform IADL's independently Patient will call provider office for new concerns or questions  Follow Up Plan:  The patient has been provided with  contact information for the care management team and has been advised to call with any health related questions or concerns.  The care management team will reach out to the patient again over the next 30 days.   Evidence-based guidance:  Review biopsychosocial determinants of health screens.  Determine level of modifiable health risk.  Assess level of patient activation, level of readiness, importance and confidence to make changes.  Evoke change talk using open-ended questions, pros and cons, as well as looking forward.  Identify areas where behavior change may lead to improved health.  Partner with patient to develop a robust self-management plan that includes lifestyle factors, such as weight loss, exercise and healthy nutrition, as well as goals specific to disease risks.  Support patient and family/caregiver active participation in decision-making and self-management plan.  Implement additional goals and interventions based on identified risk factors to reduce health risk.  Facilitate advance care planning.  Review need for preventive screening based on age, sex, family history and health history.

## 2021-04-14 ENCOUNTER — Ambulatory Visit: Payer: Self-pay

## 2021-04-14 ENCOUNTER — Telehealth: Payer: Self-pay | Admitting: Pharmacist

## 2021-04-14 ENCOUNTER — Telehealth: Payer: Self-pay | Admitting: Pulmonary Disease

## 2021-04-14 MED ORDER — ANORO ELLIPTA 62.5-25 MCG/ACT IN AEPB: 1.0000 | INHALATION_SPRAY | Freq: Every day | RESPIRATORY_TRACT | 5 refills | Status: DC

## 2021-04-14 MED ORDER — ALBUTEROL SULFATE HFA 108 (90 BASE) MCG/ACT IN AERS: 2.0000 | INHALATION_SPRAY | RESPIRATORY_TRACT | 3 refills | Status: DC | PRN

## 2021-04-14 NOTE — Telephone Encounter (Signed)
Rx for pt's albuterol and anoro inhalers have been sent to preferred pharmacy for pt. Called and spoke with pt letting her know this had been done and she verbalized understanding. Nothing further needed.

## 2021-04-14 NOTE — Patient Outreach (Signed)
04/14/2021 Name: Joy [name] MRN: [id number] DOB: [birth year]  Referred by: Eulis Foster, MD Reason for referral : No chief complaint on file.   An unsuccessful telephone outreach was attempted today. The patient was referred to the case management team for assistance with care management and care coordination.    Follow Up Plan: The Managed Medicaid care management team will reach out to the patient again over the next 10 days.   Hughes Better PharmD, CPP High Risk Managed Medicaid [place] [phone]

## 2021-04-20 NOTE — Progress Notes (Deleted)
° ° °  SUBJECTIVE:   CHIEF COMPLAINT / HPI:   History of pulmonary hypertension: Continues to follow with pulmonology. Has upcoming appointment in May.   Hypertension: Current blood pressure medications include spironolactone 25 mg daily, she also takes torsemide 20 mg daily  PERTINENT  PMH / PSH: Hypertension  OBJECTIVE:   There were no vitals taken for this visit. ***  General: NAD, pleasant, able to participate in exam Cardiac: RRR, no murmurs. Respiratory: CTAB, normal effort, No wheezes, rales or rhonchi Abdomen: Bowel sounds present, nontender, nondistended, no hepatosplenomegaly. Extremities: no edema or cyanosis. Skin: warm and dry, no rashes noted Neuro: alert, no obvious focal deficits Psych: Normal affect and mood  ASSESSMENT/PLAN:   No problem-specific Assessment & Plan notes found for this encounter.   BP today of***.  Continues on spironolactone 25 mg daily and torsemide 20 mg daily.  We will check a BMP today to monitor renal function.  History of pulmonary hypertension: Continues to follow with pulmonology and has an upcoming appointment with them in May.  Health maintenance: Recommended colonoscopy per guidelines***  Lurline Del, [place]    {    This will disappear when note is signed, click to select method of visit    :1}

## 2021-04-21 ENCOUNTER — Ambulatory Visit: Payer: Medicaid Other

## 2021-04-25 NOTE — Progress Notes (Signed)
SUBJECTIVE:   Chief compliant/HPI: annual examination  CHELLY DOMBECK is a 61 y.o. with PMHx of HFpEF, pulmonary HTN, COPD GOLD C on 2L of O2 at night, tobacco abuse, GERD who presents today for an annual exam. Still smoking, states a pack lasts her 4 days. Smoking for about 20-30 years.  Current concerns: None  History tabs reviewed and updated.   Review of systems form reviewed and notable for negative for CP, abdominal pain, SOB, hematuria, hematochezia.   OBJECTIVE:   BP 105/75    Pulse 78    Ht 5' 6" (1.676 m)    Wt 168 lb 6.4 oz (76.4 kg)    SpO2 94%    BMI 27.18 kg/m   General: Awake, alert, oriented, in no acute distress, pleasant and cooperative with examination HEENT: Normocephalic, atraumatic, nares patent, dentition is good, oropharynx without erythema or exudates, TM's clear bilaterally, no thyroid nodules palpated Cardio: RRR without murmur, 2+ radial, DP and PT pulses b/l Respiratory: CTAB without wheezing/rhonchi/rales Abdomen: Soft, non-tender to palpation of all quadrants, non-distended, no rebound/guarding, no organomegaly MSK: Able to move all extremities spontaneously, good muscle strength, no abnormalities Extremities: without edema or cyanosis Neuro: Speech is clear and intact, no focal deficits, no facial asymmetry, follows commands  Psych: Normal mood and affect  Depression screen [facility] 2/9 07/18/2020 07/10/2019 05/13/2019  Decreased Interest 0 0 0  Down, Depressed, Hopeless 0 0 0  PHQ - 2 Score 0 0 0  Altered sleeping 0 - -  Tired, decreased energy 0 - -  Change in appetite 0 - -  Feeling bad or failure about yourself  0 - -  Trouble concentrating 0 - -  PHQ-9 Score 0 - -     ASSESSMENT/PLAN:   Tobacco abuse Amenable to starting nicotine gum today.  Also provided with information for smoking cessation hotline.  Up-to-date on lung cancer screenings.  Pulmonary hypertension ([place]) On multiple diuretics. Last potassium low in 08/2019. Will check BMP  today.  Side effect of medication Reported intolerance to rosuvastatin, felt that she was allergic to it.  No symptoms concerning for anaphylaxis.  Amenable to trying atorvastatin to see if better able to tolerate.    Annual Examination  See AVS for age appropriate recommendations  PHQ score 0, reviewed and discussed.  BP reviewed and at goal.  Asked about intimate partner violence and resources given as appropriate  Advance directives discussion: Yes, provided with advanced directives packet.   Considered the following items based upon USPSTF recommendations: Diabetes screening: discussed and ordered Screening for elevated cholesterol:  Last lipid panel obtained 07/2020, LDL 162, total cholesterol 272 HIV testing: discussed and declined Hepatitis C: discussed and declined Hepatitis B: discussed and declined Syphilis if at high risk: discussed and declined GC/CT not at high risk and not ordered. Osteoporosis screening considered based upon risk of fracture from [facility] calculator. Major osteoporotic fracture risk is 27.3%. DEXA ordered.  Reviewed risk factors for latent tuberculosis and not indicated  Discussed family history, BRCA testing not indicated. Tool used to risk stratify was Pedigree Risk Assessment.  Cervical cancer screening: prior Pap reviewed, repeat due in 07/2025 Breast cancer screening:  Mammo 12/2020 with probable benign grouped calcifications. Had U/S guided biopsy on 10/27 which were negative. Recommendation is for repeat diagnostic b/l mammogram in 6 months (April)  Colorectal cancer screening: discussed options, elected for cologuard Lung cancer screening:  last obtained 07/2020, Lung RADS 2 . See documentation below regarding indications/risks/benefits.  Vaccinations  Due for shingles, flu, Tdap, COVID booster. Received today without complications. Shingrix vaccine sent to preferred pharmacy.   Follow up in 1 year or sooner if indicated.    Sharion Settler,  [place]

## 2021-04-25 NOTE — Patient Instructions (Addendum)
It was wonderful to see you today.  Please bring ALL of your medications with you to every visit.   Today we talked about:  -You are due for colon cancer screening. I have ordered a Cologuard.  -We are checking you for diabetes today. -We are starting a new medication for your cholesterol called atorvastatin.  -I have sent in a prescription for the shingles vaccine.  -I have provided you with Advanced Directive paperwork. It is good to think about these things while we are healthy and well! This will need to be notarized. Please bring the forms back after they have been completed. -I commend you on wanting to stop smoking. I have sent the nicotine gum to your pharmacy. Please use them how we discussed today.  Tobacco use is damaging to your body. It increases your risk of stroke, heart attack, lung cancer, and serious lung disease in the future. It also reduces your fertility.   Quitting tobacco is the best thing for your health but is a challenge---nicotine, a chemical in cigarettes, is highly addictive.   You can call 1 800 QUIT NOW (1-661-275-1177)---you will be connected with a Artist. They can also mail you nicotine gums, lozenges, and patches to quit.    Thank you for choosing [place].   Please call [phone] with any questions about today's appointment.  Please be sure to schedule follow up at the front  desk before you leave today.   Sharion Settler, DO PGY-2 Family Medicine

## 2021-04-26 ENCOUNTER — Other Ambulatory Visit: Payer: Self-pay

## 2021-04-26 ENCOUNTER — Ambulatory Visit (INDEPENDENT_AMBULATORY_CARE_PROVIDER_SITE_OTHER): Payer: Medicaid Other | Admitting: Family Medicine

## 2021-04-26 ENCOUNTER — Ambulatory Visit (INDEPENDENT_AMBULATORY_CARE_PROVIDER_SITE_OTHER): Payer: Medicaid Other

## 2021-04-26 VITALS — BP 105/75 | HR 78 | Ht 66.0 in | Wt 168.4 lb

## 2021-04-26 DIAGNOSIS — I272 Pulmonary hypertension, unspecified: Secondary | ICD-10-CM | POA: Diagnosis not present

## 2021-04-26 DIAGNOSIS — Z23 Encounter for immunization: Secondary | ICD-10-CM

## 2021-04-26 DIAGNOSIS — Z13820 Encounter for screening for osteoporosis: Secondary | ICD-10-CM | POA: Diagnosis not present

## 2021-04-26 DIAGNOSIS — Z131 Encounter for screening for diabetes mellitus: Secondary | ICD-10-CM | POA: Diagnosis not present

## 2021-04-26 DIAGNOSIS — Z0000 Encounter for general adult medical examination without abnormal findings: Secondary | ICD-10-CM

## 2021-04-26 DIAGNOSIS — T887XXA Unspecified adverse effect of drug or medicament, initial encounter: Secondary | ICD-10-CM

## 2021-04-26 DIAGNOSIS — Z720 Tobacco use: Secondary | ICD-10-CM | POA: Diagnosis not present

## 2021-04-26 DIAGNOSIS — Z1211 Encounter for screening for malignant neoplasm of colon: Secondary | ICD-10-CM | POA: Diagnosis not present

## 2021-04-26 LAB — POCT GLYCOSYLATED HEMOGLOBIN (HGB A1C): Hemoglobin A1C: 5.5 % (ref 4.0–5.6)

## 2021-04-26 MED ORDER — ZOSTER VAC RECOMB ADJUVANTED 50 MCG/0.5ML IM SUSR: 0.5000 mL | Freq: Once | INTRAMUSCULAR | 0 refills | Status: AC

## 2021-04-26 MED ORDER — ATORVASTATIN CALCIUM 20 MG PO TABS: 20.0000 mg | ORAL_TABLET | Freq: Every day | ORAL | 3 refills | Status: DC

## 2021-04-26 MED ORDER — NICOTINE POLACRILEX 2 MG MT GUM: 2.0000 mg | CHEWING_GUM | OROMUCOSAL | 0 refills | Status: DC | PRN

## 2021-04-26 NOTE — Assessment & Plan Note (Signed)
Reported intolerance to rosuvastatin, felt that she was allergic to it.  No symptoms concerning for anaphylaxis.  Amenable to trying atorvastatin to see if better able to tolerate.

## 2021-04-26 NOTE — Assessment & Plan Note (Signed)
Amenable to starting nicotine gum today.  Also provided with information for smoking cessation hotline.  Up-to-date on lung cancer screenings.

## 2021-04-26 NOTE — Assessment & Plan Note (Addendum)
On multiple diuretics. Last potassium low in 08/2019. Will check BMP today.

## 2021-04-27 LAB — BASIC METABOLIC PANEL
BUN/Creatinine Ratio: 15 (ref 12–28)
BUN: 13 mg/dL (ref 8–27)
CO2: 30 mmol/L — ABNORMAL HIGH (ref 20–29)
Calcium: 10.1 mg/dL (ref 8.7–10.3)
Chloride: 92 mmol/L — ABNORMAL LOW (ref 96–106)
Creatinine, Ser: 0.86 mg/dL (ref 0.57–1.00)
Glucose: 90 mg/dL (ref 70–99)
Potassium: 4.3 mmol/L (ref 3.5–5.2)
Sodium: 139 mmol/L (ref 134–144)
eGFR: 76 mL/min/{1.73_m2} (ref >59)

## 2021-05-05 ENCOUNTER — Other Ambulatory Visit: Payer: Self-pay

## 2021-05-05 ENCOUNTER — Other Ambulatory Visit: Payer: Self-pay | Admitting: Obstetrics and Gynecology

## 2021-05-05 NOTE — Patient Outreach (Signed)
Medicaid Managed Care   Nurse Care Manager Note  05/05/2021 Name:  Joy [name] MRN:  [id number] DOB:  03-24-1960  Joy [name] is an 61 y.o. year old female who is a primary patient of Simmons-Robinson, Riki Sheer, MD.  The [facility] Managed Care Coordination team was consulted for assistance with:    Chronic healthcare management needs, HTN, HF, COPD, GERD, tobacco use  Ms. Terrio was given information about Medicaid Managed Care Coordination team services today. Joy [name] Patient agreed to services and verbal consent obtained.  Engaged with patient by telephone for follow up visit in response to provider referral for case management and/or care coordination services.   Assessments/Interventions:  Review of past medical history, allergies, medications, health status, including review of consultants reports, laboratory and other test data, was performed as part of comprehensive evaluation and provision of chronic care management services.  SDOH (Social Determinants of Health) assessments and interventions performed:   Care Plan  No Known Allergies  Medications Reviewed Today     Reviewed by Gayla Medicus, RN (Registered Nurse) on 05/05/21 at 1238  Med List Status: <None>   Medication Order Taking? Sig Documenting Provider Last Dose Status Informant  acetaZOLAMIDE (DIAMOX) 250 MG tablet [id number] No Take 1 tablet (250 mg total) by mouth daily. Simmons-Robinson, Makiera, MD Taking Active   albuterol (VENTOLIN HFA) 108 (90 Base) MCG/ACT inhaler [id number] No Inhale 2 puffs into the lungs every 4 (four) hours as needed for wheezing or shortness of breath. Hunsucker, Bonna Gains, MD Taking Active   atorvastatin (LIPITOR) 20 MG tablet [id number]  Take 1 tablet (20 mg total) by mouth daily. Sharion Settler, DO  Active   Multiple Vitamin (MULTIVITAMIN WITH MINERALS) TABS tablet [id number] No Take 1 tablet by mouth daily. [provider] Taking Active Self  nicotine polacrilex  (NICORETTE) 2 MG gum [id number]  Take 1 each (2 mg total) by mouth as needed for smoking cessation. Sharion Settler, DO  Active   spironolactone (ALDACTONE) 25 MG tablet [id number] No Take 1 tablet (25 mg total) by mouth daily. Simmons-Robinson, Makiera, MD Taking Active   torsemide (DEMADEX) 20 MG tablet [id number] No Take 1 tablet (20 mg total) by mouth daily. Simmons-Robinson, Makiera, MD Taking Active   umeclidinium-vilanterol (ANORO ELLIPTA) 62.5-25 MCG/INH AEPB [id number] No Inhale 1 puff into the lungs in the morning. Eulis Foster, MD Taking Active             Patient Active Problem List   Diagnosis Date Noted   Side effect of medication 04/26/2021   Annual physical exam 07/18/2020   Right heart failure ([place]) 05/03/2019   COPD (chronic obstructive pulmonary disease) ([place]) 08/10/2015   Pulmonary hypertension ([place])    HFpEF 08/08/2015   Essential hypertension    Tobacco abuse 05/16/2006   GASTROESOPHAGEAL REFLUX, NO ESOPHAGITIS 05/16/2006    Conditions to be addressed/monitored per PCP order:  Chronic healthcare management needs, HTN, HF, COPD, GERD, tobacco use  Care Plan : RN Care Manager Plan of Care  Updates made by Gayla Medicus, RN since 05/05/2021 12:00 AM     Problem: Chronic Disease Management and Care Coordiantion Needs   Priority: High  Onset Date: 01/31/2021     Long-Range Goal: Establish Plan of Care for Chronic Disease Management Needs   Start Date: 01/31/2021  Expected End Date: 07/03/2021  Priority: High  Note:   Current Barriers:  Knowledge Deficits related to plan of care for management of chronic disease. Patient currently on 2L of  Oxygen at night.  Smokes 1pack every 8 days-to start Nicorette gum. Chronic Disease Management support and education needs related to tobacco use. 05/05/21:  Patient seen and evaluated by PCP 04/26/21, has appt with PULM 5/23.  To start Nicorette gum. RNCM Clinical Goal(s):  Patient will verbalize understanding  of plan for management of HTN, HF, COPD, GERD, tobacco use, pulmonary hypertension, chronic hypoxic respiratory failure verbalize basic understanding of  HTN, HF, COPD, GERD, tobacco use, pulmonary hypertension, chronic hypoxic respiratory failure  disease process and self health management plan  take all medications exactly as prescribed and will call provider for medication related questions demonstrate understanding of rationale for each prescribed medication  attend all scheduled medical appointments:  continue to work with RN Care Manager to address care management and care coordination needs related to  HTN, HF, COPD, GERD, tobacco use, pulmonary HTN, chronic hypoxic respiratory failure. work with pharmacist for medication review work with [employer] care guide to address needs related to  dental resources through collaboration with Consulting civil engineer, provider, and care team.   Interventions: Inter-disciplinary care team collaboration (see longitudinal plan of care) Evaluation of current treatment plan related to  self management and patient's adherence to plan as established by provider Pharmacy referral for medication review Collaborated with Pharmacist for medication review Collaborated with Care Guide for dental resources Care Guide referral for dental resources-completed. Assessed SDOH barriers  Patient Goals/Self-Care Activities: Patient will self administer medications as prescribed Patient will attend all scheduled provider appointments Patient will call pharmacy for medication refills Patient will continue to perform ADL's independently Patient will continue to perform IADL's independently Patient will call provider office for new concerns or questions  Follow Up Plan:  The patient has been provided with contact information for the care management team and has been advised to call with any health related questions or concerns.  The care management team will reach out  to the patient again over the next 30 days.    Follow Up:  Patient agrees to Care Plan and Follow-up.  Plan: The Managed Medicaid care management team will reach out to the patient again over the next 30 days. and The  Patient has been provided with contact information for the Managed Medicaid care management team and has been advised to call with any health related questions or concerns.  Date/time of next scheduled RN care management/care coordination outreach:  06/02/21 at 1230.

## 2021-05-05 NOTE — Patient Instructions (Signed)
Visit Information  Joy [name] was given information about Medicaid Managed Care team care coordination services as a part of their [place] Medicaid benefit. Joy [name] verbally consented to engagement with the [facility] Managed Care team.   If you are experiencing a medical emergency, please call 911 or report to your local emergency department or urgent care.   If you have a non-emergency medical problem during routine business hours, please contact your provider's office and ask to speak with a nurse.   For questions related to your [facility], please call: [phone] or visit the homepage here: [email or web address]  If you would like to schedule transportation through your [facility], please call the following number at least 2 days in advance of your appointment: [phone].  Rides for urgent appointments can also be made after hours by calling Member Services.  Call the [facility] at [phone], at any time, 24 hours a day, 7 days a week. If you are in danger or need immediate medical attention call 911.  If you would like help to quit smoking, call 1-800-QUIT-NOW 864-552-0733) OR Espaol: 1-855-Djelo-Ya (4-132-440-1027) o para ms informacin haga clic aqu or Text READY to 200-400 to register via text  Joy [name] - following are the goals we discussed in your visit today:   Goals Addressed             This Visit's Progress    Protect My Health       Timeframe:  Long-Range Goal Priority:  High Start Date:    01/31/21                         Expected End Date:     ongoing                  Follow Up Date 06/02/21   - schedule appointment for flu shot - schedule appointment for vaccines needed due to my age or health - schedule recommended health tests (blood work, mammogram, colonoscopy, pap test) - schedule and keep  appointment for annual check-up    Why is this important?   Screening tests can find diseases early when they are easier to treat.  Your doctor or nurse will talk with you about which tests are important for you.  Getting shots for common diseases like the flu and shingles will help prevent them.     05/05/21:  patient seen and evaluated by PCP 04/26/21-has PULM appt in May.   The patient verbalized understanding of instructions, educational materials, and care plan provided today and declined offer to receive copy of patient instructions, educational materials, and care plan.   The Managed Medicaid care management team will reach out to the patient again over the next 30 days.  The  Patient  has been provided with contact information for the Managed Medicaid care management team and has been advised to call with any health related questions or concerns.   Joy Raider RN, BSN [place] Management Coordinator - Managed Medicaid High Risk [phone]   Following is a copy of your plan of care:  Care Plan : [place] of Care  Updates made by Gayla Medicus, RN since 05/05/2021 12:00 AM     Problem: Chronic Disease Management and Care Coordiantion Needs   Priority: High  Onset Date: 01/31/2021     Long-Range Goal: Establish  Plan of Care for Chronic Disease Management Needs   Start Date: 01/31/2021  Expected End Date: 07/03/2021  Priority: High  Note:   Current Barriers:  Knowledge Deficits related to plan of care for management of chronic disease. Patient currently on 2L of Oxygen at night.  Smokes 1pack every 8 days-to start Nicorette gum. Chronic Disease Management support and education needs related to tobacco use. 05/05/21:  Patient seen and evaluated by PCP 04/26/21, has appt with PULM 5/23.  To start Nicorette gum. RNCM Clinical Goal(s):  Patient will verbalize understanding of plan for management of HTN, HF, COPD, GERD, tobacco use,  pulmonary hypertension, chronic hypoxic respiratory failure verbalize basic understanding of  HTN, HF, COPD, GERD, tobacco use, pulmonary hypertension, chronic hypoxic respiratory failure  disease process and self health management plan  take all medications exactly as prescribed and will call provider for medication related questions demonstrate understanding of rationale for each prescribed medication  attend all scheduled medical appointments:  continue to work with RN Care Manager to address care management and care coordination needs related to  HTN, HF, COPD, GERD, tobacco use, pulmonary HTN, chronic hypoxic respiratory failure. work with pharmacist for medication review work with [employer] care guide to address needs related to  dental resources through collaboration with Consulting civil engineer, provider, and care team.   Interventions: Inter-disciplinary care team collaboration (see longitudinal plan of care) Evaluation of current treatment plan related to  self management and patient's adherence to plan as established by provider Pharmacy referral for medication review Collaborated with Pharmacist for medication review Collaborated with Care Guide for dental resources Care Guide referral for dental resources-completed. Assessed SDOH barriers  Patient Goals/Self-Care Activities: Patient will self administer medications as prescribed Patient will attend all scheduled provider appointments Patient will call pharmacy for medication refills Patient will continue to perform ADL's independently Patient will continue to perform IADL's independently Patient will call provider office for new concerns or questions  Follow Up Plan:  The patient has been provided with contact information for the care management team and has been advised to call with any health related questions or concerns.  The care management team will reach out to the patient again over the next 30 days.

## 2021-05-08 ENCOUNTER — Other Ambulatory Visit: Payer: Self-pay

## 2021-05-10 MED ORDER — ACETAZOLAMIDE 250 MG PO TABS
250.0000 mg | ORAL_TABLET | Freq: Every day | ORAL | 0 refills | Status: DC
Start: 2021-05-10 — End: 2021-07-07

## 2021-05-15 ENCOUNTER — Other Ambulatory Visit: Payer: Self-pay

## 2021-05-15 ENCOUNTER — Other Ambulatory Visit: Payer: Medicaid Other

## 2021-05-15 NOTE — Patient Outreach (Signed)
Care Coordination  05/15/2021  Joy [name] 04-08-1960 [id number]  RNCM returned patient's phone call.  Patient states her insurance is not paying for her medications at the pharmacy.  RNCM called pharmacy for patient and they stated patient needs to call [place] Medicaid to let them know she doesn't have any other insurance so that block can be removed and medications covered.  Patient states she will call [place] Medicaid now.  Aida Raider RN, BSN [place]   [facility] - Managed Medicaid High Risk [phone]

## 2021-05-18 ENCOUNTER — Other Ambulatory Visit: Payer: Self-pay

## 2021-05-19 MED ORDER — SPIRONOLACTONE 25 MG PO TABS: 25.0000 mg | ORAL_TABLET | Freq: Every day | ORAL | 1 refills | Status: DC

## 2021-05-19 MED ORDER — TORSEMIDE 20 MG PO TABS: 20.0000 mg | ORAL_TABLET | Freq: Every day | ORAL | 1 refills | Status: DC

## 2021-06-02 ENCOUNTER — Other Ambulatory Visit: Payer: Self-pay | Admitting: Obstetrics and Gynecology

## 2021-06-02 NOTE — Patient Outreach (Signed)
Care Coordination ? ?06/02/2021 ? ?[name] ?1960-12-07 ?[id number] ? ? ?Medicaid Managed Care  ? ?Unsuccessful Outreach Note ? ?06/02/2021 ?Name: Joy [name] MRN: [id number] DOB: January 13, 1961 ? ?Referred by: Eulis Foster, MD ?Reason for referral : High Risk Managed Medicaid (Unsuccessful telephone outreach) ? ? ?An unsuccessful telephone outreach was attempted today. The patient was referred to the case management team for assistance with care management and care coordination.  ? ?Follow Up Plan: The care management team will reach out to the patient again over the next 7-14 business days.  ? ?Aida Raider RN, BSN ?[facility] ?Care Management Coordinator - Managed Medicaid High Risk ?[phone] ?  ? ? ?

## 2021-06-02 NOTE — Patient Instructions (Signed)
Visit Information ? ?Ms. [name]  - as a part of your Medicaid benefit, you are eligible for care management and care coordination services at no cost or copay. I was unable to reach you by phone today but would be happy to help you with your health related needs. Please feel free to call me at [phone] ? ?A member of the Managed Medicaid care management team will reach out to you again over the next 7-14 business  days.  ? ?Aida Raider RN, BSN ?[facility] ?Care Management Coordinator - Managed Medicaid High Risk ?[phone] ?  ?

## 2021-06-06 ENCOUNTER — Other Ambulatory Visit: Payer: Self-pay | Admitting: Family Medicine

## 2021-06-06 DIAGNOSIS — R921 Mammographic calcification found on diagnostic imaging of breast: Secondary | ICD-10-CM

## 2021-06-09 ENCOUNTER — Other Ambulatory Visit: Payer: Self-pay | Admitting: Obstetrics and Gynecology

## 2021-06-09 NOTE — Patient Instructions (Signed)
Visit Information ? ?[name]  - as a part of your Medicaid benefit, you are eligible for care management and care coordination services at no cost or copay. I was unable to reach you by phone today but would be happy to help you with your health related needs. Please feel free to call me at [phone] ? ?A member of the Managed Medicaid care management team will reach out to you again over the next 7-14 business days.  ? ?Aida Raider RN, BSN ?[facility] ?Care Management Coordinator - Managed Medicaid High Risk ?[phone] ?  ?

## 2021-06-09 NOTE — Patient Outreach (Signed)
Care Coordination ? ?06/09/2021 ? ?[name] ?03/01/1961 ?[id number] ? ? ?Medicaid Managed Care  ? ?Unsuccessful Outreach Note ? ?06/09/2021 ?Name: Joy [name] MRN: [id number] DOB: 1960-11-11 ? ?Referred by: Eulis Foster, MD ?Reason for referral : High Risk Managed Medicaid (Unsuccessful telephone outreach) ? ? ?A second unsuccessful telephone outreach was attempted today. The patient was referred to the case management team for assistance with care management and care coordination.  ? ?Follow Up Plan: The care management team will reach out to the patient again over the next 7-14 business  days.  ? ?Aida Raider RN, BSN ?[facility] ?Care Management Coordinator - Managed Medicaid High Risk ?[phone] ?  ? ? ?

## 2021-07-05 ENCOUNTER — Telehealth: Payer: Self-pay | Admitting: Pulmonary Disease

## 2021-07-05 MED ORDER — ALBUTEROL SULFATE HFA 108 (90 BASE) MCG/ACT IN AERS: 2.0000 | INHALATION_SPRAY | RESPIRATORY_TRACT | 3 refills | Status: DC | PRN

## 2021-07-05 MED ORDER — UMECLIDINIUM-VILANTEROL 62.5-25 MCG/ACT IN AEPB: 1.0000 | INHALATION_SPRAY | Freq: Every day | RESPIRATORY_TRACT | 2 refills | Status: DC

## 2021-07-05 NOTE — Telephone Encounter (Signed)
Called patient and notified her that her medications were refilled today and sent to the pharmacy she requested. No questions noted. Nothing further needed  ?

## 2021-07-07 ENCOUNTER — Other Ambulatory Visit: Payer: Self-pay

## 2021-07-10 MED ORDER — ACETAZOLAMIDE 250 MG PO TABS: 250.0000 mg | ORAL_TABLET | Freq: Every day | ORAL | 0 refills | Status: DC

## 2021-07-11 ENCOUNTER — Ambulatory Visit
Admission: RE | Admit: 2021-07-11 | Discharge: 2021-07-11 | Disposition: A | Discharge status: Home / Self Care | Payer: Medicaid Other | Source: Ambulatory Visit | Attending: Family Medicine | Admitting: Family Medicine

## 2021-07-11 ENCOUNTER — Other Ambulatory Visit: Payer: Self-pay | Admitting: Family Medicine

## 2021-07-11 DIAGNOSIS — R921 Mammographic calcification found on diagnostic imaging of breast: Secondary | ICD-10-CM | POA: Diagnosis not present

## 2021-07-17 ENCOUNTER — Other Ambulatory Visit: Payer: Self-pay | Admitting: Obstetrics and Gynecology

## 2021-07-17 NOTE — Patient Instructions (Signed)
Visit Information ? ?Ms. Cater was given information about Medicaid Managed Care team care coordination services as a part of their [place] Medicaid benefit. [name] verbally consented to engagement with the [facility] Managed Care team.  ? ?If you are experiencing a medical emergency, please call 911 or report to your local emergency department or urgent care.  ? ?If you have a non-emergency medical problem during routine business hours, please contact your provider's office and ask to speak with a nurse.  ? ?For questions related to your [facility], please call: [phone] or visit the homepage here: [email or web address] ? ?If you would like to schedule transportation through your [facility], please call the following number at least 2 days in advance of your appointment: [phone]. ? Rides for urgent appointments can also be made after hours by calling Member Services. ? ?Call the [place] at [phone], at any time, 24 hours a day, 7 days a week. If you are in danger or need immediate medical attention call 911. ? ?If you would like help to quit smoking, call 1-800-QUIT-NOW 9523282359) OR Espa?ol: 1-855-D?jelo-Ya 940-232-1785) o para m?s informaci?n haga clic aqu? or Text READY to 200-400 to register via text ? ?Ms. Skousen - following are the goals we discussed in your visit today:  ? Goals Addressed   ? ?  ?  ?  ?  ? This Visit's Progress  ?  Protect My Health     ?  Timeframe:  Long-Range Goal ?Priority:  High ?Start Date:    01/31/21                         ?Expected End Date:     ongoing                 ? ?Follow Up Date 08/17/21 ?  ?- schedule appointment for flu shot ?- schedule appointment for vaccines needed due to my age or health ?- schedule recommended health tests (blood work, mammogram, colonoscopy, pap test) ?- schedule and keep  appointment for annual check-up  ?  ?Why is this important?   ?Screening tests can find diseases early when they are easier to treat.  ?Your doctor or nurse will talk with you about which tests are important for you.  ?Getting shots for common diseases like the flu and shingles will help prevent them.   ?  ?07/17/21:  no complaints today-PULM appt 5/8 ?  ? ?  ? ?Patient verbalizes understanding of instructions and care plan provided today and agrees to view in [place]. Active MyChart status confirmed with patient.   ? ?The Managed Medicaid care management team will reach out to the patient again over the next 30 days.  ? ?Aida Raider RN, BSN ?[facility] [place] [facility] ?Care Management Coordinator - Managed Medicaid High Risk ?[phone] ? ? ?Following is a copy of your plan of care:  ?Care Plan : [place] of Care  ?Updates made by Gayla Medicus, RN since 07/17/2021 12:00 AM  ?  ? ?Problem: Chronic Disease Management and Care Coordiantion Needs   ?Priority: High  ?Onset Date: 01/31/2021  ?  ? ?Long-Range Goal: Establish Plan of Care for Chronic Disease Management Needs   ?Start Date: 01/31/2021  ?Expected End Date: 10/17/2021  ?Priority: High  ?Note:   ?Current Barriers:  ?Knowledge Deficits related to plan of care for management of  chronic disease. Patient currently on 2L of Oxygen at night. ?Chronic Disease Management support and education needs related to tobacco use. ?07/17/21:  patient has decreased smoking to 1 pack or less a week-not using Nicorette gum.  PULM appt 07/24/21 ?RNCM Clinical Goal(s):  ?Patient will verbalize understanding of plan for management of HTN, HF, COPD, GERD, tobacco use, pulmonary hypertension, chronic hypoxic respiratory failure ?verbalize basic understanding of  HTN, HF, COPD, GERD, tobacco use, pulmonary hypertension, chronic hypoxic respiratory failure  disease process and self health management plan  ?take all medications exactly as prescribed and will call  provider for medication related questions ?demonstrate understanding of rationale for each prescribed medication  ?attend all scheduled medical appointments:  ?continue to work with RN Care Manager to address care management and care coordination needs related to  HTN, HF, COPD, GERD, tobacco use, pulmonary HTN, chronic hypoxic respiratory failure. ?work with pharmacist for medication review ?work with community resource care guide to address needs related to  dental resources through collaboration with Consulting civil engineer, provider, and care team.  ? ?Interventions: ?Inter-disciplinary care team collaboration (see longitudinal plan of care) ?Evaluation of current treatment plan related to  self management and patient's adherence to plan as established by provider ?Pharmacy referral for medication review ?Collaborated with Pharmacist for medication review ?Collaborated with Care Guide for dental resources ?Care Guide referral for dental resources-completed. ?Assessed SDOH barriers ? ?Patient Goals/Self-Care Activities: ?Patient will self administer medications as prescribed ?Patient will attend all scheduled provider appointments ?Patient will call pharmacy for medication refills ?Patient will continue to perform ADL's independently ?Patient will continue to perform IADL's independently ?Patient will call provider office for new concerns or questions ? ?Follow Up Plan:  The patient has been provided with contact information for the care management team and has been advised to call with any health related questions or concerns.  ?The care management team will reach out to the patient again over the next 30 days.   ?  ?

## 2021-07-17 NOTE — Patient Outreach (Signed)
?Medicaid Managed Care   ?Nurse Care Manager Note ? ?07/17/2021 ?Name:  Joy [name] MRN:  [id number] DOB:  Jan 07, 1961 ? ?Joy [name] is an 61 y.o. year old female who is a primary patient of Simmons-Robinson, Makiera, MD.  The [facility] - Inpatient Managed Care Coordination team was consulted for assistance with:    ?Chronic healthcare management needs, HTN, HF, COPD, tobacco use, GERD ? ?Joy [name] was given information about Medicaid Managed Care Coordination team services today. Joy [name] Patient agreed to services and verbal consent obtained. ? ?Engaged with patient by telephone for follow up visit in response to provider referral for case management and/or care coordination services.  ? ?Assessments/Interventions:  Review of past medical history, allergies, medications, health status, including review of consultants reports, laboratory and other test data, was performed as part of comprehensive evaluation and provision of chronic care management services. ? ?SDOH (Social Determinants of Health) assessments and interventions performed: ?SDOH Interventions   ? ?Flowsheet Row Most Recent Value  ?SDOH Interventions   ?Housing Interventions Intervention Not Indicated  ? ?  ?Care Plan ? ?No Known Allergies ? ?Medications Reviewed Today   ? ? Reviewed by Gayla Medicus, RN (Registered Nurse) on 07/17/21 at 1033  Med List Status: <None>  ? ?Medication Order Taking? Sig Documenting Provider Last Dose Status Informant  ?acetaZOLAMIDE (DIAMOX) 250 MG tablet [id number]  Take 1 tablet (250 mg total) by mouth daily. Simmons-Robinson, Riki Sheer, MD  Active   ?albuterol (VENTOLIN HFA) 108 (90 Base) MCG/ACT inhaler [id number]  Inhale 2 puffs into the lungs every 4 (four) hours as needed for wheezing or shortness of breath. Hunsucker, Bonna Gains, MD  Active   ?atorvastatin (LIPITOR) 20 MG tablet [id number]  Take 1 tablet (20 mg total) by mouth daily. Sharion Settler, DO  Active   ?Multiple Vitamin (MULTIVITAMIN WITH MINERALS) TABS tablet  [id number]  Take 1 tablet by mouth daily. [provider]  Active Self  ?nicotine polacrilex (NICORETTE) 2 MG gum [id number] No Take 1 each (2 mg total) by mouth as needed for smoking cessation.  ?Patient not taking: Reported on 07/17/2021  ? Sharion Settler, DO Not Taking Active   ?spironolactone (ALDACTONE) 25 MG tablet [id number]  Take 1 tablet (25 mg total) by mouth daily. Gladys Damme, MD  Active   ?torsemide (DEMADEX) 20 MG tablet [id number]  Take 1 tablet (20 mg total) by mouth daily. Gladys Damme, MD  Active   ?umeclidinium-vilanterol [facility] ELLIPTA) 62.5-25 MCG/ACT AEPB [id number]  Inhale 1 puff into the lungs daily. Hunsucker, Bonna Gains, MD  Active   ?umeclidinium-vilanterol (ANORO ELLIPTA) 62.5-25 MCG/INH AEPB [id number]  Inhale 1 puff into the lungs in the morning. Simmons-Robinson, Riki Sheer, MD  Active   ? ?  ?  ? ?  ? ?Patient Active Problem List  ? Diagnosis Date Noted  ? Side effect of medication 04/26/2021  ? Annual physical exam 07/18/2020  ? Right heart failure ([place]) 05/03/2019  ? COPD (chronic obstructive pulmonary disease) ([place]) 08/10/2015  ? Pulmonary hypertension ([place])   ? HFpEF 08/08/2015  ? Essential hypertension   ? Tobacco abuse 05/16/2006  ? GASTROESOPHAGEAL REFLUX, NO ESOPHAGITIS 05/16/2006  ? ?Conditions to be addressed/monitored per PCP order:  Chronic healthcare management needs, HTN, HF, COPD, tobacco use, GERD ? ?Care Plan : RN Care Manager Plan of Care  ?Updates made by Gayla Medicus, RN since 07/17/2021 12:00 AM  ?  ? ?Problem: Chronic Disease Management and Care Coordiantion Needs   ?Priority: High  ?Onset Date:  01/31/2021  ?  ? ?Long-Range Goal: Establish Plan of Care for Chronic Disease Management Needs   ?Start Date: 01/31/2021  ?Expected End Date: 10/17/2021  ?Priority: High  ?Note:   ?Current Barriers:  ?Knowledge Deficits related to plan of care for management of chronic disease. Patient currently on 2L of Oxygen at night. ?Chronic Disease Management support and  education needs related to tobacco use. ?07/17/21:  patient has decreased smoking to 1 pack or less a week-not using Nicorette gum.  PULM appt 07/24/21 ?RNCM Clinical Goal(s):  ?Patient will verbalize understanding of plan for management of HTN, HF, COPD, GERD, tobacco use, pulmonary hypertension, chronic hypoxic respiratory failure ?verbalize basic understanding of  HTN, HF, COPD, GERD, tobacco use, pulmonary hypertension, chronic hypoxic respiratory failure  disease process and self health management plan  ?take all medications exactly as prescribed and will call provider for medication related questions ?demonstrate understanding of rationale for each prescribed medication  ?attend all scheduled medical appointments:  ?continue to work with RN Care Manager to address care management and care coordination needs related to  HTN, HF, COPD, GERD, tobacco use, pulmonary HTN, chronic hypoxic respiratory failure. ?work with pharmacist for medication review ?work with community resource care guide to address needs related to  dental resources through collaboration with Consulting civil engineer, provider, and care team.  ? ?Interventions: ?Inter-disciplinary care team collaboration (see longitudinal plan of care) ?Evaluation of current treatment plan related to  self management and patient's adherence to plan as established by provider ?Pharmacy referral for medication review ?Collaborated with Pharmacist for medication review ?Collaborated with Care Guide for dental resources ?Care Guide referral for dental resources-completed. ?Assessed SDOH barriers ? ?Patient Goals/Self-Care Activities: ?Patient will self administer medications as prescribed ?Patient will attend all scheduled provider appointments ?Patient will call pharmacy for medication refills ?Patient will continue to perform ADL's independently ?Patient will continue to perform IADL's independently ?Patient will call provider office for new concerns or questions ? ?Follow Up  Plan:  The patient has been provided with contact information for the care management team and has been advised to call with any health related questions or concerns.  ?The care management team will reach out to the patient again over the next 30 days.   ? ?Follow Up:  Patient agrees to Care Plan and Follow-up. ? ?Plan: The Managed Medicaid care management team will reach out to the patient again over the next 30 days. and The  Patient has been provided with contact information for the Managed Medicaid care management team and has been advised to call with any health related questions or concerns. ? ?Date/time of next scheduled RN care management/care coordination outreach:  08/17/21 at 0900 ? ?

## 2021-07-20 ENCOUNTER — Ambulatory Visit
Admission: RE | Admit: 2021-07-20 | Discharge: 2021-07-20 | Disposition: A | Discharge status: Home / Self Care | Payer: Medicaid Other | Source: Ambulatory Visit | Attending: Family Medicine | Admitting: Family Medicine

## 2021-07-20 DIAGNOSIS — R921 Mammographic calcification found on diagnostic imaging of breast: Secondary | ICD-10-CM

## 2021-07-21 ENCOUNTER — Telehealth: Payer: Self-pay | Admitting: Family Medicine

## 2021-07-21 NOTE — Telephone Encounter (Signed)
Contacted patient to discuss results of breast biopsy and discuss follow up. No answer with no option to leave voicemail. Recommended patient return call to clinic. If patient returns call, please schedule an appt to discuss and further planning.  ? ?Eulis Foster, MD ?[place], PGY-3 ?[phone]  ? ? ?

## 2021-07-24 ENCOUNTER — Ambulatory Visit (INDEPENDENT_AMBULATORY_CARE_PROVIDER_SITE_OTHER): Payer: Medicaid Other | Admitting: Pulmonary Disease

## 2021-07-24 ENCOUNTER — Encounter: Payer: Self-pay | Admitting: Pulmonary Disease

## 2021-07-24 ENCOUNTER — Other Ambulatory Visit: Payer: Self-pay | Admitting: Family Medicine

## 2021-07-24 VITALS — BP 118/66 | HR 82 | Temp 98.0°F | Ht 66.0 in | Wt 162.0 lb

## 2021-07-24 DIAGNOSIS — J439 Emphysema, unspecified: Secondary | ICD-10-CM

## 2021-07-24 DIAGNOSIS — I272 Pulmonary hypertension, unspecified: Secondary | ICD-10-CM | POA: Diagnosis not present

## 2021-07-24 DIAGNOSIS — R921 Mammographic calcification found on diagnostic imaging of breast: Secondary | ICD-10-CM

## 2021-07-24 MED ORDER — UMECLIDINIUM-VILANTEROL 62.5-25 MCG/ACT IN AEPB: 1.0000 | INHALATION_SPRAY | Freq: Every day | RESPIRATORY_TRACT | 11 refills | Status: DC

## 2021-07-24 NOTE — Progress Notes (Signed)
? ?Synopsis: Referred in April 2021 for COPD by [name]*. ? ?Subjective:  ? ?PATIENT ID: Joy [name] GENDER: female DOB: 1960/11/12, MRN: [id number] ? ?Chief Complaint  ?Patient presents with  ? Follow-up  ?  Pt is here for follow up. She is currently on Anoro and Albuterol as needed. Pt states she feels like the Anoro is helping her symptoms with her emphysema.   ? ? ?Joy [name] is a 61 y.o. woman with a history of COPD, pulmonary hypertension, chronic hypoxic respiratory failure who presents for follow-up.  She continues on Anoro once daily. She discontinued smoking in February 2021 after previously smoking 0.5 packs/day. She uses nocturnal O2, 2L [place]. No desaturation when walked in office. Has declined enrollment in lung cancer screening. ? ? ?Doing well. No issues with DOE. Uses Anoro daily. Feels beneficial. Rare albuterol use. Discussed again role and rational for lung cancer screening. She declines this intervention. She had breast biopsy last week. Carcinoma in-situ. She is aware of it being cancer. Encouraged her to call PCP to arrange f/u - number provided on AVS. ? ? ?Past Medical History:  ?Diagnosis Date  ? Acute congestive heart failure ([place])   ? Acute dyspnea   ? Acute on chronic diastolic CHF (congestive heart failure) ([place]) 08/11/2015  ? Acute right heart failure ([place]) 08/11/2015  ? Bilateral lower extremity edema   ? CHF (congestive heart failure) ([place])   ? COPD (chronic obstructive pulmonary disease) ([place]) 08/10/2015  ? Dx on XRAY 08/08/15.  Needs PFTs   ? GASTROESOPHAGEAL REFLUX, NO ESOPHAGITIS 05/16/2006  ? Qualifier: Diagnosis of  By: Drucie Ip    ? GERD (gastroesophageal reflux disease)   ? Hypertension   ? Hypervolemia   ? Leg swelling hospitalized 08/08/2015  ? BLE  ? PELVIC MASS 07/05/2008  ? Qualifier: Diagnosis of  By: McDiarmid MD, Sherren Mocha    ? Pulmonary hypertension ([place])   ? Respiratory failure ([place]) 05/03/2019  ? Right heart failure ([place]) 08/10/2015  ? Tobacco abuse  05/16/2006  ? Qualifier: Diagnosis of  By: Drucie Ip    ?  ? ?Family History  ?Problem Relation Age of Onset  ? Aneurysm Mother   ? Hypertension Brother   ?  ? ?Past Surgical History:  ?Procedure Laterality Date  ? RIGHT HEART CATH N/A 09/16/2019  ? Procedure: RIGHT HEART CATH;  Surgeon: Jolaine Artist, MD;  Location: [facility];  Service: Cardiovascular;  Laterality: N/A;  ? RIGHT/LEFT HEART CATH AND CORONARY ANGIOGRAPHY N/A 12/26/2018  ? Procedure: RIGHT/LEFT HEART CATH AND CORONARY ANGIOGRAPHY;  Surgeon: Jolaine Artist, MD;  Location: [facility];  Service: Cardiovascular;  Laterality: N/A;  ? TUBAL LIGATION    ? ? ?Social History  ? ?Socioeconomic History  ? Marital status: Married  ?  Spouse name: Not on file  ? Number of children: Not on file  ? Years of education: Not on file  ? Highest education level: Not on file  ?Occupational History  ? Not on file  ?Tobacco Use  ? Smoking status: Former  ?  Packs/day: 1.00  ?  Years: 36.00  ?  Pack years: 36.00  ?  Types: Cigarettes  ?  Quit date: 04/20/2019  ?  Years since quitting: 2.2  ? Smokeless tobacco: Former  ?  Types: Chew  ? Tobacco comments:  ?  "quit chewing in my early '20s"  ?Vaping Use  ? Vaping Use: Never used  ?Substance and Sexual Activity  ?  Alcohol use: Yes  ?  Alcohol/week: 23.0 standard drinks  ?  Types: 12 Cans of beer, 11 Shots of liquor per week  ? Drug use: Yes  ?  Types: Marijuana  ?  Comment: 08/08/2015 "I smoke marijuana when I have it; probably a couple times/week"  ? Sexual activity: Yes  ?Other Topics Concern  ? Not on file  ?Social History Narrative  ? Not on file  ? ?Social Determinants of Health  ? ?Financial Resource Strain: Not on file  ?Food Insecurity: No Food Insecurity  ? Worried About Charity fundraiser in the Last Year: Never true  ? Ran Out of Food in the Last Year: Never true  ?Transportation Needs: Unmet Transportation Needs  ? Lack of Transportation (Medical): Yes  ? Lack of Transportation  (Non-Medical): Yes  ?Physical Activity: Not on file  ?Stress: Not on file  ?Social Connections: Moderately Isolated  ? Frequency of Communication with Friends and Family: More than three times a week  ? Frequency of Social Gatherings with Friends and Family: More than three times a week  ? Attends Religious Services: Never  ? Active Member of Clubs or Organizations: No  ? Attends Archivist Meetings: Never  ? Marital Status: Married  ?Intimate Partner Violence: Not At Risk  ? Fear of Current or Ex-Partner: No  ? Emotionally Abused: No  ? Physically Abused: No  ? Sexually Abused: No  ?  ? ?No Known Allergies  ? ?Immunization History  ?Administered Date(s) Administered  ? Influenza,inj,Quad PF,6+ Mos 03/06/2018, 12/27/2018, 04/26/2021  ? PFIZER(Purple Top)SARS-COV-2 Vaccination 06/04/2019, 06/25/2019  ? PPD Test 11/04/2015  ? Pension scheme manager 23yr & up 04/26/2021  ? Pneumococcal Conjugate-13 09/29/2019  ? Pneumococcal Polysaccharide-23 08/09/2015, 03/06/2018  ? Td 01/17/2005  ? Tdap 04/26/2021  ? ? ?Outpatient Medications Prior to Visit  ?Medication Sig Dispense Refill  ? acetaZOLAMIDE (DIAMOX) 250 MG tablet Take 1 tablet (250 mg total) by mouth daily. 30 tablet 0  ? albuterol (VENTOLIN HFA) 108 (90 Base) MCG/ACT inhaler Inhale 2 puffs into the lungs every 4 (four) hours as needed for wheezing or shortness of breath. 18 g 3  ? atorvastatin (LIPITOR) 20 MG tablet Take 1 tablet (20 mg total) by mouth daily. 90 tablet 3  ? Multiple Vitamin (MULTIVITAMIN WITH MINERALS) TABS tablet Take 1 tablet by mouth daily.    ? nicotine polacrilex (NICORETTE) 2 MG gum Take 1 each (2 mg total) by mouth as needed for smoking cessation. 100 tablet 0  ? spironolactone (ALDACTONE) 25 MG tablet Take 1 tablet (25 mg total) by mouth daily. 90 tablet 1  ? torsemide (DEMADEX) 20 MG tablet Take 1 tablet (20 mg total) by mouth daily. 90 tablet 1  ? umeclidinium-vilanterol (ANORO ELLIPTA) 62.5-25 MCG/ACT AEPB  Inhale 1 puff into the lungs daily. 60 each 2  ? umeclidinium-vilanterol (ANORO ELLIPTA) 62.5-25 MCG/INH AEPB Inhale 1 puff into the lungs in the morning. 60 each 5  ? ?No facility-administered medications prior to visit.  ? ? ? ? ?Objective:  ? ?Vitals:  ? 07/24/21 0853  ?BP: 118/66  ?Pulse: 82  ?Temp: 98 ?F (36.7 ?C)  ?TempSrc: Oral  ?SpO2: 96%  ?Weight: 162 lb (73.5 kg)  ?Height: _0  (1.676 m)  ? ?96% on   RA ?BMI Readings from Last 3 Encounters:  ?07/24/21 26.15 kg/m?  ?04/26/21 27.18 kg/m?  ?07/19/20 25.66 kg/m?  ? ?Wt Readings from Last 3 Encounters:  ?07/24/21 162 lb (73.5 kg)  ?  04/26/21 168 lb 6.4 oz (76.4 kg)  ?07/19/20 161 lb 6.4 oz (73.2 kg)  ? ? ?Physical Exam ?Vitals reviewed.  ?Constitutional:   ?   General: She is not in acute distress. ?   Appearance: She is not ill-appearing.  ?HENT:  ?   Head: Normocephalic and atraumatic.  ?Eyes:  ?   General: No scleral icterus. ?Cardiovascular:  ?   Rate and Rhythm: Normal rate and regular rhythm.  ?   Heart sounds: No murmur heard. ?Pulmonary:  ?   Comments: CTAB, no conversational dyspnea ?Abdominal:  ?   General: There is no distension.  ?   Palpations: Abdomen is soft.  ?   Tenderness: There is no abdominal tenderness.  ?Musculoskeletal:     ?   General: No swelling or deformity.  ?   Cervical back: Neck supple.  ?Lymphadenopathy:  ?   Cervical: No cervical adenopathy.  ?Skin: ?   General: Skin is warm and dry.  ?   Findings: No rash.  ?Neurological:  ?   General: No focal deficit present.  ?   Mental Status: She is alert.  ?   Coordination: Coordination normal.  ?Psychiatric:     ?   Mood and Affect: Mood normal.     ?   Behavior: Behavior normal.  ? ? ? ?CBC ?   ?Component Value Date/Time  ? WBC 7.0 09/09/2019 1132  ? RBC 4.39 09/09/2019 1132  ? HGB 13.9 09/16/2019 1536  ? HCT 41.0 09/16/2019 1536  ? HCT 54.3 (H) 05/02/2019 0546  ? PLT 254 09/09/2019 1132  ? MCV 95.2 09/09/2019 1132  ? MCH 29.2 09/09/2019 1132  ? MCHC 30.6 09/09/2019 1132  ? RDW 13.8  09/09/2019 1132  ? LYMPHSABS 1.7 04/30/2019 1022  ? MONOABS 0.6 04/30/2019 1022  ? EOSABS 0.0 04/30/2019 1022  ? BASOSABS 0.1 04/30/2019 1022  ? ? ?CHEMISTRY ?No results for input(s): NA, K, CL, CO2, GLUCOSE, BUN, CREATINI

## 2021-07-24 NOTE — Telephone Encounter (Signed)
Patient returns call to nurse line.  ? ?Patient scheduled for 5/26 with PCP.  ?

## 2021-07-24 NOTE — Patient Instructions (Addendum)
Nice to see you again ? ?I refilled the Anoro for 1 year ? ?Consider calling Dr. Alba Cory - [phone] ? ?Return to clinic in 1 year or sooner as needed with Dr. Silas Flood ?

## 2021-07-28 ENCOUNTER — Telehealth: Payer: Self-pay | Admitting: Family Medicine

## 2021-07-28 DIAGNOSIS — D0510 Intraductal carcinoma in situ of unspecified breast: Secondary | ICD-10-CM

## 2021-07-28 NOTE — Telephone Encounter (Signed)
Called and verified speaking with Ms Bodkins. ? ?Discussed recent biopsy results showing DCIS without signs of invasive carcinoma, which patient states she is aware of. Discussed that the Breast center has been trying to get ahold of her to schedule further biopsies, she was not aware of this and so given number for the  ?[facility] RN coordinator. ?[phone]  ? ?Discussed I am also placing referral to general surgery per breast center recommendations as [facility] was not in her insurance coverage. Answered all other patient questions at this time. She states she will call the breast center back to schedule further biopsies. She has follow up with PCP in 2 weeks. ? ?Yehuda Savannah MD ?

## 2021-08-07 DIAGNOSIS — Z5982 Transportation insecurity: Secondary | ICD-10-CM | POA: Diagnosis not present

## 2021-08-07 DIAGNOSIS — Z748 Other problems related to care provider dependency: Secondary | ICD-10-CM | POA: Diagnosis not present

## 2021-08-07 DIAGNOSIS — Z9189 Other specified personal risk factors, not elsewhere classified: Secondary | ICD-10-CM | POA: Diagnosis not present

## 2021-08-07 DIAGNOSIS — D0512 Intraductal carcinoma in situ of left breast: Secondary | ICD-10-CM | POA: Diagnosis not present

## 2021-08-07 DIAGNOSIS — Z5941 Food insecurity: Secondary | ICD-10-CM | POA: Diagnosis not present

## 2021-08-07 NOTE — Progress Notes (Signed)
    SUBJECTIVE:   CHIEF COMPLAINT / HPI: f/u for breast biopsies   Patient had positive DCIS breast biopsy results. She has been scheduled with a Psychologist, sport and exercise. Patient reports she saw breast surgeon on 08/02/21 and is scheduled for follow up on 08/16/21. Patient had recent biopsies showing papillomas. Today she reports she has been doing well and has follow up appointments scheduled. She denies presence of pain. She has no further questions today.   HM and Tobacco Use  Patient recommended for colonoscopy, shingles vaccine and COVID vaccine. She reports smoking 1 pack for 1.5 week she has been smoking for close to 40+ years.  Patient states that she is interested in smoking cessation.  She would like to start medication.  She reports that she was previously prescribed nicotine gum but states that it was too expensive monthly and Medicaid will not cover this.   HLD Patient takes atorvastatin 56m daily  Last lipid panel was in 2022 with HDL 94, LDL 162 and TG 98 Patient is agreeable to lipid panel today.    Hypertension: - Medications: Spironolactone, torsemide - Compliance: Reports daily adherence - Checking BP at home: No - Denies any SOB, CP, vision changes, LE edema, medication SEs, or symptoms of hypotension   PERTINENT  PMH / PSH:  COPD  HTN  HFpEF Pulm HTN  GERD  Tobacco Use   OBJECTIVE:   BP 118/68   Pulse 67   Ht _0  (1.676 m)   Wt 162 lb (73.5 kg)   SpO2 93%   BMI 26.15 kg/m   Physical Exam Constitutional:      Appearance: She is normal weight.  HENT:     Right Ear: External ear normal.     Left Ear: External ear normal.     Nose: Nose normal.     Mouth/Throat:     Pharynx: Oropharynx is clear.  Cardiovascular:     Rate and Rhythm: Normal rate and regular rhythm.  Pulmonary:     Effort: Pulmonary effort is normal.  Abdominal:     General: Bowel sounds are normal.     Palpations: Abdomen is soft.  Musculoskeletal:     Cervical back: Normal range of motion.   Skin:    General: Skin is warm.  Neurological:     General: No focal deficit present.     Mental Status: She is alert and oriented to person, place, and time.     ASSESSMENT/PLAN:   Ductal carcinoma in situ (DCIS) of right breast Patient aware of results  Scheduled with breast surgeon, has already met with surgeon 08/02/21 Follow up scheduled for 08/16/21  Tobacco abuse Patient wishes to use medication assistant smoking cessation Counseled on potential side effects of Chantix including risk of suicidal thoughts and nightmares, patient wishes to proceed Prescribed starting doses of Chantix Patient to follow-up in 4 weeks   Hyperlipidemia Stable Lipid panel collected today Continue atorvastatin   Essential hypertension Stable and well-controlled Continue spironolactone 25 mg daily   Pulmonary hypertension ([place] Patient follows with Dr. HShanda Bumpsfor pulmonary care Continue Diamox 250 mg daily      MEulis Foster MD [place]

## 2021-08-08 ENCOUNTER — Other Ambulatory Visit: Payer: Self-pay | Admitting: Family Medicine

## 2021-08-09 ENCOUNTER — Ambulatory Visit
Admission: RE | Admit: 2021-08-09 | Discharge: 2021-08-09 | Disposition: A | Discharge status: Home / Self Care | Payer: Medicaid Other | Source: Ambulatory Visit | Attending: Family Medicine | Admitting: Family Medicine

## 2021-08-09 DIAGNOSIS — R921 Mammographic calcification found on diagnostic imaging of breast: Secondary | ICD-10-CM

## 2021-08-09 DIAGNOSIS — D0511 Intraductal carcinoma in situ of right breast: Secondary | ICD-10-CM | POA: Diagnosis not present

## 2021-08-09 DIAGNOSIS — Z170 Estrogen receptor positive status [ER+]: Secondary | ICD-10-CM | POA: Diagnosis not present

## 2021-08-09 DIAGNOSIS — C50212 Malignant neoplasm of upper-inner quadrant of left female breast: Secondary | ICD-10-CM | POA: Diagnosis not present

## 2021-08-09 DIAGNOSIS — C50311 Malignant neoplasm of lower-inner quadrant of right female breast: Secondary | ICD-10-CM | POA: Diagnosis not present

## 2021-08-09 DIAGNOSIS — N6489 Other specified disorders of breast: Secondary | ICD-10-CM | POA: Diagnosis not present

## 2021-08-11 ENCOUNTER — Encounter: Payer: Self-pay | Admitting: Family Medicine

## 2021-08-11 ENCOUNTER — Ambulatory Visit (INDEPENDENT_AMBULATORY_CARE_PROVIDER_SITE_OTHER): Payer: Medicaid Other | Admitting: Family Medicine

## 2021-08-11 VITALS — BP 118/68 | HR 67 | Ht 66.0 in | Wt 162.0 lb

## 2021-08-11 DIAGNOSIS — E785 Hyperlipidemia, unspecified: Secondary | ICD-10-CM | POA: Diagnosis not present

## 2021-08-11 DIAGNOSIS — I10 Essential (primary) hypertension: Secondary | ICD-10-CM | POA: Diagnosis not present

## 2021-08-11 DIAGNOSIS — D0511 Intraductal carcinoma in situ of right breast: Secondary | ICD-10-CM | POA: Diagnosis not present

## 2021-08-11 DIAGNOSIS — I272 Pulmonary hypertension, unspecified: Secondary | ICD-10-CM

## 2021-08-11 DIAGNOSIS — Z720 Tobacco use: Secondary | ICD-10-CM

## 2021-08-11 DIAGNOSIS — Z1211 Encounter for screening for malignant neoplasm of colon: Secondary | ICD-10-CM | POA: Diagnosis not present

## 2021-08-11 MED ORDER — VARENICLINE TARTRATE 0.5 MG X 11 & 1 MG X 42 PO TBPK
ORAL_TABLET | ORAL | 0 refills | Status: DC
Start: 2021-08-11 — End: 2021-09-18

## 2021-08-11 NOTE — Assessment & Plan Note (Signed)
Stable and well-controlled Continue spironolactone 25 mg daily

## 2021-08-11 NOTE — Assessment & Plan Note (Signed)
Stable Lipid panel collected today Continue atorvastatin

## 2021-08-11 NOTE — Assessment & Plan Note (Signed)
Patient wishes to use medication assistant smoking cessation Counseled on potential side effects of Chantix including risk of suicidal thoughts and nightmares, patient wishes to proceed Prescribed starting doses of Chantix Patient to follow-up in 4 weeks

## 2021-08-11 NOTE — Assessment & Plan Note (Signed)
Patient follows with Dr. Shanda Bumps for pulmonary care Continue Diamox 250 mg daily

## 2021-08-11 NOTE — Assessment & Plan Note (Addendum)
Patient aware of results  Scheduled with breast surgeon, has already met with surgeon 08/02/21 Follow up scheduled for 08/16/21

## 2021-08-11 NOTE — Patient Instructions (Signed)
Congratulations on starting your journey to completely stop smoking!  You will start this medication by taking 1/2 mg daily for 3 days and then take 1/2 mg twice daily for another 3 days.  After these initially 3 days she will take 1 whole tablet twice daily for 12 weeks.  You are due for colonoscopy, I have submitted a referral to have their office call to schedule an appointment.  We will also collect a lipid panel today to measure your cholesterol levels.

## 2021-08-12 LAB — LIPID PANEL
Chol/HDL Ratio: 2.8 ratio (ref 0.0–4.4)
Cholesterol, Total: 212 mg/dL — ABNORMAL HIGH (ref 100–199)
HDL: 77 mg/dL (ref >39)
LDL Chol Calc (NIH): 117 mg/dL — ABNORMAL HIGH (ref 0–99)
Triglycerides: 104 mg/dL (ref 0–149)
VLDL Cholesterol Cal: 18 mg/dL (ref 5–40)

## 2021-08-16 DIAGNOSIS — Z748 Other problems related to care provider dependency: Secondary | ICD-10-CM | POA: Diagnosis not present

## 2021-08-16 DIAGNOSIS — D0511 Intraductal carcinoma in situ of right breast: Secondary | ICD-10-CM | POA: Diagnosis not present

## 2021-08-16 DIAGNOSIS — Z5982 Transportation insecurity: Secondary | ICD-10-CM | POA: Diagnosis not present

## 2021-08-16 DIAGNOSIS — D0512 Intraductal carcinoma in situ of left breast: Secondary | ICD-10-CM | POA: Diagnosis not present

## 2021-08-16 DIAGNOSIS — Z5941 Food insecurity: Secondary | ICD-10-CM | POA: Diagnosis not present

## 2021-08-17 ENCOUNTER — Other Ambulatory Visit: Payer: Self-pay | Admitting: Obstetrics and Gynecology

## 2021-08-17 DIAGNOSIS — M810 Age-related osteoporosis without current pathological fracture: Secondary | ICD-10-CM | POA: Diagnosis not present

## 2021-08-17 NOTE — Patient Outreach (Signed)
Medicaid Managed Care   Nurse Care Manager Note  08/17/2021 Name:  Joy [name] MRN:  [id number] DOB:  07-19-1960  Joy [name] is an 61 y.o. year old female who is a primary patient of Simmons-Robinson, Riki Sheer, MD.  The [facility] Managed Care Coordination team was consulted for assistance with:    Chronic healthcare management needs, HTN, HF, COPD, tobacco use, DCIS, GERD  Joy [name] was given information about Medicaid Managed Care Coordination team services today. Joy [name] Patient agreed to services and verbal consent obtained.  Engaged with patient by telephone for follow up visit in response to provider referral for case management and/or care coordination services.   Assessments/Interventions:  Review of past medical history, allergies, medications, health status, including review of consultants reports, laboratory and other test data, was performed as part of comprehensive evaluation and provision of chronic care management services.  SDOH (Social Determinants of Health) assessments and interventions performed: SDOH Interventions    Flowsheet Row Most Recent Value  SDOH Interventions   Food Insecurity Interventions Intervention Not Indicated  Transportation Interventions Intervention Not Indicated  [patient states no difficulty getting to appointments]     Care Plan  No Known Allergies  Medications Reviewed Today     Reviewed by Joy Medicus, RN (Registered Nurse) on 08/17/21 at [place] List Status: <None>   Medication Order Taking? Sig Documenting Provider Last Dose Status Informant  acetaZOLAMIDE (DIAMOX) 250 MG tablet [id number] No TAKE 1 TABLET BY MOUTH EVERY DAY Simmons-Robinson, [place], MD Taking Active   albuterol (VENTOLIN HFA) 108 (90 Base) MCG/ACT inhaler [id number] No Inhale 2 puffs into the lungs every 4 (four) hours as needed for wheezing or shortness of breath. Hunsucker, Bonna Gains, MD Taking Active   atorvastatin (LIPITOR) 20 MG tablet [id number] No  Take 1 tablet (20 mg total) by mouth daily. Sharion Settler, DO Taking Active   Multiple Vitamin (MULTIVITAMIN WITH MINERALS) TABS tablet [id number] No Take 1 tablet by mouth daily. [provider] Taking Active Self  spironolactone (ALDACTONE) 25 MG tablet [id number] No Take 1 tablet (25 mg total) by mouth daily. Gladys Damme, MD Taking Active   torsemide [facility]) 20 MG tablet [id number] No Take 1 tablet (20 mg total) by mouth daily. Gladys Damme, MD Taking Active   umeclidinium-vilanterol [facility] ELLIPTA) 62.5-25 MCG/ACT AEPB [id number] No Inhale 1 puff into the lungs daily. Hunsucker, Bonna Gains, MD Taking Active   varenicline (CHANTIX PAK) 0.5 MG X 11 & 1 MG X 42 tablet [id number]  Take one 0.5 mg tablet by mouth once daily for 3 days, then increase to one 0.5 mg tablet twice daily for 4 days, then increase to one 1 mg tablet twice daily. Eulis Foster, MD  Active            Patient Active Problem List   Diagnosis Date Noted   Hyperlipidemia 08/11/2021   Screening for colon cancer 08/11/2021   Ductal carcinoma in situ (DCIS) of right breast 08/11/2021   Side effect of medication 04/26/2021   Annual physical exam 07/18/2020   Right heart failure ([place]) 05/03/2019   COPD (chronic obstructive pulmonary disease) ([place]) 08/10/2015   Pulmonary hypertension ([place])    HFpEF 08/08/2015   Essential hypertension    Tobacco abuse 05/16/2006   GASTROESOPHAGEAL REFLUX, NO ESOPHAGITIS 05/16/2006   Conditions to be addressed/monitored per PCP order:  Chronic healthcare management needs, HTN, HF, COPD, tobacco use, DCIS, GERD, HLD  Care Plan : RN Care Manager Plan of Care  Updates made by Joy Medicus, RN since 08/17/2021 12:00 AM     Problem: Chronic Disease Management and Care Coordiantion Needs   Priority: High  Onset Date: 01/31/2021     Long-Range Goal: Establish Plan of Care for Chronic Disease Management Needs   Start Date: 01/31/2021  Expected End Date:  10/17/2021  Priority: High  Note:   Current Barriers:  Knowledge Deficits related to plan of care for management of chronic disease. Patient currently on 2L of Oxygen at night. Chronic Disease Management support and education needs related to tobacco use. 08/17/21:  patient has decreased smoking to 2 cigarettes a day-has a new prescription from PCP for Chantix and will pick up today. Recently seen by PULM and PCP.  Patient with new DCIS diagnosis-awaiting surgery date from general surgeon.  Referral made for colopnoscopy.  COVID and Shingles vaccine recommended.  BP stable at PCP office 118/66-does not check at home. RNCM Clinical Goal(s):  Patient will verbalize understanding of plan for management of HTN, HF, COPD, GERD, tobacco use, pulmonary hypertension, chronic hypoxic respiratory failure verbalize basic understanding of  HTN, HF, COPD, GERD, tobacco use, pulmonary hypertension, chronic hypoxic respiratory failure  disease process and self health management plan  take all medications exactly as prescribed and will call provider for medication related questions demonstrate understanding of rationale for each prescribed medication  attend all scheduled medical appointments:  continue to work with RN Care Manager to address care management and care coordination needs related to  HTN, HF, COPD, GERD, tobacco use, pulmonary HTN, chronic hypoxic respiratory failure. work with pharmacist for medication review work with [employer] care guide to address needs related to  dental resources through collaboration with Consulting civil engineer, provider, and care team.   Interventions: Inter-disciplinary care team collaboration (see longitudinal plan of care) Evaluation of current treatment plan related to  self management and patient's adherence to plan as established by provider Pharmacy referral for medication review Collaborated with Pharmacist for medication review Collaborated with Care Guide for  dental resources Care Guide referral for dental resources-completed. Assessed SDOH barriers  Oncology:  (Status: New goal.) Long Term Goal Assessment of understanding of oncology diagnosis:  Assessed patient understanding of cancer diagnosis and recommended treatment plan, Reviewed upcoming provider appointments and treatment appointments, Assessed available transportation to appointments and treatments. Has consistent/reliable transportation: Yes, and Assessed support system. Has consistent/reliable family or other support: Yes  Patient Goals/Self-Care Activities: Patient will self administer medications as prescribed Patient will attend all scheduled provider appointments Patient will call pharmacy for medication refills Patient will continue to perform ADL's independently Patient will continue to perform IADL's independently Patient will call provider office for new concerns or questions  Follow Up Plan:  The patient has been provided with contact information for the care management team and has been advised to call with any health related questions or concerns.  The care management team will reach out to the patient again over the next 30 days.    Follow Up:  Patient agrees to Care Plan and Follow-up.  Plan: The Managed Medicaid care management team will reach out to the patient again over the next 30 days. and The  Patient has been provided with contact information for the Managed Medicaid care management team and has been advised to call with any health related questions or concerns.  Date/time of next scheduled RN care management/care coordination outreach:  09/13/21 at 1030.

## 2021-08-17 NOTE — Patient Instructions (Signed)
Visit Information  Joy [name] was given information about Medicaid Managed Care team care coordination services as a part of their [place] Medicaid benefit. Joy [name] verbally consented to engagement with the [facility] Managed Care team.   If you are experiencing a medical emergency, please call 911 or report to your local emergency department or urgent care.   If you have a non-emergency medical problem during routine business hours, please contact your provider's office and ask to speak with a nurse.   For questions related to your [facility], please call: [phone] or visit the homepage here: [email or web address]  If you would like to schedule transportation through your [facility], please call the following number at least 2 days in advance of your appointment: [phone].  Rides for urgent appointments can also be made after hours by calling Member Services.  Call the [place] at [phone], at any time, 24 hours a day, 7 days a week. If you are in danger or need immediate medical attention call 911.  If you would like help to quit smoking, call 1-800-QUIT-NOW 534-451-4663) OR Espaol: 1-855-Djelo-Ya (0-539-767-3419) o para ms informacin haga clic aqu or Text READY to 200-400 to register via text  Joy [name] - following are the goals we discussed in your visit today:   Goals Addressed             This Visit's Progress    Protect My Health       Timeframe:  Long-Range Goal Priority:  High Start Date:    01/31/21                         Expected End Date:     ongoing                  Follow Up Date 09/16/21   - schedule appointment for flu shot - schedule appointment for vaccines needed due to my age or health - schedule recommended health tests (blood work, mammogram, colonoscopy, pap test) - schedule and keep  appointment for annual check-up    Why is this important?   Screening tests can find diseases early when they are easier to treat.  Your doctor or nurse will talk with you about which tests are important for you.  Getting shots for common diseases like the flu and shingles will help prevent them.     08/17/21:  patient with recent visits to PULM and PCP-has new dx of DCIS and is being followed by general surgery-awaiting surgery date   The patient verbalized understanding of instructions, educational materials, and care plan provided today and DECLINED offer to receive copy of patient instructions, educational materials, and care plan.   The Managed Medicaid care management team will reach out to the patient again over the next 30 days.  The  Patient  has been provided with contact information for the Managed Medicaid care management team and has been advised to call with any health related questions or concerns.   Joy Raider RN, BSN [place] Management Coordinator - Managed Medicaid High Risk [phone]  Following is a copy of your plan of care:  Care Plan : [place] of Care  Updates made by Joy Medicus, RN since 08/17/2021 12:00 AM     Problem: Chronic Disease Management and Care Coordiantion Needs   Priority: High  Onset Date:  01/31/2021     Long-Range Goal: Establish Plan of Care for Chronic Disease Management Needs   Start Date: 01/31/2021  Expected End Date: 10/17/2021  Priority: High  Note:   Current Barriers:  Knowledge Deficits related to plan of care for management of chronic disease. Patient currently on 2L of Oxygen at night. Chronic Disease Management support and education needs related to tobacco use. 08/17/21:  patient has decreased smoking to 2 cigarettes a day-has a new prescription from PCP for Chantix and will pick up today. Recently seen by PULM and PCP.  Patient with new DCIS diagnosis-awaiting surgery date from  general surgeon.  Referral made for colopnoscopy.  COVID and Shingles vaccine recommended.  BP stable at PCP office 118/66-does not check at home. RNCM Clinical Goal(s):  Patient will verbalize understanding of plan for management of HTN, HF, COPD, GERD, tobacco use, pulmonary hypertension, chronic hypoxic respiratory failure verbalize basic understanding of  HTN, HF, COPD, GERD, tobacco use, pulmonary hypertension, chronic hypoxic respiratory failure  disease process and self health management plan  take all medications exactly as prescribed and will call provider for medication related questions demonstrate understanding of rationale for each prescribed medication  attend all scheduled medical appointments:  continue to work with RN Care Manager to address care management and care coordination needs related to  HTN, HF, COPD, GERD, tobacco use, pulmonary HTN, chronic hypoxic respiratory failure. work with pharmacist for medication review work with [employer] care guide to address needs related to  dental resources through collaboration with Consulting civil engineer, provider, and care team.   Interventions: Inter-disciplinary care team collaboration (see longitudinal plan of care) Evaluation of current treatment plan related to  self management and patient's adherence to plan as established by provider Pharmacy referral for medication review Collaborated with Pharmacist for medication review Collaborated with Care Guide for dental resources Care Guide referral for dental resources-completed. Assessed SDOH barriers  Oncology:  (Status: New goal.) Long Term Goal Assessment of understanding of oncology diagnosis:  Assessed patient understanding of cancer diagnosis and recommended treatment plan, Reviewed upcoming provider appointments and treatment appointments, Assessed available transportation to appointments and treatments. Has consistent/reliable transportation: Yes, and Assessed support  system. Has consistent/reliable family or other support: Yes  Patient Goals/Self-Care Activities: Patient will self administer medications as prescribed Patient will attend all scheduled provider appointments Patient will call pharmacy for medication refills Patient will continue to perform ADL's independently Patient will continue to perform IADL's independently Patient will call provider office for new concerns or questions  Follow Up Plan:  The patient has been provided with contact information for the care management team and has been advised to call with any health related questions or concerns.  The care management team will reach out to the patient again over the next 30 days.

## 2021-08-30 ENCOUNTER — Telehealth: Payer: Self-pay

## 2021-08-30 NOTE — Telephone Encounter (Signed)
Patient returns call to nurse line. Asked to disregard previous message, as she spoke with office again and they will be able to do surgery in [place].  No further action required.   Talbot Grumbling, RN

## 2021-08-30 NOTE — Telephone Encounter (Signed)
Patient calls nurse line requesting new referral for breast surgeon in [place]. Previous breast surgeon with [facility] retired and Chiropodist would be located in [place]. Patient states that she does not have transportation to [place].   Will forward to PCP for new referral.   Talbot Grumbling, RN

## 2021-08-30 NOTE — Telephone Encounter (Signed)
Acknowledge that patient will be able to have surgery in [place].

## 2021-09-05 DIAGNOSIS — I509 Heart failure, unspecified: Secondary | ICD-10-CM | POA: Diagnosis not present

## 2021-09-05 DIAGNOSIS — K219 Gastro-esophageal reflux disease without esophagitis: Secondary | ICD-10-CM | POA: Diagnosis not present

## 2021-09-05 DIAGNOSIS — Z170 Estrogen receptor positive status [ER+]: Secondary | ICD-10-CM | POA: Diagnosis not present

## 2021-09-05 DIAGNOSIS — I110 Hypertensive heart disease with heart failure: Secondary | ICD-10-CM | POA: Diagnosis not present

## 2021-09-05 DIAGNOSIS — D0512 Intraductal carcinoma in situ of left breast: Secondary | ICD-10-CM | POA: Diagnosis not present

## 2021-09-05 DIAGNOSIS — J449 Chronic obstructive pulmonary disease, unspecified: Secondary | ICD-10-CM | POA: Diagnosis not present

## 2021-09-05 DIAGNOSIS — D0511 Intraductal carcinoma in situ of right breast: Secondary | ICD-10-CM | POA: Diagnosis not present

## 2021-09-08 ENCOUNTER — Other Ambulatory Visit: Payer: Self-pay | Admitting: Family Medicine

## 2021-09-13 ENCOUNTER — Other Ambulatory Visit: Payer: Self-pay | Admitting: Obstetrics and Gynecology

## 2021-09-13 NOTE — Patient Instructions (Signed)
Visit Information  Ms. [name]  - as a part of your Medicaid benefit, you are eligible for care management and care coordination services at no cost or copay. I was unable to reach you by phone today but would be happy to help you with your health related needs. Please feel free to call me at [phone].  A member of the Managed Medicaid care management team will reach out to you again over the next 30 business days.   Aida Raider RN, BSN [place]  [employer]ator - Managed Medicaid High Risk [phone]

## 2021-09-13 NOTE — Patient Outreach (Signed)
Care Coordination  09/13/2021  SYRIA KESTNER 1960-12-02 [id number]   Medicaid Managed Care   Unsuccessful Outreach Note  09/13/2021 Name: Joy [name] MRN: [id number] DOB: 1960/08/11  Referred by: Eulis Foster, MD Reason for referral : High Risk Managed Medicaid (Unsuccessful telephone outreach)   An unsuccessful telephone outreach was attempted today. The patient was referred to the case management team for assistance with care management and care coordination.   Follow Up Plan: The care management team will reach out to the patient again over the next 30 business  days.   Aida Raider RN, BSN [place]  [employer] - Managed Medicaid High Risk [phone]

## 2021-09-18 ENCOUNTER — Other Ambulatory Visit: Payer: Self-pay | Admitting: Student

## 2021-09-18 ENCOUNTER — Other Ambulatory Visit: Payer: Self-pay | Admitting: Family Medicine

## 2021-09-18 MED ORDER — VARENICLINE TARTRATE 1 MG PO TABS: 1.0000 mg | ORAL_TABLET | Freq: Two times a day (BID) | ORAL | 0 refills | Status: DC

## 2021-09-18 NOTE — Progress Notes (Signed)
Patient continued on new Rx Chantix to be taken BID, did not refill "starting pack"

## 2021-09-20 DIAGNOSIS — D0511 Intraductal carcinoma in situ of right breast: Secondary | ICD-10-CM | POA: Diagnosis not present

## 2021-09-20 DIAGNOSIS — D0512 Intraductal carcinoma in situ of left breast: Secondary | ICD-10-CM | POA: Diagnosis not present

## 2021-09-21 ENCOUNTER — Telehealth: Payer: Self-pay | Admitting: Hematology and Oncology

## 2021-09-21 ENCOUNTER — Telehealth: Payer: Self-pay | Admitting: Radiation Oncology

## 2021-09-21 NOTE — Telephone Encounter (Signed)
Scheduled appt per 7/6 referral. Pt is aware of appt date and time. Pt is aware to arrive 15 mins prior to appt time and to bring and updated insurance card. Pt is aware of appt location.

## 2021-09-21 NOTE — Telephone Encounter (Signed)
Called patient to schedule a consultation w. Dr. Isidore Moos. No answer, LVM for a return call.

## 2021-10-02 NOTE — Progress Notes (Signed)
Location of Breast Cancer:  Bilateral ductal carcinoma in situ  Histology per Pathology Report:  09/20/2021 Final Diagnosis   Breast, right, inferior margin, excision: -Residual ductal carcinoma in situ, grade 2, present at a distance of 1.5 mm from the inked margin, see comment Comment   - Within slide 1B, tumor approaches the inked margin by 1.5 mm.  Additionally within slide 1J, there is a focus of in situ carcinoma that approaches a cauterized edge by approximately 0.2 mm, however, no definite ink is seen at the edge of this tissue  09/05/2021 Final Diagnosis   1.  Breast, left, lumpectomy: - Ductal carcinoma in situ, grade 3, solid and cribriform types with comedonecrosis and microcalcifications - Margins: Negative for tumor - Marked proliferative fibrocystic changes including florid usual ductal hyperplasia, sclerosing adenosis, apocrine metaplasia, columnar cell change, an intraductal papilloma, and a radial scar - See synoptic report Pathologic staging: pTis (DCIS) 2.  Breast, right, lumpectomy: - Ductal carcinoma in situ, predominantly grade 2, solid and cribriform types with associated microcalcifications - Margins: Separately submitted inferior margin is positive (part 4 below) - Marked proliferative fibrocystic changes including florid usual ductal hyperplasia, sclerosing adenosis, apocrine metaplasia, columnar cell change, and a radial scar  -See synoptic report 3.  Breast, right, medial margin, excision: - Focal ductal carcinoma in situ, grade 2, present at a distance of 1.5 mm to the new inked surgical margin 4.  Breast, right, inferior margin, excision: -Ductal carcinoma in situ, grade 2, present at the inked surgical margin (positive margin) Pathologic staging: pTis (DCIS)   Receptor Status:  09/05/2021 Left: ER(10%), PR (Negative) Right: ER(100%), PR (90%)  Did patient present with symptoms (if so, please note symptoms) or was this found on screening  mammography?: 12/13/2020: Bilateral screening mammography revealed heterogeneously dense breasts (category C). Right breast microcalcifications and left breast microcalcifications as well as 2 possible masses required further evaluation  Past/Anticipated interventions by surgeon, if any:  09/20/2021 --Dr. Tonita Cong [facility]) RIGHT BREAST LUMPECTOMY/SEGMENTECTOMY; RE-EXCISION FOR WIDER MARGINS   09/05/2021 --Dr. Tonita Cong [facility]) BILATERAL WIRE-LOCALIZED BREAST LUMPECTOMY/SEGMENTECTOMY  Past/Anticipated interventions by medical oncology, if any:  Scheduled for consultation wuth Dr. Serena Croissant on 10/12/2021  Lymphedema issues, if any:  Patient denies    Pain issues, if any:  Patient denies   SAFETY ISSUES: Prior radiation? No Pacemaker/ICD? No Possible current pregnancy? No--postmenopausal  Is the patient on methotrexate? No  Current Complaints / other details:

## 2021-10-02 NOTE — Progress Notes (Signed)
Radiation Oncology         ([phone]) [phone] ________________________________  Initial Outpatient Consultation  Name: Joy [name] MRN: [id number]  Date: 10/03/2021  DOB: 1960-06-04  MO:QHUTML, Joy Quan, MD  Alvino Chapel, MD   REFERRING PHYSICIAN: Alvino Chapel, MD  DIAGNOSIS: No diagnosis found.  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 female who initially presented with bilateral breast abnormalities on the following imaging: bilateral screening mammogram on the date of 12/13/20.  No symptoms, if any, were reported at that time. Diagnostic bilateral mammogram and left breast ultrasound on 01/04/21 showed: an indeterminate complex mass in the left breast at the 8 o'clock position measuring 0.8 cm, benign dilated ducts in the retroareolar left breast, probably benign grouped calcifications in the lower inner left breast spanning 0.8 cm, and probably benign scattered and occasionally grouped calcifications in the central and lower inner right breast. No abnormal lymph nodes in the left axilla were appreciated.   Biopsy of the 8 o'clock left breast on 01/10/21 showed no evidence of malignancy with columnar cell changes and ectatic ducts within sclerotic stroma.   Follow up bilateral diagnostic mammogram and left breast ultrasound on 07/11/21 showed increasing branching calcifications in the medial superior left breast at an anterior depth spanning 1 cm. The remainder of the bilateral calcifications were seen to remain stable, and the previously biopsied mass was no longer  appreciated. No other abnormalities or abnormal lymph nodes were appreciated.   Biopsy of the upper inner left breast on date of 07/20/21 showed low grade (grade 1) ductal carcinoma in-situ with comedonecrosis and microcalcifications measuring 5 mm in the greatest linear extent.  ER status: 10% positive with weak staining intensity; PR status negative, Her2 not assessed.  Biopsy of the lower inner right breast on 08/09/21 revealed intermediate grade ductal carcinoma in-situ with calcifications measuring 0.6 cm in the greatest linear extent. Prognostic indicators significant for: ER 100% positive and PR 90% positive, both with strong staining intensity; Her2 not assessed.  DXA for bone density on 08/17/21 showed findings consistent with osteoporosis.   The patient was referred to general surgery, and opted to proceed with bilateral lumpectomies without nodal biopsies on the date of 09/05/21 under the care of Dr. Charlane Ferretti.   - Pathology from left lumpectomy revealed: histology of grade 3 (high-grade) ductal carcinoma in-situ with comedonecrosis and microcalcifications. All margins negative for DCIS. No lymph nodes were examined. ER status: 10% positive with weak staining intensity; PR status negative, Her2 not assessed.  - Pathology from right breast lumpectomy revealed: histology of intermediate grade ductal carcinoma in-situ with associated microcalcifications; inferior margin positive for DCIS, and medial margin positive for focal DCIS at 1.5 mm from the surgical margin; no lymph nodes were examined. ER 100% positive and PR 90% positive, both with strong staining intensity; Her2 not assessed.  (Tumor/calcification size/extent not indicated on final pathology for either breast).   Accordingly, the patient underwent re-excision of the positive right inferior margin on 09/20/21. Final pathology showed residual intermediate grade DCIS at a distance of 1.5 mm from the inked margin.   The patient  followed up with Dr. Charlane Ferretti on 09/27/21 to discuss treatment options. Following discussion, the patient agreed to a referral to myself for consideration of radiation therapy to the bilateral breasts. Options including mastectomy and further margin re-excision were also presented to the patient.   The patient is scheduled to meet with Dr. Lindi Adie on 10/12/21 to discuss systemic treatment options.   ***  PREVIOUS RADIATION THERAPY: No  PAST MEDICAL HISTORY:  has a past medical history of Acute congestive heart failure ([place]), Acute dyspnea, Acute on chronic diastolic CHF (congestive heart failure) ([place]) (08/11/2015), Acute right heart failure ([place]) (08/11/2015), Bilateral lower extremity edema, CHF (congestive heart failure) ([place]), COPD (chronic obstructive pulmonary disease) ([place]) (08/10/2015), GASTROESOPHAGEAL REFLUX, NO ESOPHAGITIS (05/16/2006), GERD (gastroesophageal reflux disease), Hypertension, Hypervolemia, Leg swelling (hospitalized 08/08/2015), PELVIC MASS (07/05/2008), Pulmonary hypertension ([place]), Respiratory failure ([place]) (05/03/2019), Right heart failure ([place]) (08/10/2015), and Tobacco abuse (05/16/2006).    PAST SURGICAL HISTORY: Past Surgical History:  Procedure Laterality Date   RIGHT HEART CATH N/A 09/16/2019   Procedure: RIGHT HEART CATH;  Surgeon: Jolaine Artist, MD;  Location: [facility];  Service: Cardiovascular;  Laterality: N/A;   RIGHT/LEFT HEART CATH AND CORONARY ANGIOGRAPHY N/A 12/26/2018   Procedure: RIGHT/LEFT HEART CATH AND CORONARY ANGIOGRAPHY;  Surgeon: Jolaine Artist, MD;  Location: [facility];  Service: Cardiovascular;  Laterality: N/A;   TUBAL LIGATION      FAMILY HISTORY: family history includes Aneurysm in her mother; Hypertension in her brother.  SOCIAL HISTORY:  reports that she has been smoking cigarettes. She has a 36.00 pack-year smoking history. She has quit using smokeless tobacco.  Her smokeless tobacco use included chew. She reports  current alcohol use of about 23.0 standard drinks of alcohol per week. She reports current drug use. Drug: Marijuana.  ALLERGIES: Patient has no known allergies.  MEDICATIONS:  Current Outpatient Medications  Medication Sig Dispense Refill   acetaZOLAMIDE (DIAMOX) 250 MG tablet TAKE 1 TABLET BY MOUTH EVERY DAY 30 tablet 0   albuterol (VENTOLIN HFA) 108 (90 Base) MCG/ACT inhaler Inhale 2 puffs into the lungs every 4 (four) hours as needed for wheezing or shortness of breath. 18 g 3   atorvastatin (LIPITOR) 20 MG tablet Take 1 tablet (20 mg total) by mouth daily. 90 tablet 3   Multiple Vitamin (MULTIVITAMIN WITH MINERALS) TABS tablet Take 1 tablet by mouth daily.     spironolactone (ALDACTONE) 25 MG tablet Take 1 tablet (25 mg total) by mouth daily. 90 tablet 1   torsemide (DEMADEX) 20 MG tablet Take 1 tablet (20 mg total) by mouth daily. 90 tablet 1   umeclidinium-vilanterol (ANORO ELLIPTA) 62.5-25 MCG/ACT AEPB Inhale 1 puff into the lungs daily. 60 each 11   varenicline (CHANTIX) 1 MG tablet Take 1 tablet (1 mg total) by mouth 2 (two) times daily. 56 tablet 0   No current facility-administered medications for this encounter.    REVIEW OF SYSTEMS: As above in HPI.   PHYSICAL EXAM:  vitals were not taken for this visit.   General: Alert and oriented, in no acute distress HEENT: Head is normocephalic. Extraocular movements are intact. Oropharynx is clear. Neck: Neck is supple, no palpable cervical or supraclavicular lymphadenopathy. Heart: Regular in rate and rhythm with no murmurs, rubs, or gallops. Chest: Clear to auscultation bilaterally, with no rhonchi, wheezes, or rales. Abdomen: Soft, nontender, nondistended, with no rigidity or guarding. Extremities: No cyanosis or edema. Lymphatics: see Neck Exam Skin: No concerning lesions. Musculoskeletal: symmetric strength and  muscle tone throughout. Neurologic: Cranial nerves II through XII are grossly intact. No obvious focalities. Speech  is fluent. Coordination is intact. Psychiatric: Judgment and insight are intact. Affect is appropriate. Breasts: *** . No other palpable masses appreciated in the breasts or axillae *** .    ECOG = ***  0 - Asymptomatic (Fully active, able to carry on all predisease activities without restriction)  1 - Symptomatic but completely ambulatory (Restricted in physically strenuous activity but ambulatory and able to carry out work of a light or sedentary nature. For example, light housework, office work)  2 - Symptomatic, <50% in bed during the day (Ambulatory and capable of all self care but unable to carry out any work activities. Up and about more than 50% of waking hours)  3 - Symptomatic, >50% in bed, but not bedbound (Capable of only limited self-care, confined to bed or chair 50% or more of waking hours)  4 - Bedbound (Completely disabled. Cannot carry on any self-care. Totally confined to bed or chair)  5 - Death   Eustace Pen MM, Creech RH, Tormey DC, et al. [phone]). "Toxicity and response criteria of the [facility]". [place] Oncol. 5 (6): 649-55   LABORATORY DATA:  Lab Results  Component Value Date   WBC 7.0 09/09/2019   HGB 13.9 09/16/2019   HCT 41.0 09/16/2019   MCV 95.2 09/09/2019   PLT 254 09/09/2019   CMP     Component Value Date/Time   NA 139 04/26/2021 1111   K 4.3 04/26/2021 1111   CL 92 (L) 04/26/2021 1111   CO2 30 (H) 04/26/2021 1111   GLUCOSE 90 04/26/2021 1111   GLUCOSE 96 09/09/2019 1132   BUN 13 04/26/2021 1111   CREATININE 0.86 04/26/2021 1111   CREATININE 0.75 09/22/2015 1054   CALCIUM 10.1 04/26/2021 1111   PROT 5.6 (L) 05/01/2019 0429   ALBUMIN 3.1 (L) 05/01/2019 0429   AST 24 05/01/2019 0429   ALT 17 05/01/2019 0429   ALKPHOS 54 05/01/2019 0429   BILITOT 1.2 05/01/2019 0429   GFRNONAA >60 09/09/2019 1132   GFRNONAA 87 08/23/2015 1158   GFRAA >60 09/09/2019 1132   GFRAA >89 08/23/2015 1158         RADIOGRAPHY: No  results found.    IMPRESSION/PLAN: ***   It was a pleasure meeting the patient today. We discussed the risks, benefits, and side effects of radiotherapy. I recommend radiotherapy to the *** to reduce her risk of locoregional recurrence by 2/3.  We discussed that radiation would take approximately *** weeks to complete and that I would give the patient a few weeks to heal following surgery before starting treatment planning. *** If chemotherapy were to be given, this would precede radiotherapy. We spoke about acute effects including skin irritation and fatigue as well as much less common late effects including internal organ injury or irritation. We spoke about the latest technology that is used to minimize the risk of late effects for patients undergoing radiotherapy to the breast or chest wall. No guarantees of treatment were given. The patient is enthusiastic about proceeding with treatment. I look forward to participating in the patient's care.  I will await her referral back to me for postoperative follow-up and eventual CT simulation/treatment planning.  On date of service, in total, I spent *** minutes on this encounter. Patient was seen in person.   __________________________________________   Eppie Gibson, MD  This document serves as a record of services personally performed  by Eppie Gibson, MD. It was created on her behalf by Roney Mans, a trained medical scribe. The creation of this record is based on the scribe's personal observations and the provider's statements to them. This document has been checked and approved by the attending provider.

## 2021-10-03 ENCOUNTER — Ambulatory Visit
Admission: RE | Admit: 2021-10-03 | Discharge: 2021-10-03 | Disposition: A | Discharge status: Home / Self Care | Payer: Medicaid Other | Source: Ambulatory Visit | Attending: Radiation Oncology | Admitting: Radiation Oncology

## 2021-10-03 ENCOUNTER — Other Ambulatory Visit: Payer: Self-pay

## 2021-10-03 ENCOUNTER — Encounter: Payer: Self-pay | Admitting: Radiation Oncology

## 2021-10-03 VITALS — BP 117/70 | HR 77 | Temp 97.2°F | Resp 20 | Wt 161.2 lb

## 2021-10-03 DIAGNOSIS — J449 Chronic obstructive pulmonary disease, unspecified: Secondary | ICD-10-CM | POA: Insufficient documentation

## 2021-10-03 DIAGNOSIS — D0511 Intraductal carcinoma in situ of right breast: Secondary | ICD-10-CM | POA: Diagnosis not present

## 2021-10-03 DIAGNOSIS — Z923 Personal history of irradiation: Secondary | ICD-10-CM | POA: Diagnosis not present

## 2021-10-03 DIAGNOSIS — K219 Gastro-esophageal reflux disease without esophagitis: Secondary | ICD-10-CM | POA: Insufficient documentation

## 2021-10-03 DIAGNOSIS — D0512 Intraductal carcinoma in situ of left breast: Secondary | ICD-10-CM | POA: Insufficient documentation

## 2021-10-03 DIAGNOSIS — I5082 Biventricular heart failure: Secondary | ICD-10-CM | POA: Insufficient documentation

## 2021-10-03 DIAGNOSIS — F17210 Nicotine dependence, cigarettes, uncomplicated: Secondary | ICD-10-CM | POA: Insufficient documentation

## 2021-10-03 DIAGNOSIS — Z170 Estrogen receptor positive status [ER+]: Secondary | ICD-10-CM | POA: Diagnosis not present

## 2021-10-03 DIAGNOSIS — I5032 Chronic diastolic (congestive) heart failure: Secondary | ICD-10-CM | POA: Diagnosis not present

## 2021-10-03 DIAGNOSIS — Z8042 Family history of malignant neoplasm of prostate: Secondary | ICD-10-CM | POA: Diagnosis not present

## 2021-10-03 DIAGNOSIS — Z79899 Other long term (current) drug therapy: Secondary | ICD-10-CM | POA: Diagnosis not present

## 2021-10-11 ENCOUNTER — Other Ambulatory Visit: Payer: Medicaid Other

## 2021-10-12 ENCOUNTER — Other Ambulatory Visit: Payer: Self-pay

## 2021-10-12 ENCOUNTER — Inpatient Hospital Stay: Payer: Medicaid Other | Attending: Hematology and Oncology | Admitting: Hematology and Oncology

## 2021-10-12 DIAGNOSIS — D0511 Intraductal carcinoma in situ of right breast: Secondary | ICD-10-CM | POA: Diagnosis not present

## 2021-10-12 DIAGNOSIS — F17210 Nicotine dependence, cigarettes, uncomplicated: Secondary | ICD-10-CM | POA: Insufficient documentation

## 2021-10-12 NOTE — Progress Notes (Signed)
[facility] CONSULT NOTE  Patient Care Team: Holley Bouche, MD as PCP - General (Family Medicine) Craft, Lorel Monaco, RN as Case Manager  CHIEF COMPLAINTS/PURPOSE OF CONSULTATION:  Newly diagnosed bilateral DCIS  HISTORY OF PRESENTING ILLNESS:  Joy [name] 61 y.o. female is here because of recent diagnosis of bilateral DCIS.  Patient had routine screening mammogram detected bilateral breast abnormalities.  She underwent bilateral breast biopsies.  Initial biopsy showed intraductal papilloma but the right side showed DCIS.  She went to [facility] and underwent bilateral lumpectomies.  Final margins were positive so she had to go back in for another surgery and the final margins are clear.  DCIS was ER/PR positive.  It was intermediate grade.  I reviewed her records extensively and collaborated the history with the patient.  SUMMARY OF ONCOLOGIC HISTORY: Oncology History  Ductal carcinoma in situ (DCIS) of right breast  08/09/2021 Initial Diagnosis   Screening mammogram detected bilateral breast abnormalities Left breast biopsy: Intraductal papilloma with UDH and calcifications Right breast: Intraductal papilloma with UDH and calcifications and DCIS intermediate grade ER 100%, PR 90%    Surgery   Bilateral lumpectomies: Bilateral DCIS, right breast inferior margin positive, repeat surgery for margins: Additional DCIS was found in the additional margin.  Final margins are clear      MEDICAL HISTORY:  Past Medical History:  Diagnosis Date   Acute congestive heart failure (HCC)    Acute dyspnea    Acute on chronic diastolic CHF (congestive heart failure) ([place]) 08/11/2015   Acute right heart failure ([place]) 08/11/2015   Bilateral lower extremity edema    CHF (congestive heart failure) (HCC)    COPD (chronic obstructive pulmonary disease) ([place]) 08/10/2015   Dx on XRAY 08/08/15.  Needs PFTs    GASTROESOPHAGEAL REFLUX, NO ESOPHAGITIS 05/16/2006   Qualifier: Diagnosis of   By: Drucie Ip     GERD (gastroesophageal reflux disease)    Hypertension    Hypervolemia    Leg swelling hospitalized 08/08/2015   BLE   PELVIC MASS 07/05/2008   Qualifier: Diagnosis of  By: McDiarmid MD, Todd     Pulmonary hypertension ([place])    Respiratory failure ([place]) 05/03/2019   Right heart failure ([place]) 08/10/2015   Tobacco abuse 05/16/2006   Qualifier: Diagnosis of  By: Drucie Ip      SURGICAL HISTORY: Past Surgical History:  Procedure Laterality Date   RIGHT HEART CATH N/A 09/16/2019   Procedure: RIGHT HEART CATH;  Surgeon: Jolaine Artist, MD;  [facility];  Service: Cardiovascular;  Laterality: N/A;   RIGHT/LEFT HEART CATH AND CORONARY ANGIOGRAPHY N/A 12/26/2018   Procedure: RIGHT/LEFT HEART CATH AND CORONARY ANGIOGRAPHY;  Surgeon: Jolaine Artist, MD;  Location: [facility];  Service: Cardiovascular;  Laterality: N/A;   TUBAL LIGATION      SOCIAL HISTORY: Social History   Socioeconomic History   Marital status: Married    Spouse name: Not on file   Number of children: Not on file   Years of education: Not on file   Highest education level: Not on file  Occupational History   Not on file  Tobacco Use   Smoking status: Every Day    Packs/day: 0.50    Years: 36.00    Total pack years: 18.00    Types: Cigarettes    Last attempt to quit: 04/20/2019    Years since quitting: 2.4   Smokeless tobacco: Former    Types: Loss adjuster, chartered  Tobacco comments:    "quit chewing in my early '20s"  Vaping Use   Vaping Use: Never used  Substance and Sexual Activity   Alcohol use: Yes    Alcohol/week: 23.0 standard drinks of alcohol    Types: 12 Cans of beer, 11 Shots of liquor per week    Comment: occasional   Drug use: Yes    Types: Marijuana    Comment: 08/08/2015 "I smoke marijuana when I have it; probably a couple times/week"   Sexual activity: Yes  Other Topics Concern   Not on file  Social History Narrative   Not on file   Social  Determinants of Health   Financial Resource Strain: High Risk (05/12/2019)   Overall Financial Resource Strain (CARDIA)    Difficulty of Paying Living Expenses: Hard  Food Insecurity: No Food Insecurity (08/17/2021)   Hunger Vital Sign    Worried About Running Out of Food in the Last Year: Never true    Ran Out of Food in the Last Year: Never true  Transportation Needs: No Transportation Needs (08/17/2021)   PRAPARE - Hydrologist (Medical): No    Lack of Transportation (Non-Medical): No  Physical Activity: Inactive (05/12/2019)   Exercise Vital Sign    Days of Exercise per Week: 0 days    Minutes of Exercise per Session: 0 min  Stress: No Stress Concern Present (05/12/2019)   [place]    Feeling of Stress : Not at all  Social Connections: Moderately Isolated (07/17/2021)   Social Connection and Isolation Panel [NHANES]    Frequency of Communication with Friends and Family: More than three times a week    Frequency of Social Gatherings with Friends and Family: More than three times a week    Attends Religious Services: Never    Marine scientist or Organizations: No    Attends Archivist Meetings: Never    Marital Status: Married  Human resources officer Violence: Not At Risk (05/15/2021)   Humiliation, Afraid, Rape, and Kick questionnaire    Fear of Current or Ex-Partner: No    Emotionally Abused: No    Physically Abused: No    Sexually Abused: No    FAMILY HISTORY: Family History  Problem Relation Age of Onset   Aneurysm Mother    Hypertension Brother    Prostate cancer Brother     ALLERGIES:  has No Known Allergies.  MEDICATIONS:  Current Outpatient Medications  Medication Sig Dispense Refill   acetaZOLAMIDE (DIAMOX) 250 MG tablet TAKE 1 TABLET BY MOUTH EVERY DAY 30 tablet 0   albuterol (VENTOLIN HFA) 108 (90 Base) MCG/ACT inhaler Inhale 2 puffs into the lungs every 4  (four) hours as needed for wheezing or shortness of breath. 18 g 3   atorvastatin (LIPITOR) 20 MG tablet Take 1 tablet (20 mg total) by mouth daily. 90 tablet 3   HYDROcodone-acetaminophen (NORCO/VICODIN) 5-325 MG tablet Take 1 tablet by mouth every 6 (six) hours as needed.     Multiple Vitamin (MULTIVITAMIN WITH MINERALS) TABS tablet Take 1 tablet by mouth daily.     spironolactone (ALDACTONE) 25 MG tablet Take 1 tablet (25 mg total) by mouth daily. 90 tablet 1   torsemide (DEMADEX) 20 MG tablet Take 1 tablet (20 mg total) by mouth daily. 90 tablet 1   umeclidinium-vilanterol (ANORO ELLIPTA) 62.5-25 MCG/ACT AEPB Inhale 1 puff into the lungs daily. 60 each 11   varenicline (CHANTIX)  1 MG tablet Take 1 tablet (1 mg total) by mouth 2 (two) times daily. 56 tablet 0   No current facility-administered medications for this visit.    REVIEW OF SYSTEMS:   Constitutional: Denies fevers, chills or abnormal night sweats  All other systems were reviewed with the patient and are negative.  PHYSICAL EXAMINATION: ECOG PERFORMANCE STATUS: 1 - Symptomatic but completely ambulatory  Vitals:   10/12/21 1257  BP: 105/80  Pulse: 90  Resp: 18  Temp: (!) 97.2 F (36.2 C)  SpO2: 95%   Filed Weights   10/12/21 1257  Weight: 162 lb 3.2 oz (73.6 kg)    GENERAL:alert, no distress and comfortable    LABORATORY DATA:  I have reviewed the data as listed Lab Results  Component Value Date   WBC 7.0 09/09/2019   HGB 13.9 09/16/2019   HCT 41.0 09/16/2019   MCV 95.2 09/09/2019   PLT 254 09/09/2019   Lab Results  Component Value Date   NA 139 04/26/2021   K 4.3 04/26/2021   CL 92 (L) 04/26/2021   CO2 30 (H) 04/26/2021    RADIOGRAPHIC STUDIES: I have personally reviewed the radiological reports and agreed with the findings in the report.  ASSESSMENT AND PLAN:  Ductal carcinoma in situ (DCIS) of right breast 08/09/2021: Screening mammogram detected bilateral breast abnormalities.  She underwent  bilateral breast biopsies and subsequently went to [facility] where she underwent bilateral lumpectomies.  Right breast inferior margin was positive so she had to go back in for additional surgery and the final margins are negative.  DCIS was grade 2, ER 100%, PR 90%  Pathology counseling: I discussed the final pathology report of the patient provided  a copy of this report. I discussed the margins.  We also discussed the final staging along with previously performed ER/PR testing.   Recommendation: 1. adjuvant radiation therapy 2. Followed by antiestrogen therapy with tamoxifen 5 years  Tamoxifen counseling: We discussed the risks and benefits of tamoxifen. These include but not limited to insomnia, hot flashes, mood changes, vaginal dryness, and weight gain. Although rare, serious side effects including endometrial cancer, risk of blood clots were also discussed. We strongly believe that the benefits far outweigh the risks. Patient understands these risks and consented to starting treatment. Planned treatment duration is 5 years.  Return to clinic after radiation to discuss antiestrogens  All questions were answered. The patient knows to call the clinic with any problems, questions or concerns.    Harriette Ohara, MD 10/12/21

## 2021-10-12 NOTE — Assessment & Plan Note (Signed)
08/09/2021: Screening mammogram detected bilateral breast abnormalities.  She underwent bilateral breast biopsies and subsequently went to [facility] where she underwent bilateral lumpectomies.  Right breast inferior margin was positive so she had to go back in for additional surgery and the final margins are negative.  DCIS was grade 2, ER 100%, PR 90%  Pathology counseling: I discussed the final pathology report of the patient provided  a copy of this report. I discussed the margins.  We also discussed the final staging along with previously performed ER/PR testing.   Recommendation: 1. adjuvant radiation therapy 2. Followed by antiestrogen therapy with tamoxifen 5 years  Tamoxifen counseling: We discussed the risks and benefits of tamoxifen. These include but not limited to insomnia, hot flashes, mood changes, vaginal dryness, and weight gain. Although rare, serious side effects including endometrial cancer, risk of blood clots were also discussed. We strongly believe that the benefits far outweigh the risks. Patient understands these risks and consented to starting treatment. Planned treatment duration is 5 years.  Return to clinic after radiation to discuss antiestrogens

## 2021-10-13 ENCOUNTER — Other Ambulatory Visit: Payer: Self-pay | Admitting: Family Medicine

## 2021-10-17 ENCOUNTER — Other Ambulatory Visit: Payer: Self-pay

## 2021-10-17 ENCOUNTER — Ambulatory Visit
Admission: RE | Admit: 2021-10-17 | Discharge: 2021-10-17 | Disposition: A | Discharge status: Home / Self Care | Payer: Medicaid Other | Source: Ambulatory Visit | Attending: Radiation Oncology | Admitting: Radiation Oncology

## 2021-10-17 ENCOUNTER — Telehealth: Payer: Self-pay | Admitting: Licensed Clinical Social Worker

## 2021-10-17 DIAGNOSIS — D0511 Intraductal carcinoma in situ of right breast: Secondary | ICD-10-CM | POA: Insufficient documentation

## 2021-10-17 DIAGNOSIS — Z170 Estrogen receptor positive status [ER+]: Secondary | ICD-10-CM | POA: Diagnosis not present

## 2021-10-17 DIAGNOSIS — Z510 Encounter for antineoplastic radiation therapy: Secondary | ICD-10-CM | POA: Diagnosis not present

## 2021-10-17 DIAGNOSIS — D0512 Intraductal carcinoma in situ of left breast: Secondary | ICD-10-CM | POA: Diagnosis not present

## 2021-10-17 NOTE — Telephone Encounter (Signed)
Pt returned call to [place]. She engaged minimally in conversation but did state that she is doing well, has good support, and no resource needs at this time.  CSW encouraged pt to call if any coping or resource needs arise.  Chaitanya Amedee E Ayodele Hartsock, LCSW

## 2021-10-17 NOTE — Telephone Encounter (Signed)
Joy [name]  Clinical Social [name] was referred by  new pt protocol  for assessment of psychosocial needs.  Clinical Social Worker attempted to contact patient by phone  to offer support and assess for needs.  No answer. Left VM with direct contact information.      Joy [name] E Joy Calamari, LCSW  Clinical Social Worker [place]        Patient is participating in a Managed Medicaid Plan:  Yes

## 2021-10-18 ENCOUNTER — Other Ambulatory Visit: Payer: Self-pay | Admitting: Obstetrics and Gynecology

## 2021-10-18 NOTE — Patient Outreach (Signed)
Medicaid Managed Care   Nurse Care Manager Note  10/18/2021 Name:  Joy [name] MRN:  [id number] DOB:  [birth year]  Joy [name] is an 61 y.o. year old female who is a primary patient of Sowell, Erlene Quan, MD.  The [facility] Managed Care Coordination team was consulted for assistance with:    Chronic healthcare management needs, HTN, tobacco use, HF, COPD, DCIS, GERD  [name] was given information about Medicaid Managed Care Coordination team services today. Joy [name] Patient agreed to services and verbal consent obtained.  Engaged with patient by telephone for follow up visit in response to provider referral for case management and/or care coordination services.   Assessments/Interventions:  Review of past medical history, allergies, medications, health status, including review of consultants reports, laboratory and other test data, was performed as part of comprehensive evaluation and provision of chronic care management services.  SDOH (Social Determinants of Health) assessments and interventions performed: SDOH Interventions    Flowsheet Row Most Recent Value  SDOH Interventions   Financial Strain Interventions Intervention Not Indicated  Stress Interventions Intervention Not Indicated      Care Plan  No Known Allergies  Medications Reviewed Today     Reviewed by Gayla Medicus, RN (Registered Nurse) on 10/18/21 at 1340  Med List Status: <None>   Medication Order Taking? Sig Documenting Provider Last Dose Status Informant  acetaZOLAMIDE (DIAMOX) 250 MG tablet [id number]  TAKE 1 TABLET BY MOUTH EVERY DAY Sowell, Brandon, MD  Active   albuterol (VENTOLIN HFA) 108 (90 Base) MCG/ACT inhaler [id number] No Inhale 2 puffs into the lungs every 4 (four) hours as needed for wheezing or shortness of breath. Hunsucker, Bonna Gains, MD Taking Active   atorvastatin (LIPITOR) 20 MG tablet [id number] No Take 1 tablet (20 mg total) by mouth daily. Sharion Settler, DO Taking Active    HYDROcodone-acetaminophen (NORCO/VICODIN) 5-325 MG tablet [id number]  Take 1 tablet by mouth every 6 (six) hours as needed. [provider]  Active   Multiple Vitamin (MULTIVITAMIN WITH MINERALS) TABS tablet [id number] No Take 1 tablet by mouth daily. [provider] Taking Active Self  spironolactone (ALDACTONE) 25 MG tablet [id number] No Take 1 tablet (25 mg total) by mouth daily. Gladys Damme, MD Taking Active   torsemide [facility]) 20 MG tablet [id number] No Take 1 tablet (20 mg total) by mouth daily. Gladys Damme, MD Taking Active   umeclidinium-vilanterol [facility] ELLIPTA) 62.5-25 MCG/ACT AEPB [id number] No Inhale 1 puff into the lungs daily. Hunsucker, Bonna Gains, MD Taking Active   varenicline (CHANTIX) 1 MG tablet [id number]  Take 1 tablet (1 mg total) by mouth 2 (two) times daily. Holley Bouche, MD  Active            Patient Active Problem List   Diagnosis Date Noted   Hyperlipidemia 08/11/2021   Screening for colon cancer 08/11/2021   Ductal carcinoma in situ (DCIS) of right breast 08/11/2021   Side effect of medication 04/26/2021   Annual physical exam 07/18/2020   Right heart failure ([place]) 05/03/2019   COPD (chronic obstructive pulmonary disease) ([place]) 08/10/2015   Pulmonary hypertension ([place])    HFpEF 08/08/2015   Essential hypertension    Tobacco abuse 05/16/2006   GASTROESOPHAGEAL REFLUX, NO ESOPHAGITIS 05/16/2006   Conditions to be addressed/monitored per PCP order:  Chronic healthcare management needs, HTN, tobacco use, HF, COPD, DCIS, GERD, HLD  Care Plan : RN Care Manager Plan of Care  Updates made by Gayla Medicus, RN since 10/18/2021 12:00  AM     Problem: Chronic Disease Management and Care Coordiantion Needs   Priority: High  Onset Date: 01/31/2021     Long-Range Goal: Establish Plan of Care for Chronic Disease Management Needs   Start Date: 01/31/2021  Expected End Date: 01/18/2022  Priority: High  Note:   Current Barriers:   Knowledge Deficits related to plan of care for management of chronic disease. Patient currently on 2L of Oxygen at night. Chronic Disease Management support and education needs related to tobacco use. 10/18/21:  Patient with bilateral DCIS s/p lumpectomy.  To begin radiation treatments 10/26/21 5 days a week for ? 4 weeks.  Patient given [facility] transportation information again.  Patient continues to smoke 3 cigarettes a day-has Chantix RNCM Clinical Goal(s):  Patient will verbalize understanding of plan for management of HTN, HF, COPD, GERD, tobacco use, pulmonary hypertension, chronic hypoxic respiratory failure verbalize basic understanding of  HTN, HF, COPD, GERD, tobacco use, pulmonary hypertension, chronic hypoxic respiratory failure  disease process and self health management plan  take all medications exactly as prescribed and will call provider for medication related questions demonstrate understanding of rationale for each prescribed medication  attend all scheduled medical appointments:  continue to work with RN Care Manager to address care management and care coordination needs related to  HTN, HF, COPD, GERD, tobacco use, pulmonary HTN, chronic hypoxic respiratory failure. work with pharmacist for medication review work with [employer] care guide to address needs related to  dental resources through collaboration with Consulting civil engineer, provider, and care team.   Interventions: Inter-disciplinary care team collaboration (see longitudinal plan of care) Evaluation of current treatment plan related to  self management and patient's adherence to plan as established by provider Pharmacy referral for medication review Collaborated with Pharmacist for medication review Collaborated with Care Guide for dental resources Care Guide referral for dental resources-completed. Assessed SDOH barriers  Oncology:  (Status: New goal.) Long Term Goal Assessment of understanding of oncology  diagnosis:  Assessed patient understanding of cancer diagnosis and recommended treatment plan, Reviewed upcoming provider appointments and treatment appointments, Assessed available transportation to appointments and treatments. Has consistent/reliable transportation: Yes, and Assessed support system. Has consistent/reliable family or other support: Yes  Patient Goals/Self-Care Activities: Patient will self administer medications as prescribed Patient will attend all scheduled provider appointments Patient will call pharmacy for medication refills Patient will continue to perform ADL's independently Patient will continue to perform IADL's independently Patient will call provider office for new concerns or questions  Follow Up Plan:  The patient has been provided with contact information for the care management team and has been advised to call with any health related questions or concerns.  The care management team will reach out to the patient again over the next 30 business  days.    Follow Up:  Patient agrees to Care Plan and Follow-up.  Plan: The Managed Medicaid care management team will reach out to the patient again over the next 30 business  days. and The  Patient has been provided with contact information for the Managed Medicaid care management team and has been advised to call with any health related questions or concerns.  Date/time of next scheduled RN care management/care coordination outreach:  11/28/21 at 0900.

## 2021-10-18 NOTE — Patient Instructions (Signed)
Visit Information  Joy [name] was given information about Medicaid Managed Care team care coordination services as a part of their [place] Medicaid benefit. Joy [name] verbally consented to engagement with the [facility] Managed Care team.   If you are experiencing a medical emergency, please call 911 or report to your local emergency department or urgent care.   If you have a non-emergency medical problem during routine business hours, please contact your provider's office and ask to speak with a nurse.   For questions related to your [facility], please call: [phone] or visit the homepage here: [email or web address]  If you would like to schedule transportation through your [facility], please call the following number at least 2 days in advance of your appointment: [phone]   Rides for urgent appointments can also be made after hours by calling Member Services.  Call the [place] at [phone], at any time, 24 hours a day, 7 days a week. If you are in danger or need immediate medical attention call 911.  If you would like help to quit smoking, call 1-800-QUIT-NOW (505) 636-6747) OR Espaol: 1-855-Djelo-Ya (7-824-235-3614) o para ms informacin haga clic aqu or Text READY to 200-400 to register via text  Joy [name] - following are the goals we discussed in your visit today:   Goals Addressed             This Visit's Progress    Protect My Health       Timeframe:  Long-Range Goal Priority:  High Start Date:    01/31/21                         Expected End Date:     ongoing                  Follow Up Date 11/28/21   - schedule appointment for flu shot - schedule appointment for vaccines needed due to my age or health - schedule recommended health tests (blood work, mammogram, colonoscopy, pap test) - schedule and keep  appointment for annual check-up    Why is this important?   Screening tests can find diseases early when they are easier to treat.  Your doctor or nurse will talk with you about which tests are important for you.  Getting shots for common diseases like the flu and shingles will help prevent them.     10/18/21:  Patient to start radiation treatments next week   The patient verbalized understanding of instructions, educational materials, and care plan provided today and DECLINED offer to receive copy of patient instructions, educational materials, and care plan.   The Managed Medicaid care management team will reach out to the patient again over the next 30 business  days.  The  Patient has been provided with contact information for the Managed Medicaid care management team and has been advised to call with any health related questions or concerns.   Aida Raider RN, BSN [facility] Management Coordinator - Managed Medicaid High Risk [phone]   Following is a copy of your plan of care:  Care Plan : [place] of Care  Updates made by Gayla Medicus, RN since 10/18/2021 12:00 AM     Problem: Chronic Disease Management and Care Coordiantion Needs   Priority: High  Onset Date: 01/31/2021     Long-Range Goal: Establish Plan of Care  for Chronic Disease Management Needs   Start Date: 01/31/2021  Expected End Date: 01/18/2022  Priority: High  Note:   Current Barriers:  Knowledge Deficits related to plan of care for management of chronic disease. Patient currently on 2L of Oxygen at night. Chronic Disease Management support and education needs related to tobacco use. 10/18/21:  Patient with bilateral DCIS s/p lumpectomy.  To begin radiation treatments 10/26/21 5 days a week for ? 4 weeks.  Patient given [facility] transportation information again.  Patient continues to smoke 3 cigarettes a day-has Chantix RNCM Clinical Goal(s):  Patient will verbalize  understanding of plan for management of HTN, HF, COPD, GERD, tobacco use, pulmonary hypertension, chronic hypoxic respiratory failure verbalize basic understanding of  HTN, HF, COPD, GERD, tobacco use, pulmonary hypertension, chronic hypoxic respiratory failure  disease process and self health management plan  take all medications exactly as prescribed and will call provider for medication related questions demonstrate understanding of rationale for each prescribed medication  attend all scheduled medical appointments:  continue to work with RN Care Manager to address care management and care coordination needs related to  HTN, HF, COPD, GERD, tobacco use, pulmonary HTN, chronic hypoxic respiratory failure. work with pharmacist for medication review work with [employer] care guide to address needs related to  dental resources through collaboration with Consulting civil engineer, provider, and care team.   Interventions: Inter-disciplinary care team collaboration (see longitudinal plan of care) Evaluation of current treatment plan related to  self management and patient's adherence to plan as established by provider Pharmacy referral for medication review Collaborated with Pharmacist for medication review Collaborated with Care Guide for dental resources Care Guide referral for dental resources-completed. Assessed SDOH barriers  Oncology:  (Status: New goal.) Long Term Goal Assessment of understanding of oncology diagnosis:  Assessed patient understanding of cancer diagnosis and recommended treatment plan, Reviewed upcoming provider appointments and treatment appointments, Assessed available transportation to appointments and treatments. Has consistent/reliable transportation: Yes, and Assessed support system. Has consistent/reliable family or other support: Yes  Patient Goals/Self-Care Activities: Patient will self administer medications as prescribed Patient will attend all scheduled provider  appointments Patient will call pharmacy for medication refills Patient will continue to perform ADL's independently Patient will continue to perform IADL's independently Patient will call provider office for new concerns or questions  Follow Up Plan:  The patient has been provided with contact information for the care management team and has been advised to call with any health related questions or concerns.  The care management team will reach out to the patient again over the next 30 business  days.

## 2021-10-19 ENCOUNTER — Encounter: Payer: Self-pay | Admitting: Licensed Clinical Social Worker

## 2021-10-19 NOTE — Progress Notes (Signed)
[place] CSW Progress Note  Clinical Education officer, museum received TC from patient requesting help with transportation to appointments. CSW shared information on transportation benefit through patient's insurance [employer] [phone]) and provided instructions on how to contact them and set up rides.  Pt voiced understanding and no other needs at this time.    Joy [name] E Marcellus Pulliam, LCSW    Patient is participating in a Managed Medicaid Plan:  Yes

## 2021-10-20 ENCOUNTER — Telehealth: Payer: Self-pay | Admitting: Student

## 2021-10-20 DIAGNOSIS — D0511 Intraductal carcinoma in situ of right breast: Secondary | ICD-10-CM | POA: Diagnosis not present

## 2021-10-20 DIAGNOSIS — Z170 Estrogen receptor positive status [ER+]: Secondary | ICD-10-CM | POA: Diagnosis not present

## 2021-10-20 DIAGNOSIS — Z510 Encounter for antineoplastic radiation therapy: Secondary | ICD-10-CM | POA: Diagnosis not present

## 2021-10-20 DIAGNOSIS — D0512 Intraductal carcinoma in situ of left breast: Secondary | ICD-10-CM | POA: Diagnosis not present

## 2021-10-20 NOTE — Telephone Encounter (Signed)
Joy [name] DOB: Jul 21, 1960 MRN: [id number]   RIDER WAIVER AND RELEASE OF LIABILITY  For purposes of improving physical access to our facilities, [facility] is pleased to partner with third parties to provide [place] patients or other authorized individuals the option of convenient, on-demand ground transportation services (the [place]") through use of the technology service that enables users to request on-demand ground transportation from independent third-party providers.  By opting to use and accept these [employer], I, the undersigned, hereby agree on behalf of myself, and on behalf of any minor child using the Government social research officer for whom I am the parent or legal guardian, as follows:  Government social research officer provided to me are provided by independent third-party transportation providers who are not [employer] or employees and who are unaffiliated with [employer]. [place] is neither a transportation carrier nor a common or public carrier. [facility] has no control over the quality or safety of the transportation that occurs as a result of the [employer]. [facility] cannot guarantee that any third-party transportation provider will complete any arranged transportation service. [facility] makes no representation, warranty, or guarantee regarding the reliability, timeliness, quality, safety, suitability, or availability of any of the Transport Services or that they will be error free. I fully understand that traveling by vehicle involves risks and dangers of serious bodily injury, including permanent disability, paralysis, and death. I agree, on behalf of myself and on behalf of any minor child using the Transport Services for whom I am the parent or legal guardian, that the entire risk arising out of my use of the [employer] remains solely with me, to the maximum extent permitted under applicable law. The [employer] are provided "as  is" and "as available." [facility] disclaims all representations and warranties, express, implied or statutory, not expressly set out in these terms, including the implied warranties of merchantability and fitness for a particular purpose. I hereby waive and release [facility], its agents, employees, officers, directors, representatives, insurers, attorneys, assigns, successors, subsidiaries, and affiliates from any and all past, present, or future claims, demands, liabilities, actions, causes of action, or suits of any kind directly or indirectly arising from acceptance and use of the [employer]. I further waive and release [facility] and its affiliates from all present and future liability and responsibility for any injury or death to persons or damages to property caused by or related to the use of the [employer]. I have read this Waiver and Release of Liability, and I understand the terms used in it and their legal significance. This Waiver is freely and voluntarily given with the understanding that my right (as well as the right of any minor child for whom I am the parent or legal guardian using the [employer]) to legal recourse against  in connection with the [employer] is knowingly surrendered in return for use of these services.   I attest that I read the consent document to Joy [name], gave Ms. Pedregon the opportunity to ask questions and answered the questions asked (if any). I affirm that Joy [name] then provided consent for she's participation in this program.     Joy [name]

## 2021-10-21 ENCOUNTER — Other Ambulatory Visit: Payer: Self-pay | Admitting: Student

## 2021-10-26 ENCOUNTER — Other Ambulatory Visit: Payer: Self-pay

## 2021-10-26 ENCOUNTER — Ambulatory Visit
Admission: RE | Admit: 2021-10-26 | Discharge: 2021-10-26 | Disposition: A | Discharge status: Home / Self Care | Payer: Medicaid Other | Source: Ambulatory Visit | Attending: Radiation Oncology | Admitting: Radiation Oncology

## 2021-10-26 DIAGNOSIS — Z170 Estrogen receptor positive status [ER+]: Secondary | ICD-10-CM | POA: Diagnosis not present

## 2021-10-26 DIAGNOSIS — D0512 Intraductal carcinoma in situ of left breast: Secondary | ICD-10-CM | POA: Diagnosis not present

## 2021-10-26 DIAGNOSIS — D0511 Intraductal carcinoma in situ of right breast: Secondary | ICD-10-CM | POA: Diagnosis not present

## 2021-10-26 DIAGNOSIS — Z510 Encounter for antineoplastic radiation therapy: Secondary | ICD-10-CM | POA: Diagnosis not present

## 2021-10-26 LAB — RAD ONC ARIA SESSION SUMMARY
Course Elapsed Days: 0
Plan Fractions Treated to Date: 1
Plan Fractions Treated to Date: 1
Plan Prescribed Dose Per Fraction: 2.67 Gy
Plan Prescribed Dose Per Fraction: 2.67 Gy
Plan Total Fractions Prescribed: 15
Plan Total Fractions Prescribed: 15
Plan Total Prescribed Dose: 40.05 Gy
Plan Total Prescribed Dose: 40.05 Gy
Reference Point Dosage Given to Date: 2.67 Gy
Reference Point Dosage Given to Date: 2.67 Gy
Reference Point Session Dosage Given: 2.67 Gy
Reference Point Session Dosage Given: 2.67 Gy
Session Number: 1

## 2021-10-27 ENCOUNTER — Other Ambulatory Visit: Payer: Self-pay

## 2021-10-27 ENCOUNTER — Ambulatory Visit
Admission: RE | Admit: 2021-10-27 | Discharge: 2021-10-27 | Disposition: A | Discharge status: Home / Self Care | Payer: Medicaid Other | Source: Ambulatory Visit | Attending: Radiation Oncology | Admitting: Radiation Oncology

## 2021-10-27 DIAGNOSIS — D0511 Intraductal carcinoma in situ of right breast: Secondary | ICD-10-CM | POA: Diagnosis not present

## 2021-10-27 DIAGNOSIS — Z510 Encounter for antineoplastic radiation therapy: Secondary | ICD-10-CM | POA: Diagnosis not present

## 2021-10-27 DIAGNOSIS — Z170 Estrogen receptor positive status [ER+]: Secondary | ICD-10-CM | POA: Diagnosis not present

## 2021-10-27 DIAGNOSIS — D0512 Intraductal carcinoma in situ of left breast: Secondary | ICD-10-CM | POA: Diagnosis not present

## 2021-10-27 LAB — RAD ONC ARIA SESSION SUMMARY
Course Elapsed Days: 1
Plan Fractions Treated to Date: 2
Plan Fractions Treated to Date: 2
Plan Prescribed Dose Per Fraction: 2.67 Gy
Plan Prescribed Dose Per Fraction: 2.67 Gy
Plan Total Fractions Prescribed: 15
Plan Total Fractions Prescribed: 15
Plan Total Prescribed Dose: 40.05 Gy
Plan Total Prescribed Dose: 40.05 Gy
Reference Point Dosage Given to Date: 5.34 Gy
Reference Point Dosage Given to Date: 5.34 Gy
Reference Point Session Dosage Given: 2.67 Gy
Reference Point Session Dosage Given: 2.67 Gy
Session Number: 2

## 2021-10-30 ENCOUNTER — Other Ambulatory Visit: Payer: Self-pay

## 2021-10-30 ENCOUNTER — Ambulatory Visit
Admission: RE | Admit: 2021-10-30 | Discharge: 2021-10-30 | Disposition: A | Discharge status: Home / Self Care | Payer: Medicaid Other | Source: Ambulatory Visit | Attending: Radiation Oncology | Admitting: Radiation Oncology

## 2021-10-30 ENCOUNTER — Ambulatory Visit: Payer: Medicaid Other

## 2021-10-30 DIAGNOSIS — D0512 Intraductal carcinoma in situ of left breast: Secondary | ICD-10-CM | POA: Diagnosis not present

## 2021-10-30 DIAGNOSIS — Z510 Encounter for antineoplastic radiation therapy: Secondary | ICD-10-CM | POA: Diagnosis not present

## 2021-10-30 DIAGNOSIS — D0511 Intraductal carcinoma in situ of right breast: Secondary | ICD-10-CM | POA: Diagnosis not present

## 2021-10-30 DIAGNOSIS — Z170 Estrogen receptor positive status [ER+]: Secondary | ICD-10-CM | POA: Diagnosis not present

## 2021-10-30 LAB — RAD ONC ARIA SESSION SUMMARY
Course Elapsed Days: 4
Plan Fractions Treated to Date: 3
Plan Fractions Treated to Date: 3
Plan Prescribed Dose Per Fraction: 2.67 Gy
Plan Prescribed Dose Per Fraction: 2.67 Gy
Plan Total Fractions Prescribed: 15
Plan Total Fractions Prescribed: 15
Plan Total Prescribed Dose: 40.05 Gy
Plan Total Prescribed Dose: 40.05 Gy
Reference Point Dosage Given to Date: 8.01 Gy
Reference Point Dosage Given to Date: 8.01 Gy
Reference Point Session Dosage Given: 2.67 Gy
Reference Point Session Dosage Given: 2.67 Gy
Session Number: 3

## 2021-10-31 ENCOUNTER — Other Ambulatory Visit: Payer: Self-pay

## 2021-10-31 ENCOUNTER — Ambulatory Visit
Admission: RE | Admit: 2021-10-31 | Discharge: 2021-10-31 | Disposition: A | Discharge status: Home / Self Care | Payer: Medicaid Other | Source: Ambulatory Visit | Attending: Radiation Oncology | Admitting: Radiation Oncology

## 2021-10-31 DIAGNOSIS — Z510 Encounter for antineoplastic radiation therapy: Secondary | ICD-10-CM | POA: Diagnosis not present

## 2021-10-31 DIAGNOSIS — D0512 Intraductal carcinoma in situ of left breast: Secondary | ICD-10-CM | POA: Diagnosis not present

## 2021-10-31 DIAGNOSIS — D0511 Intraductal carcinoma in situ of right breast: Secondary | ICD-10-CM | POA: Diagnosis not present

## 2021-10-31 DIAGNOSIS — Z170 Estrogen receptor positive status [ER+]: Secondary | ICD-10-CM | POA: Diagnosis not present

## 2021-10-31 LAB — RAD ONC ARIA SESSION SUMMARY
Course Elapsed Days: 5
Plan Fractions Treated to Date: 4
Plan Fractions Treated to Date: 4
Plan Prescribed Dose Per Fraction: 2.67 Gy
Plan Prescribed Dose Per Fraction: 2.67 Gy
Plan Total Fractions Prescribed: 15
Plan Total Fractions Prescribed: 15
Plan Total Prescribed Dose: 40.05 Gy
Plan Total Prescribed Dose: 40.05 Gy
Reference Point Dosage Given to Date: 10.68 Gy
Reference Point Dosage Given to Date: 10.68 Gy
Reference Point Session Dosage Given: 2.67 Gy
Reference Point Session Dosage Given: 2.67 Gy
Session Number: 4

## 2021-10-31 MED ORDER — ALRA NON-METALLIC DEODORANT (RAD-ONC)
1.0000 | Freq: Once | TOPICAL | Status: AC
  Administered 2021-10-31: 1 via TOPICAL

## 2021-10-31 MED ORDER — RADIAPLEXRX EX GEL: Freq: Once | CUTANEOUS | Status: AC

## 2021-10-31 NOTE — Progress Notes (Signed)
Pt here for patient teaching. Pt given Radiation and You booklet, skin care instructions, Alra deodorant, and Radiaplex gel. Reviewed areas of pertinence such as fatigue, hair loss, nausea and vomiting, skin changes, breast tenderness, and breast swelling. Pt able to give teach back of to pat skin, use unscented/gentle soap, and drink plenty of water, apply Radiaplex bid, avoid applying anything to skin within 4 hours of treatment, avoid wearing an under wire bra, and to use an electric razor if they must shave. Pt verbalizes understanding of information given and will contact nursing with any questions or concerns.     [email or web address]

## 2021-11-01 ENCOUNTER — Ambulatory Visit
Admission: RE | Admit: 2021-11-01 | Discharge: 2021-11-01 | Disposition: A | Discharge status: Home / Self Care | Payer: Medicaid Other | Source: Ambulatory Visit | Attending: Radiation Oncology | Admitting: Radiation Oncology

## 2021-11-01 ENCOUNTER — Other Ambulatory Visit: Payer: Self-pay

## 2021-11-01 DIAGNOSIS — Z510 Encounter for antineoplastic radiation therapy: Secondary | ICD-10-CM | POA: Diagnosis not present

## 2021-11-01 DIAGNOSIS — D0512 Intraductal carcinoma in situ of left breast: Secondary | ICD-10-CM | POA: Diagnosis not present

## 2021-11-01 DIAGNOSIS — Z170 Estrogen receptor positive status [ER+]: Secondary | ICD-10-CM | POA: Diagnosis not present

## 2021-11-01 DIAGNOSIS — D0511 Intraductal carcinoma in situ of right breast: Secondary | ICD-10-CM | POA: Diagnosis not present

## 2021-11-01 LAB — RAD ONC ARIA SESSION SUMMARY
Course Elapsed Days: 6
Plan Fractions Treated to Date: 5
Plan Fractions Treated to Date: 5
Plan Prescribed Dose Per Fraction: 2.67 Gy
Plan Prescribed Dose Per Fraction: 2.67 Gy
Plan Total Fractions Prescribed: 15
Plan Total Fractions Prescribed: 15
Plan Total Prescribed Dose: 40.05 Gy
Plan Total Prescribed Dose: 40.05 Gy
Reference Point Dosage Given to Date: 13.35 Gy
Reference Point Dosage Given to Date: 13.35 Gy
Reference Point Session Dosage Given: 2.67 Gy
Reference Point Session Dosage Given: 2.67 Gy
Session Number: 5

## 2021-11-02 ENCOUNTER — Ambulatory Visit
Admission: RE | Admit: 2021-11-02 | Discharge: 2021-11-02 | Disposition: A | Discharge status: Home / Self Care | Payer: Medicaid Other | Source: Ambulatory Visit | Attending: Radiation Oncology | Admitting: Radiation Oncology

## 2021-11-02 ENCOUNTER — Other Ambulatory Visit: Payer: Self-pay

## 2021-11-02 DIAGNOSIS — Z170 Estrogen receptor positive status [ER+]: Secondary | ICD-10-CM | POA: Diagnosis not present

## 2021-11-02 DIAGNOSIS — Z510 Encounter for antineoplastic radiation therapy: Secondary | ICD-10-CM | POA: Diagnosis not present

## 2021-11-02 DIAGNOSIS — D0512 Intraductal carcinoma in situ of left breast: Secondary | ICD-10-CM | POA: Diagnosis not present

## 2021-11-02 DIAGNOSIS — D0511 Intraductal carcinoma in situ of right breast: Secondary | ICD-10-CM | POA: Diagnosis not present

## 2021-11-02 LAB — RAD ONC ARIA SESSION SUMMARY
Course Elapsed Days: 7
Plan Fractions Treated to Date: 6
Plan Fractions Treated to Date: 6
Plan Prescribed Dose Per Fraction: 2.67 Gy
Plan Prescribed Dose Per Fraction: 2.67 Gy
Plan Total Fractions Prescribed: 15
Plan Total Fractions Prescribed: 15
Plan Total Prescribed Dose: 40.05 Gy
Plan Total Prescribed Dose: 40.05 Gy
Reference Point Dosage Given to Date: 16.02 Gy
Reference Point Dosage Given to Date: 16.02 Gy
Reference Point Session Dosage Given: 2.67 Gy
Reference Point Session Dosage Given: 2.67 Gy
Session Number: 6

## 2021-11-03 ENCOUNTER — Other Ambulatory Visit: Payer: Self-pay | Admitting: Pulmonary Disease

## 2021-11-03 ENCOUNTER — Ambulatory Visit: Payer: Medicaid Other

## 2021-11-06 ENCOUNTER — Ambulatory Visit: Payer: Medicaid Other

## 2021-11-07 ENCOUNTER — Other Ambulatory Visit: Payer: Self-pay

## 2021-11-07 ENCOUNTER — Ambulatory Visit: Payer: Medicaid Other

## 2021-11-07 ENCOUNTER — Ambulatory Visit
Admission: RE | Admit: 2021-11-07 | Discharge: 2021-11-07 | Disposition: A | Discharge status: Home / Self Care | Payer: Medicaid Other | Source: Ambulatory Visit | Attending: Radiation Oncology | Admitting: Radiation Oncology

## 2021-11-07 DIAGNOSIS — Z510 Encounter for antineoplastic radiation therapy: Secondary | ICD-10-CM | POA: Diagnosis not present

## 2021-11-07 DIAGNOSIS — D0511 Intraductal carcinoma in situ of right breast: Secondary | ICD-10-CM | POA: Diagnosis not present

## 2021-11-07 DIAGNOSIS — Z170 Estrogen receptor positive status [ER+]: Secondary | ICD-10-CM | POA: Diagnosis not present

## 2021-11-07 DIAGNOSIS — D0512 Intraductal carcinoma in situ of left breast: Secondary | ICD-10-CM | POA: Diagnosis not present

## 2021-11-07 LAB — RAD ONC ARIA SESSION SUMMARY
Course Elapsed Days: 12
Plan Fractions Treated to Date: 7
Plan Fractions Treated to Date: 7
Plan Prescribed Dose Per Fraction: 2.67 Gy
Plan Prescribed Dose Per Fraction: 2.67 Gy
Plan Total Fractions Prescribed: 15
Plan Total Fractions Prescribed: 15
Plan Total Prescribed Dose: 40.05 Gy
Plan Total Prescribed Dose: 40.05 Gy
Reference Point Dosage Given to Date: 18.69 Gy
Reference Point Dosage Given to Date: 18.69 Gy
Reference Point Session Dosage Given: 2.67 Gy
Reference Point Session Dosage Given: 2.67 Gy
Session Number: 7

## 2021-11-08 ENCOUNTER — Other Ambulatory Visit: Payer: Self-pay

## 2021-11-08 ENCOUNTER — Ambulatory Visit
Admission: RE | Admit: 2021-11-08 | Discharge: 2021-11-08 | Disposition: A | Discharge status: Home / Self Care | Payer: Medicaid Other | Source: Ambulatory Visit | Attending: Radiation Oncology | Admitting: Radiation Oncology

## 2021-11-08 DIAGNOSIS — Z510 Encounter for antineoplastic radiation therapy: Secondary | ICD-10-CM | POA: Diagnosis not present

## 2021-11-08 DIAGNOSIS — Z170 Estrogen receptor positive status [ER+]: Secondary | ICD-10-CM | POA: Diagnosis not present

## 2021-11-08 DIAGNOSIS — D0511 Intraductal carcinoma in situ of right breast: Secondary | ICD-10-CM | POA: Diagnosis not present

## 2021-11-08 DIAGNOSIS — D0512 Intraductal carcinoma in situ of left breast: Secondary | ICD-10-CM | POA: Diagnosis not present

## 2021-11-08 LAB — RAD ONC ARIA SESSION SUMMARY
Course Elapsed Days: 13
Plan Fractions Treated to Date: 8
Plan Fractions Treated to Date: 8
Plan Prescribed Dose Per Fraction: 2.67 Gy
Plan Prescribed Dose Per Fraction: 2.67 Gy
Plan Total Fractions Prescribed: 15
Plan Total Fractions Prescribed: 15
Plan Total Prescribed Dose: 40.05 Gy
Plan Total Prescribed Dose: 40.05 Gy
Reference Point Dosage Given to Date: 21.36 Gy
Reference Point Dosage Given to Date: 21.36 Gy
Reference Point Session Dosage Given: 2.67 Gy
Reference Point Session Dosage Given: 2.67 Gy
Session Number: 8

## 2021-11-09 ENCOUNTER — Other Ambulatory Visit: Payer: Self-pay

## 2021-11-09 ENCOUNTER — Ambulatory Visit
Admission: RE | Admit: 2021-11-09 | Discharge: 2021-11-09 | Disposition: A | Discharge status: Home / Self Care | Payer: Medicaid Other | Source: Ambulatory Visit | Attending: Radiation Oncology | Admitting: Radiation Oncology

## 2021-11-09 DIAGNOSIS — D0512 Intraductal carcinoma in situ of left breast: Secondary | ICD-10-CM | POA: Diagnosis not present

## 2021-11-09 DIAGNOSIS — Z510 Encounter for antineoplastic radiation therapy: Secondary | ICD-10-CM | POA: Diagnosis not present

## 2021-11-09 DIAGNOSIS — Z170 Estrogen receptor positive status [ER+]: Secondary | ICD-10-CM | POA: Diagnosis not present

## 2021-11-09 DIAGNOSIS — D0511 Intraductal carcinoma in situ of right breast: Secondary | ICD-10-CM | POA: Diagnosis not present

## 2021-11-09 LAB — RAD ONC ARIA SESSION SUMMARY
Course Elapsed Days: 14
Plan Fractions Treated to Date: 9
Plan Fractions Treated to Date: 9
Plan Prescribed Dose Per Fraction: 2.67 Gy
Plan Prescribed Dose Per Fraction: 2.67 Gy
Plan Total Fractions Prescribed: 15
Plan Total Fractions Prescribed: 15
Plan Total Prescribed Dose: 40.05 Gy
Plan Total Prescribed Dose: 40.05 Gy
Reference Point Dosage Given to Date: 24.03 Gy
Reference Point Dosage Given to Date: 24.03 Gy
Reference Point Session Dosage Given: 2.67 Gy
Reference Point Session Dosage Given: 2.67 Gy
Session Number: 9

## 2021-11-10 ENCOUNTER — Ambulatory Visit
Admission: RE | Admit: 2021-11-10 | Discharge: 2021-11-10 | Disposition: A | Discharge status: Home / Self Care | Payer: Medicaid Other | Source: Ambulatory Visit | Attending: Radiation Oncology | Admitting: Radiation Oncology

## 2021-11-10 ENCOUNTER — Other Ambulatory Visit: Payer: Self-pay

## 2021-11-10 DIAGNOSIS — Z510 Encounter for antineoplastic radiation therapy: Secondary | ICD-10-CM | POA: Diagnosis not present

## 2021-11-10 DIAGNOSIS — Z170 Estrogen receptor positive status [ER+]: Secondary | ICD-10-CM | POA: Diagnosis not present

## 2021-11-10 DIAGNOSIS — D0511 Intraductal carcinoma in situ of right breast: Secondary | ICD-10-CM | POA: Diagnosis not present

## 2021-11-10 DIAGNOSIS — D0512 Intraductal carcinoma in situ of left breast: Secondary | ICD-10-CM | POA: Diagnosis not present

## 2021-11-10 LAB — RAD ONC ARIA SESSION SUMMARY
Course Elapsed Days: 15
Plan Fractions Treated to Date: 10
Plan Fractions Treated to Date: 10
Plan Prescribed Dose Per Fraction: 2.67 Gy
Plan Prescribed Dose Per Fraction: 2.67 Gy
Plan Total Fractions Prescribed: 15
Plan Total Fractions Prescribed: 15
Plan Total Prescribed Dose: 40.05 Gy
Plan Total Prescribed Dose: 40.05 Gy
Reference Point Dosage Given to Date: 26.7 Gy
Reference Point Dosage Given to Date: 26.7 Gy
Reference Point Session Dosage Given: 2.67 Gy
Reference Point Session Dosage Given: 2.67 Gy
Session Number: 10

## 2021-11-13 ENCOUNTER — Ambulatory Visit: Payer: Medicaid Other

## 2021-11-13 ENCOUNTER — Ambulatory Visit
Admission: RE | Admit: 2021-11-13 | Discharge: 2021-11-13 | Disposition: A | Discharge status: Home / Self Care | Payer: Medicaid Other | Source: Ambulatory Visit | Attending: Radiation Oncology | Admitting: Radiation Oncology

## 2021-11-13 ENCOUNTER — Other Ambulatory Visit: Payer: Self-pay

## 2021-11-13 DIAGNOSIS — Z510 Encounter for antineoplastic radiation therapy: Secondary | ICD-10-CM | POA: Diagnosis not present

## 2021-11-13 DIAGNOSIS — D0512 Intraductal carcinoma in situ of left breast: Secondary | ICD-10-CM | POA: Diagnosis not present

## 2021-11-13 DIAGNOSIS — D0511 Intraductal carcinoma in situ of right breast: Secondary | ICD-10-CM | POA: Diagnosis not present

## 2021-11-13 DIAGNOSIS — Z170 Estrogen receptor positive status [ER+]: Secondary | ICD-10-CM | POA: Diagnosis not present

## 2021-11-13 LAB — RAD ONC ARIA SESSION SUMMARY
Course Elapsed Days: 18
Plan Fractions Treated to Date: 11
Plan Fractions Treated to Date: 11
Plan Prescribed Dose Per Fraction: 2.67 Gy
Plan Prescribed Dose Per Fraction: 2.67 Gy
Plan Total Fractions Prescribed: 15
Plan Total Fractions Prescribed: 15
Plan Total Prescribed Dose: 40.05 Gy
Plan Total Prescribed Dose: 40.05 Gy
Reference Point Dosage Given to Date: 29.37 Gy
Reference Point Dosage Given to Date: 29.37 Gy
Reference Point Session Dosage Given: 2.67 Gy
Reference Point Session Dosage Given: 2.67 Gy
Session Number: 11

## 2021-11-14 ENCOUNTER — Ambulatory Visit
Admission: RE | Admit: 2021-11-14 | Discharge: 2021-11-14 | Disposition: A | Discharge status: Home / Self Care | Payer: Medicaid Other | Source: Ambulatory Visit | Attending: Radiation Oncology | Admitting: Radiation Oncology

## 2021-11-14 ENCOUNTER — Other Ambulatory Visit: Payer: Self-pay

## 2021-11-14 DIAGNOSIS — Z510 Encounter for antineoplastic radiation therapy: Secondary | ICD-10-CM | POA: Diagnosis not present

## 2021-11-14 DIAGNOSIS — D0511 Intraductal carcinoma in situ of right breast: Secondary | ICD-10-CM | POA: Diagnosis not present

## 2021-11-14 DIAGNOSIS — D0512 Intraductal carcinoma in situ of left breast: Secondary | ICD-10-CM | POA: Diagnosis not present

## 2021-11-14 DIAGNOSIS — Z170 Estrogen receptor positive status [ER+]: Secondary | ICD-10-CM | POA: Diagnosis not present

## 2021-11-14 LAB — RAD ONC ARIA SESSION SUMMARY
Course Elapsed Days: 19
Plan Fractions Treated to Date: 12
Plan Fractions Treated to Date: 12
Plan Prescribed Dose Per Fraction: 2.67 Gy
Plan Prescribed Dose Per Fraction: 2.67 Gy
Plan Total Fractions Prescribed: 15
Plan Total Fractions Prescribed: 15
Plan Total Prescribed Dose: 40.05 Gy
Plan Total Prescribed Dose: 40.05 Gy
Reference Point Dosage Given to Date: 32.04 Gy
Reference Point Dosage Given to Date: 32.04 Gy
Reference Point Session Dosage Given: 2.67 Gy
Reference Point Session Dosage Given: 2.67 Gy
Session Number: 12

## 2021-11-15 ENCOUNTER — Other Ambulatory Visit: Payer: Self-pay

## 2021-11-15 ENCOUNTER — Ambulatory Visit
Admission: RE | Admit: 2021-11-15 | Discharge: 2021-11-15 | Disposition: A | Discharge status: Home / Self Care | Payer: Medicaid Other | Source: Ambulatory Visit | Attending: Radiation Oncology | Admitting: Radiation Oncology

## 2021-11-15 DIAGNOSIS — Z170 Estrogen receptor positive status [ER+]: Secondary | ICD-10-CM | POA: Diagnosis not present

## 2021-11-15 DIAGNOSIS — Z510 Encounter for antineoplastic radiation therapy: Secondary | ICD-10-CM | POA: Diagnosis not present

## 2021-11-15 DIAGNOSIS — D0512 Intraductal carcinoma in situ of left breast: Secondary | ICD-10-CM | POA: Diagnosis not present

## 2021-11-15 DIAGNOSIS — D0511 Intraductal carcinoma in situ of right breast: Secondary | ICD-10-CM | POA: Diagnosis not present

## 2021-11-15 LAB — RAD ONC ARIA SESSION SUMMARY
Course Elapsed Days: 20
Plan Fractions Treated to Date: 13
Plan Fractions Treated to Date: 13
Plan Prescribed Dose Per Fraction: 2.67 Gy
Plan Prescribed Dose Per Fraction: 2.67 Gy
Plan Total Fractions Prescribed: 15
Plan Total Fractions Prescribed: 15
Plan Total Prescribed Dose: 40.05 Gy
Plan Total Prescribed Dose: 40.05 Gy
Reference Point Dosage Given to Date: 34.71 Gy
Reference Point Dosage Given to Date: 34.71 Gy
Reference Point Session Dosage Given: 2.67 Gy
Reference Point Session Dosage Given: 2.67 Gy
Session Number: 13

## 2021-11-16 ENCOUNTER — Ambulatory Visit: Payer: Medicaid Other

## 2021-11-16 ENCOUNTER — Ambulatory Visit
Admission: RE | Admit: 2021-11-16 | Discharge: 2021-11-16 | Disposition: A | Discharge status: Home / Self Care | Payer: Medicaid Other | Source: Ambulatory Visit | Attending: Radiation Oncology | Admitting: Radiation Oncology

## 2021-11-16 ENCOUNTER — Other Ambulatory Visit: Payer: Self-pay

## 2021-11-16 DIAGNOSIS — D0511 Intraductal carcinoma in situ of right breast: Secondary | ICD-10-CM | POA: Diagnosis not present

## 2021-11-16 DIAGNOSIS — Z170 Estrogen receptor positive status [ER+]: Secondary | ICD-10-CM | POA: Diagnosis not present

## 2021-11-16 DIAGNOSIS — Z510 Encounter for antineoplastic radiation therapy: Secondary | ICD-10-CM | POA: Diagnosis not present

## 2021-11-16 DIAGNOSIS — D0512 Intraductal carcinoma in situ of left breast: Secondary | ICD-10-CM | POA: Diagnosis not present

## 2021-11-16 LAB — RAD ONC ARIA SESSION SUMMARY
Course Elapsed Days: 21
Plan Fractions Treated to Date: 14
Plan Fractions Treated to Date: 14
Plan Prescribed Dose Per Fraction: 2.67 Gy
Plan Prescribed Dose Per Fraction: 2.67 Gy
Plan Total Fractions Prescribed: 15
Plan Total Fractions Prescribed: 15
Plan Total Prescribed Dose: 40.05 Gy
Plan Total Prescribed Dose: 40.05 Gy
Reference Point Dosage Given to Date: 37.38 Gy
Reference Point Dosage Given to Date: 37.38 Gy
Reference Point Session Dosage Given: 2.67 Gy
Reference Point Session Dosage Given: 2.67 Gy
Session Number: 14

## 2021-11-17 ENCOUNTER — Ambulatory Visit: Payer: Medicaid Other

## 2021-11-17 ENCOUNTER — Other Ambulatory Visit: Payer: Self-pay

## 2021-11-17 ENCOUNTER — Ambulatory Visit
Admission: RE | Admit: 2021-11-17 | Discharge: 2021-11-17 | Disposition: A | Discharge status: Home / Self Care | Payer: Medicaid Other | Source: Ambulatory Visit | Attending: Radiation Oncology | Admitting: Radiation Oncology

## 2021-11-17 DIAGNOSIS — D0511 Intraductal carcinoma in situ of right breast: Secondary | ICD-10-CM | POA: Insufficient documentation

## 2021-11-17 DIAGNOSIS — Z510 Encounter for antineoplastic radiation therapy: Secondary | ICD-10-CM | POA: Diagnosis not present

## 2021-11-17 DIAGNOSIS — D0512 Intraductal carcinoma in situ of left breast: Secondary | ICD-10-CM | POA: Diagnosis not present

## 2021-11-17 DIAGNOSIS — Z170 Estrogen receptor positive status [ER+]: Secondary | ICD-10-CM | POA: Diagnosis not present

## 2021-11-17 LAB — RAD ONC ARIA SESSION SUMMARY
Course Elapsed Days: 22
Plan Fractions Treated to Date: 15
Plan Fractions Treated to Date: 15
Plan Prescribed Dose Per Fraction: 2.67 Gy
Plan Prescribed Dose Per Fraction: 2.67 Gy
Plan Total Fractions Prescribed: 15
Plan Total Fractions Prescribed: 15
Plan Total Prescribed Dose: 40.05 Gy
Plan Total Prescribed Dose: 40.05 Gy
Reference Point Dosage Given to Date: 40.05 Gy
Reference Point Dosage Given to Date: 40.05 Gy
Reference Point Session Dosage Given: 2.67 Gy
Reference Point Session Dosage Given: 2.67 Gy
Session Number: 15

## 2021-11-21 ENCOUNTER — Other Ambulatory Visit: Payer: Self-pay

## 2021-11-21 ENCOUNTER — Ambulatory Visit: Payer: Medicaid Other

## 2021-11-21 ENCOUNTER — Ambulatory Visit
Admission: RE | Admit: 2021-11-21 | Discharge: 2021-11-21 | Disposition: A | Discharge status: Home / Self Care | Payer: Medicaid Other | Source: Ambulatory Visit | Attending: Radiation Oncology | Admitting: Radiation Oncology

## 2021-11-21 DIAGNOSIS — D0511 Intraductal carcinoma in situ of right breast: Secondary | ICD-10-CM | POA: Diagnosis not present

## 2021-11-21 LAB — RAD ONC ARIA SESSION SUMMARY
Course Elapsed Days: 26
Plan Fractions Treated to Date: 1
Plan Fractions Treated to Date: 1
Plan Prescribed Dose Per Fraction: 2 Gy
Plan Prescribed Dose Per Fraction: 2 Gy
Plan Total Fractions Prescribed: 5
Plan Total Fractions Prescribed: 5
Plan Total Prescribed Dose: 10 Gy
Plan Total Prescribed Dose: 10 Gy
Reference Point Dosage Given to Date: 2 Gy
Reference Point Dosage Given to Date: 2 Gy
Reference Point Session Dosage Given: 2 Gy
Reference Point Session Dosage Given: 2 Gy
Session Number: 16

## 2021-11-22 ENCOUNTER — Other Ambulatory Visit: Payer: Self-pay

## 2021-11-22 ENCOUNTER — Ambulatory Visit
Admission: RE | Admit: 2021-11-22 | Discharge: 2021-11-22 | Disposition: A | Discharge status: Home / Self Care | Payer: Medicaid Other | Source: Ambulatory Visit | Attending: Radiation Oncology | Admitting: Radiation Oncology

## 2021-11-22 DIAGNOSIS — D0511 Intraductal carcinoma in situ of right breast: Secondary | ICD-10-CM | POA: Diagnosis not present

## 2021-11-22 LAB — RAD ONC ARIA SESSION SUMMARY
Course Elapsed Days: 27
Plan Fractions Treated to Date: 2
Plan Fractions Treated to Date: 2
Plan Prescribed Dose Per Fraction: 2 Gy
Plan Prescribed Dose Per Fraction: 2 Gy
Plan Total Fractions Prescribed: 5
Plan Total Fractions Prescribed: 5
Plan Total Prescribed Dose: 10 Gy
Plan Total Prescribed Dose: 10 Gy
Reference Point Dosage Given to Date: 4 Gy
Reference Point Dosage Given to Date: 4 Gy
Reference Point Session Dosage Given: 2 Gy
Reference Point Session Dosage Given: 2 Gy
Session Number: 17

## 2021-11-23 ENCOUNTER — Ambulatory Visit: Payer: Medicaid Other

## 2021-11-23 ENCOUNTER — Other Ambulatory Visit: Payer: Self-pay

## 2021-11-23 ENCOUNTER — Ambulatory Visit
Admission: RE | Admit: 2021-11-23 | Discharge: 2021-11-23 | Disposition: A | Discharge status: Home / Self Care | Payer: Medicaid Other | Source: Ambulatory Visit | Attending: Radiation Oncology | Admitting: Radiation Oncology

## 2021-11-23 DIAGNOSIS — D0511 Intraductal carcinoma in situ of right breast: Secondary | ICD-10-CM | POA: Diagnosis not present

## 2021-11-23 LAB — RAD ONC ARIA SESSION SUMMARY
Course Elapsed Days: 28
Plan Fractions Treated to Date: 3
Plan Fractions Treated to Date: 3
Plan Prescribed Dose Per Fraction: 2 Gy
Plan Prescribed Dose Per Fraction: 2 Gy
Plan Total Fractions Prescribed: 5
Plan Total Fractions Prescribed: 5
Plan Total Prescribed Dose: 10 Gy
Plan Total Prescribed Dose: 10 Gy
Reference Point Dosage Given to Date: 6 Gy
Reference Point Dosage Given to Date: 6 Gy
Reference Point Session Dosage Given: 2 Gy
Reference Point Session Dosage Given: 2 Gy
Session Number: 18

## 2021-11-24 ENCOUNTER — Ambulatory Visit
Admission: RE | Admit: 2021-11-24 | Discharge: 2021-11-24 | Disposition: A | Discharge status: Home / Self Care | Payer: Medicaid Other | Source: Ambulatory Visit | Attending: Radiation Oncology | Admitting: Radiation Oncology

## 2021-11-24 ENCOUNTER — Other Ambulatory Visit: Payer: Self-pay

## 2021-11-24 ENCOUNTER — Ambulatory Visit: Payer: Medicaid Other

## 2021-11-24 DIAGNOSIS — D0511 Intraductal carcinoma in situ of right breast: Secondary | ICD-10-CM | POA: Diagnosis not present

## 2021-11-24 LAB — RAD ONC ARIA SESSION SUMMARY
Course Elapsed Days: 29
Plan Fractions Treated to Date: 4
Plan Fractions Treated to Date: 4
Plan Prescribed Dose Per Fraction: 2 Gy
Plan Prescribed Dose Per Fraction: 2 Gy
Plan Total Fractions Prescribed: 5
Plan Total Fractions Prescribed: 5
Plan Total Prescribed Dose: 10 Gy
Plan Total Prescribed Dose: 10 Gy
Reference Point Dosage Given to Date: 8 Gy
Reference Point Dosage Given to Date: 8 Gy
Reference Point Session Dosage Given: 2 Gy
Reference Point Session Dosage Given: 2 Gy
Session Number: 19

## 2021-11-27 ENCOUNTER — Ambulatory Visit
Admission: RE | Admit: 2021-11-27 | Discharge: 2021-11-27 | Disposition: A | Discharge status: Home / Self Care | Payer: Medicaid Other | Source: Ambulatory Visit | Attending: Radiation Oncology | Admitting: Radiation Oncology

## 2021-11-27 ENCOUNTER — Encounter: Payer: Self-pay | Admitting: Radiation Oncology

## 2021-11-27 ENCOUNTER — Other Ambulatory Visit: Payer: Self-pay

## 2021-11-27 DIAGNOSIS — D0511 Intraductal carcinoma in situ of right breast: Secondary | ICD-10-CM

## 2021-11-27 DIAGNOSIS — Z170 Estrogen receptor positive status [ER+]: Secondary | ICD-10-CM | POA: Diagnosis not present

## 2021-11-27 DIAGNOSIS — Z510 Encounter for antineoplastic radiation therapy: Secondary | ICD-10-CM | POA: Diagnosis not present

## 2021-11-27 DIAGNOSIS — D0512 Intraductal carcinoma in situ of left breast: Secondary | ICD-10-CM | POA: Diagnosis not present

## 2021-11-27 LAB — RAD ONC ARIA SESSION SUMMARY
Course Elapsed Days: 32
Plan Fractions Treated to Date: 5
Plan Fractions Treated to Date: 5
Plan Prescribed Dose Per Fraction: 2 Gy
Plan Prescribed Dose Per Fraction: 2 Gy
Plan Total Fractions Prescribed: 5
Plan Total Fractions Prescribed: 5
Plan Total Prescribed Dose: 10 Gy
Plan Total Prescribed Dose: 10 Gy
Reference Point Dosage Given to Date: 10 Gy
Reference Point Dosage Given to Date: 10 Gy
Reference Point Session Dosage Given: 2 Gy
Reference Point Session Dosage Given: 2 Gy
Session Number: 20

## 2021-11-27 MED ORDER — RADIAPLEXRX EX GEL: Freq: Once | CUTANEOUS | Status: AC

## 2021-11-28 ENCOUNTER — Other Ambulatory Visit: Payer: Self-pay | Admitting: Obstetrics and Gynecology

## 2021-11-28 NOTE — Patient Instructions (Signed)
Visit Information  Ms. Cheema was given information about Medicaid Managed Care team care coordination services as a part of their [place] benefit. [name] verbally consented to engagement with the [facility] Managed Care team.   If you are experiencing a medical emergency, please call 911 or report to your local emergency department or urgent care.   If you have a non-emergency medical problem during routine business hours, please contact your provider's office and ask to speak with a nurse.   For questions related to your [facility], please call: [phone] or visit the homepage here: [email or web address]  If you would like to schedule transportation through your [facility], please call the following number at least 2 days in advance of your appointment: [phone]   Rides for urgent appointments can also be made after hours by calling Member Services.  Call the [place] at [phone], at any time, 24 hours a day, 7 days a week. If you are in danger or need immediate medical attention call 911.  If you would like help to quit smoking, call 1-800-QUIT-NOW (540)010-3302) OR Espaol: 1-855-Djelo-Ya (0-340-352-4818) o para ms informacin haga clic aqu or Text READY to 200-400 to register via text  Ms. Pacha - following are the goals we discussed in your visit today:   Goals Addressed             This Visit's Progress    Protect My Health       Timeframe:  Long-Range Goal Priority:  High Start Date:    01/31/21                         Expected End Date:     ongoing                  Follow Up Date 01/01/22   - schedule appointment for flu shot - schedule appointment for vaccines needed due to my age or health - schedule recommended health tests (blood work, mammogram, colonoscopy, pap test) - schedule and keep  appointment for annual check-up    Why is this important?   Screening tests can find diseases early when they are easier to treat.  Your doctor or nurse will talk with you about which tests are important for you.  Getting shots for common diseases like the flu and shingles will help prevent them.     11/28/21:  Patient has completed radiation treatments, f/u 1 month-patient states no side effects.    Patient verbalizes understanding of instructions and care plan provided today and agrees to view in [place]. Active MyChart status and patient understanding of how to access instructions and care plan via MyChart confirmed with patient.     The Managed Medicaid care management team will reach out to the patient again over the next 30 business  days.  The  Patient  has been provided with contact information for the Managed Medicaid care management team and has been advised to call with any health related questions or concerns.   Aida Raider RN, BSN [place] Management Coordinator - Managed Medicaid High Risk [phone]   Following is a copy of your plan of care:  Care Plan : [place] of Care  Updates made by Gayla Medicus, RN since 11/28/2021 12:00 AM     Problem: Chronic Disease Management and Care Coordiantion Needs  Priority: High  Onset Date: 01/31/2021     Long-Range Goal: Establish Plan of Care for Chronic Disease Management Needs   Start Date: 01/31/2021  Expected End Date: 01/18/2022  Priority: High  Note:   Current Barriers:  Knowledge Deficits related to plan of care for management of chronic disease. Patient currently on 2L of Oxygen at night. Chronic Disease Management support and education needs related to tobacco use. 11/28/21:   Patient with bilateral DCIS s/p lumpectomy, radiation treatments completed yesterday-patient states no side effects.    Patient continues to smoke 3 cigarettes a day-has Chantix.  No complaints  today.  RNCM Clinical Goal(s):  Patient will verbalize understanding of plan for management of HTN, HF, COPD, GERD, tobacco use, pulmonary hypertension, chronic hypoxic respiratory failure verbalize basic understanding of  HTN, HF, COPD, GERD, tobacco use, pulmonary hypertension, chronic hypoxic respiratory failure  disease process and self health management plan  take all medications exactly as prescribed and will call provider for medication related questions demonstrate understanding of rationale for each prescribed medication  attend all scheduled medical appointments:  continue to work with RN Care Manager to address care management and care coordination needs related to  HTN, HF, COPD, GERD, tobacco use, pulmonary HTN, chronic hypoxic respiratory failure. work with pharmacist for medication review work with [employer] care guide to address needs related to  dental resources through collaboration with Consulting civil engineer, provider, and care team.   Interventions: Inter-disciplinary care team collaboration (see longitudinal plan of care) Evaluation of current treatment plan related to  self management and patient's adherence to plan as established by provider Pharmacy referral for medication review Collaborated with Pharmacist for medication review Collaborated with Care Guide for dental resources Care Guide referral for dental resources-completed. Assessed SDOH barriers  Oncology:  (Status: New goal.) Long Term Goal Assessment of understanding of oncology diagnosis:  Assessed patient understanding of cancer diagnosis and recommended treatment plan, Reviewed upcoming provider appointments and treatment appointments, Assessed available transportation to appointments and treatments. Has consistent/reliable transportation: Yes, and Assessed support system. Has consistent/reliable family or other support: Yes   Patient Goals/Self-Care Activities: Patient will self administer medications  as prescribed Patient will attend all scheduled provider appointments Patient will call pharmacy for medication refills Patient will continue to perform ADL's independently Patient will continue to perform IADL's independently Patient will call provider office for new concerns or questions  Follow Up Plan:  The patient has been provided with contact information for the care management team and has been advised to call with any health related questions or concerns.  The care management team will reach out to the patient again over the next 30 business  days.

## 2021-11-28 NOTE — Patient Outreach (Signed)
Medicaid Managed Care   Nurse Care Manager Note  11/28/2021 Name:  Joy [name] MRN:  [id number] DOB:  Nov 25, 1960  Joy [name] is an 61 y.o. year old female who is a primary patient of Sowell, Erlene Quan, MD.  The [facility] Managed Care Coordination team was consulted for assistance with:    Chronic healthcare management needs, tobacco use, DCIS, HTN, HF, COPD, GERD  [name] was given information about Medicaid Managed Care Coordination team services today. Joy [name] Patient agreed to services and verbal consent obtained.  Engaged with patient by telephone for follow up visit in response to provider referral for case management and/or care coordination services.   Assessments/Interventions:  Review of past medical history, allergies, medications, health status, including review of consultants reports, laboratory and other test data, was performed as part of comprehensive evaluation and provision of chronic care management services.  SDOH (Social Determinants of Health) assessments and interventions performed: SDOH Interventions    Flowsheet Row Patient Outreach Telephone from 11/28/2021 in [place] Work from 10/19/2021 in [place] Patient Outreach Telephone from 10/18/2021 in [place] Patient Outreach Telephone from 08/17/2021 in [place] Patient Outreach Telephone from 07/17/2021 in [place] Patient Outreach Telephone from 04/04/2021 in [place] Coordination  SDOH Interventions        Food Insecurity Interventions -- -- -- Intervention Not Indicated -- Intervention Not Indicated  Housing Interventions -- -- -- -- Intervention Not Indicated --  Transportation Interventions -- Payor Benefit -- Intervention Not Indicated  [patient states no difficulty  getting to appointments] -- [employer], Other (Comment)  [patient also given [employer] phone number]  Utilities Interventions Intervention Not Indicated -- -- -- -- --  Financial Strain Interventions -- -- Intervention Not Indicated -- -- --  Physical Activity Interventions Intervention Not Indicated, Other (Comments)  [patient just completed radiation treatments for 4 weeks] -- -- -- -- --  Stress Interventions -- -- Intervention Not Indicated -- -- --       Care Plan  No Known Allergies  Medications Reviewed Today     Reviewed by Gayla Medicus, RN (Registered Nurse) on 11/28/21 at [place] List Status: <None>   Medication Order Taking? Sig Documenting Provider Last Dose Status Informant  acetaZOLAMIDE (DIAMOX) 250 MG tablet [id number]  TAKE 1 TABLET BY MOUTH EVERY DAY Sowell, Brandon, MD  Active   atorvastatin (LIPITOR) 20 MG tablet [id number] No Take 1 tablet (20 mg total) by mouth daily. Sharion Settler, DO Taking Active   HYDROcodone-acetaminophen (NORCO/VICODIN) 5-325 MG tablet [id number]  Take 1 tablet by mouth every 6 (six) hours as needed. [provider]  Active   Multiple Vitamin (MULTIVITAMIN WITH MINERALS) TABS tablet [id number] No Take 1 tablet by mouth daily. [provider] Taking Active Self  spironolactone (ALDACTONE) 25 MG tablet [id number] No Take 1 tablet (25 mg total) by mouth daily. Gladys Damme, MD Taking Active   torsemide [facility]) 20 MG tablet [id number] No Take 1 tablet (20 mg total) by mouth daily. Gladys Damme, MD Taking Active   umeclidinium-vilanterol [facility] ELLIPTA) 62.5-25 MCG/ACT AEPB [id number] No Inhale 1 puff into the lungs daily. Hunsucker, Bonna Gains, MD Taking Active   varenicline (CHANTIX) 1 MG tablet [id number]  Take 1 tablet (1 mg total) by mouth 2 (two) times daily. Holley Bouche, MD  Active  VENTOLIN HFA 108 (90 Base) MCG/ACT inhaler [id number]  INHALE 2 PUFFS INTO THE LUNGS EVERY 4 HOURS AS  NEEDED FOR WHEEZING OR SHORTNESS OF BREATH. Hunsucker, Bonna Gains, MD  Active             Patient Active Problem List   Diagnosis Date Noted   Hyperlipidemia 08/11/2021   Screening for colon cancer 08/11/2021   Ductal carcinoma in situ (DCIS) of right breast 08/11/2021   Side effect of medication 04/26/2021   Annual physical exam 07/18/2020   Right heart failure ([place]) 05/03/2019   COPD (chronic obstructive pulmonary disease) ([place]) 08/10/2015   Pulmonary hypertension ([place])    HFpEF 08/08/2015   Essential hypertension    Tobacco abuse 05/16/2006   GASTROESOPHAGEAL REFLUX, NO ESOPHAGITIS 05/16/2006   Conditions to be addressed/monitored per PCP order:  Chronic healthcare management needs, tobacco use, DCIS, HTN, HF, COPD, GERD, HLD,  Care Plan : RN Care Manager Plan of Care  Updates made by Gayla Medicus, RN since 11/28/2021 12:00 AM     Problem: Chronic Disease Management and Care Coordiantion Needs   Priority: High  Onset Date: 01/31/2021     Long-Range Goal: Establish Plan of Care for Chronic Disease Management Needs   Start Date: 01/31/2021  Expected End Date: 01/18/2022  Priority: High  Note:   Current Barriers:  Knowledge Deficits related to plan of care for management of chronic disease. Patient currently on 2L of Oxygen at night. Chronic Disease Management support and education needs related to tobacco use. 11/28/21:   Patient with bilateral DCIS s/p lumpectomy, radiation treatments completed yesterday-patient states no side effects.    Patient continues to smoke 3 cigarettes a day-has Chantix.  No complaints today.  RNCM Clinical Goal(s):  Patient will verbalize understanding of plan for management of HTN, HF, COPD, GERD, tobacco use, pulmonary hypertension, chronic hypoxic respiratory failure verbalize basic understanding of  HTN, HF, COPD, GERD, tobacco use, pulmonary hypertension, chronic hypoxic respiratory failure  disease process and self health management plan   take all medications exactly as prescribed and will call provider for medication related questions demonstrate understanding of rationale for each prescribed medication  attend all scheduled medical appointments:  continue to work with RN Care Manager to address care management and care coordination needs related to  HTN, HF, COPD, GERD, tobacco use, pulmonary HTN, chronic hypoxic respiratory failure. work with pharmacist for medication review work with [employer] care guide to address needs related to  dental resources through collaboration with Consulting civil engineer, provider, and care team.   Interventions: Inter-disciplinary care team collaboration (see longitudinal plan of care) Evaluation of current treatment plan related to  self management and patient's adherence to plan as established by provider Pharmacy referral for medication review Collaborated with Pharmacist for medication review Collaborated with Care Guide for dental resources Care Guide referral for dental resources-completed. Assessed SDOH barriers  Oncology:  (Status: New goal.) Long Term Goal Assessment of understanding of oncology diagnosis:  Assessed patient understanding of cancer diagnosis and recommended treatment plan, Reviewed upcoming provider appointments and treatment appointments, Assessed available transportation to appointments and treatments. Has consistent/reliable transportation: Yes, and Assessed support system. Has consistent/reliable family or other support: Yes   Patient Goals/Self-Care Activities: Patient will self administer medications as prescribed Patient will attend all scheduled provider appointments Patient will call pharmacy for medication refills Patient will continue to perform ADL's independently Patient will continue to perform IADL's independently Patient will call provider office for new concerns or questions  Follow Up Plan:  The patient has been provided with contact  information for the care management team and has been advised to call with any health related questions or concerns.  The care management team will reach out to the patient again over the next 30 business  days.    Follow Up:  Patient agrees to Care Plan and Follow-up.  Plan: The Managed Medicaid care management team will reach out to the patient again over the next 30 business  days. and The  Patient has been provided with contact information for the Managed Medicaid care management team and has been advised to call with any health related questions or concerns.  Date/time of next scheduled RN care management/care coordination outreach: 01/01/22 at 315.

## 2021-12-04 ENCOUNTER — Telehealth: Payer: Self-pay

## 2021-12-04 ENCOUNTER — Telehealth: Payer: Self-pay | Admitting: Hematology and Oncology

## 2021-12-04 NOTE — Telephone Encounter (Signed)
Pt called to f/u on appt needed to see MD to discuss antiestrogen therapy. Pt reports she finished radiation 11/27/21 and would like to schedule visit with MD. Message sent to scheduler, Alver Fisher to get pt scheduled. Pt is aware and verbalized thanks.

## 2021-12-04 NOTE — Telephone Encounter (Signed)
Scheduled appointment per 9/18 staff message. Patient is aware.

## 2021-12-09 NOTE — Progress Notes (Signed)
Patient Care Team: Holley Bouche, MD as PCP - General (Family Medicine) Craft, Lorel Monaco, RN as Case Manager  DIAGNOSIS: No diagnosis found.  SUMMARY OF ONCOLOGIC HISTORY: Oncology History  Ductal carcinoma in situ (DCIS) of right breast  08/09/2021 Initial Diagnosis   Screening mammogram detected bilateral breast abnormalities Left breast biopsy: Intraductal papilloma with UDH and calcifications Right breast: Intraductal papilloma with UDH and calcifications and DCIS intermediate grade ER 100%, PR 90%    Surgery   Bilateral lumpectomies: Bilateral DCIS, right breast inferior margin positive, repeat surgery for margins: Additional DCIS was found in the additional margin.  Final margins are clear     CHIEF COMPLIANT: Follow-up DCIS  INTERVAL HISTORY: Joy [name] is a 61 y.o. female is here because of recent diagnosis of bilateral DCIS.  She presents to the clinic for a follow-up.    ALLERGIES:  has No Known Allergies.  MEDICATIONS:  Current Outpatient Medications  Medication Sig Dispense Refill   acetaZOLAMIDE (DIAMOX) 250 MG tablet TAKE 1 TABLET BY MOUTH EVERY DAY 30 tablet 0   atorvastatin (LIPITOR) 20 MG tablet Take 1 tablet (20 mg total) by mouth daily. 90 tablet 3   HYDROcodone-acetaminophen (NORCO/VICODIN) 5-325 MG tablet Take 1 tablet by mouth every 6 (six) hours as needed.     Multiple Vitamin (MULTIVITAMIN WITH MINERALS) TABS tablet Take 1 tablet by mouth daily.     spironolactone (ALDACTONE) 25 MG tablet Take 1 tablet (25 mg total) by mouth daily. 90 tablet 1   torsemide (DEMADEX) 20 MG tablet Take 1 tablet (20 mg total) by mouth daily. 90 tablet 1   umeclidinium-vilanterol ([facility] ELLIPTA) 62.5-25 MCG/ACT AEPB Inhale 1 puff into the lungs daily. 60 each 11   varenicline (CHANTIX) 1 MG tablet Take 1 tablet (1 mg total) by mouth 2 (two) times daily. 56 tablet 0   VENTOLIN HFA 108 (90 Base) MCG/ACT inhaler INHALE 2 PUFFS INTO THE LUNGS EVERY 4 HOURS AS NEEDED FOR  WHEEZING OR SHORTNESS OF BREATH. 18 each 3   No current facility-administered medications for this visit.    PHYSICAL EXAMINATION: ECOG PERFORMANCE STATUS: {CHL ONC ECOG PS:[id number]}  There were no vitals filed for this visit. There were no vitals filed for this visit.  BREAST:*** No palpable masses or nodules in either right or left breasts. No palpable axillary supraclavicular or infraclavicular adenopathy no breast tenderness or nipple discharge. (exam performed in the presence of a chaperone)  LABORATORY DATA:  I have reviewed the data as listed    Latest Ref Rng & Units 04/26/2021   11:11 AM 09/16/2019    3:36 PM 09/16/2019    3:26 PM  CMP  Glucose 70 - 99 mg/dL 90     BUN 8 - 27 mg/dL 13     Creatinine 0.57 - 1.00 mg/dL 0.86     Sodium 134 - 144 mmol/L 139  138  139   Potassium 3.5 - 5.2 mmol/L 4.3  4.4  4.1   Chloride 96 - 106 mmol/L 92     CO2 20 - 29 mmol/L 30     Calcium 8.7 - 10.3 mg/dL 10.1       Lab Results  Component Value Date   WBC 7.0 09/09/2019   HGB 13.9 09/16/2019   HCT 41.0 09/16/2019   MCV 95.2 09/09/2019   PLT 254 09/09/2019   NEUTROABS 2.4 04/30/2019    ASSESSMENT & PLAN:  No problem-specific Assessment & Plan notes found for this encounter.  No orders of the defined types were placed in this encounter.  The patient has a good understanding of the overall plan. she agrees with it. she will call with any problems that may develop before the next visit here. Total time spent: 30 mins including face to face time and time spent for planning, charting and co-ordination of care   Suzzette Righter, [place] 12/09/21    I Gardiner Coins am scribing for Dr. Lindi Adie  ***

## 2021-12-13 ENCOUNTER — Other Ambulatory Visit: Payer: Self-pay

## 2021-12-13 ENCOUNTER — Inpatient Hospital Stay: Payer: Medicaid Other | Attending: Hematology and Oncology | Admitting: Hematology and Oncology

## 2021-12-13 DIAGNOSIS — D0511 Intraductal carcinoma in situ of right breast: Secondary | ICD-10-CM | POA: Diagnosis not present

## 2021-12-13 MED ORDER — TAMOXIFEN CITRATE 20 MG PO TABS: 20.0000 mg | ORAL_TABLET | Freq: Every day | ORAL | 3 refills | Status: DC

## 2021-12-13 NOTE — Assessment & Plan Note (Signed)
08/09/2021: Screening mammogram detected bilateral breast abnormalities.  She underwent bilateral breast biopsies and subsequently went to [facility] where she underwent bilateral lumpectomies.  Right breast inferior margin was positive so she had to go back in for additional surgery and the final margins are negative.  DCIS was grade 2, ER 100%, PR 90% Adjuvant radiation: 10/27/2021-11/27/2021  Treatment plan: Tamoxifen 20 mg once daily x5 years We once again discussed the pros and cons of tamoxifen therapy.  Return to clinic in 3 months for survivorship care plan visit

## 2021-12-13 NOTE — Progress Notes (Signed)
                                                                                                                                                             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  Cancer Staging:   Stage 0 (cTis (DCIS), cN0, cM0) Left Breast, High-grade ductal carcinoma in-situ ER+ / PR- / Her2 not assessed; s/p lumpectomy with negative margins   Stage 0 (cTis (DCIS), cN0, cM0) Right Breast, Intermediate grade ductal carcinoma in-situ, ER+ / PR+ / Her2 not assessed; s/p lumpectomy and positive inferior margin re-excision    Intent: Curative  Radiation Treatment Dates: 10/26/2021 through 11/27/2021 Site Technique Total Dose (Gy) Dose per Fx (Gy) Completed Fx Beam Energies  Breast, Left: Breast_L 3D 40.05/40.05 2.67 15/15 6X  Breast, Left: Breast_L_Bst specialPort 10/10 2 5/5 9E, 12E  Breast, Right: Breast_R 3D 40.05/40.05 2.67 15/15 6X  Breast, Right: Breast_R_Bst 3D 10/10 2 5/5 6X, 10X   Narrative: The patient tolerated radiation therapy relatively well.   Plan: The patient will follow-up with radiation oncology in 1 mo and /or prn  -----------------------------------  Eppie Gibson, MD

## 2021-12-14 ENCOUNTER — Other Ambulatory Visit: Payer: Self-pay | Admitting: Family Medicine

## 2021-12-14 ENCOUNTER — Other Ambulatory Visit: Payer: Self-pay | Admitting: Student

## 2021-12-15 ENCOUNTER — Other Ambulatory Visit: Payer: Self-pay | Admitting: *Deleted

## 2021-12-15 MED ORDER — TORSEMIDE 20 MG PO TABS: 20.0000 mg | ORAL_TABLET | Freq: Every day | ORAL | 1 refills | Status: DC

## 2021-12-15 MED ORDER — SPIRONOLACTONE 25 MG PO TABS: 25.0000 mg | ORAL_TABLET | Freq: Every day | ORAL | 1 refills | Status: DC

## 2021-12-26 NOTE — Progress Notes (Incomplete)
Joy [name] presents today for follow-up after completing radiation to her right and left breasts on 11/27/2021  Pain: Skin:  ROM:  Lymphedema:  MedOnc F/U: Saw Dr. Lindi Adie on 12/13/2021 and is scheduled for the Survivorship Care Plan clinic with Mendel Ryder Causey-NP on 03/15/2022 Other issues of note:   Pt reports Yes No Comments  Tamoxifen _0  _1    Letrozole _2  _3    Anastrazole _4  _5    Mammogram _6  Date: TBD _7     The skin on her BIL breast has hyperpigmentation.  She denies feeling fatigued.  BP 108/73 (BP Location: Right Arm, Patient Position: Sitting, Cuff Size: Normal)   Pulse 81   Temp 97.9 F (36.6 C)   Resp 18   Ht 5' 6.5" (1.689 m)   Wt 153 lb 12.8 oz (69.8 kg)   SpO2 97%   BMI 24.45 kg/m

## 2021-12-27 ENCOUNTER — Encounter: Payer: Self-pay | Admitting: Radiation Oncology

## 2021-12-27 ENCOUNTER — Ambulatory Visit
Admission: RE | Admit: 2021-12-27 | Discharge: 2021-12-27 | Disposition: A | Discharge status: Home / Self Care | Payer: Medicaid Other | Source: Ambulatory Visit | Attending: Radiation Oncology | Admitting: Radiation Oncology

## 2021-12-27 VITALS — BP 108/73 | HR 81 | Temp 97.9°F | Resp 18 | Ht 66.5 in | Wt 153.8 lb

## 2021-12-27 DIAGNOSIS — D0511 Intraductal carcinoma in situ of right breast: Secondary | ICD-10-CM

## 2021-12-27 DIAGNOSIS — D0512 Intraductal carcinoma in situ of left breast: Secondary | ICD-10-CM | POA: Diagnosis not present

## 2021-12-27 DIAGNOSIS — Z170 Estrogen receptor positive status [ER+]: Secondary | ICD-10-CM | POA: Diagnosis not present

## 2021-12-27 DIAGNOSIS — Z79899 Other long term (current) drug therapy: Secondary | ICD-10-CM | POA: Insufficient documentation

## 2021-12-27 NOTE — Progress Notes (Signed)
Radiation Oncology         [phone] ________________________________  Name: Joy [name] MRN: [id number]  Date: 12/27/2021  DOB: October 06, 1960  Follow-Up Visit Note  outpatient  CC: Holley Bouche, MD  Holley Bouche, MD  Diagnosis and Prior Radiotherapy:    ICD-10-CM   1. Ductal carcinoma in situ (DCIS) of right breast  D05.11     2. Ductal carcinoma in situ (DCIS) of left breast  D05.12        Cancer Staging:   Stage 0 (cTis (DCIS), cN0, cM0) Left Breast, High-grade ductal carcinoma in-situ ER+ / PR- / Her2 not assessed; s/p lumpectomy with negative margins   Stage 0 (cTis (DCIS), cN0, cM0) Right Breast, Intermediate grade ductal carcinoma in-situ, ER+ / PR+ / Her2 not assessed; s/p lumpectomy and positive inferior margin re-excision    Intent: Curative  Radiation Treatment Dates: 10/26/2021 through 11/27/2021 Site Technique Total Dose (Gy) Dose per Fx (Gy) Completed Fx Beam Energies  Breast, Left: Breast_L 3D 40.05/40.05 2.67 15/15 6X  Breast, Left: Breast_L_Bst specialPort 10/10 2 5/5 9E, 12E  Breast, Right: Breast_R 3D 40.05/40.05 2.67 15/15 6X  Breast, Right: Breast_R_Bst 3D 10/10 2 5/5 6X, 10X     CHIEF COMPLAINT: Here for follow-up and surveillance of DCIS  Narrative:  The patient returns today for routine follow-up.  Joy [name] presents today for follow-up after completing radiation to her right and left breasts on 11/27/2021   MedOnc F/U: Saw Dr. Lindi Adie on 12/13/2021 and is scheduled for the Survivorship Care Plan clinic with Mendel Ryder Causey-NP on 03/15/2022 Other issues of note:   Pt reports Yes No Comments  Tamoxifen _0  _1    Letrozole _2  _3    Anastrazole _4  _5    Mammogram _6  Date: TBD _7     The skin on her BIL breast has hyperpigmentation.  She denies feeling fatigued.  BP 108/73 (BP Location: Right Arm, Patient Position: Sitting, Cuff Size: Normal)   Pulse 81   Temp 97.9 F (36.6 C)   Resp 18   Ht 5' 6.5" (1.689 m)   Wt 153 lb 12.8 oz  (69.8 kg)   SpO2 97%   BMI 24.45 kg/m                                ALLERGIES:  has No Known Allergies.  Meds: Current Outpatient Medications  Medication Sig Dispense Refill   acetaZOLAMIDE (DIAMOX) 250 MG tablet TAKE 1 TABLET BY MOUTH EVERY DAY 30 tablet 0   atorvastatin (LIPITOR) 20 MG tablet Take 1 tablet (20 mg total) by mouth daily. 90 tablet 3   Multiple Vitamin (MULTIVITAMIN WITH MINERALS) TABS tablet Take 1 tablet by mouth daily.     spironolactone (ALDACTONE) 25 MG tablet Take 1 tablet (25 mg total) by mouth daily. 90 tablet 1   tamoxifen (NOLVADEX) 20 MG tablet Take 1 tablet (20 mg total) by mouth daily. 90 tablet 3   torsemide (DEMADEX) 20 MG tablet Take 1 tablet (20 mg total) by mouth daily. 90 tablet 1   umeclidinium-vilanterol (ANORO ELLIPTA) 62.5-25 MCG/ACT AEPB Inhale 1 puff into the lungs daily. 60 each 11   varenicline (CHANTIX) 1 MG tablet Take 1 tablet (1 mg total) by mouth 2 (two) times daily. 56 tablet 0   VENTOLIN HFA 108 (90 Base) MCG/ACT inhaler INHALE 2 PUFFS INTO THE LUNGS EVERY 4 HOURS AS NEEDED FOR WHEEZING OR SHORTNESS OF BREATH. 18 each 3  HYDROcodone-acetaminophen (NORCO/VICODIN) 5-325 MG tablet Take 1 tablet by mouth every 6 (six) hours as needed. (Patient not taking: Reported on 12/27/2021)     No current facility-administered medications for this encounter.    Physical Findings: The patient is in no acute distress. Patient is alert and oriented.  height is 5' 6.5" (1.689 m) and weight is 153 lb 12.8 oz (69.8 kg). Her temperature is 97.9 F (36.6 C). Her blood pressure is 108/73 and her pulse is 81. Her respiration is 18 and oxygen saturation is 97%. .    Satisfactory skin healing in radiotherapy fields. Resolving hyperpigmentation and dryness bilaterally over chest    Lab Findings: Lab Results  Component Value Date   WBC 7.0 09/09/2019   HGB 13.9 09/16/2019   HCT 41.0 09/16/2019   MCV 95.2 09/09/2019   PLT 254 09/09/2019    Radiographic  Findings: No results found.  Impression/Plan: Healing well from radiotherapy to the breast tissue.  Continue skin care with topical lotion/vaseline (products she has at home) for at least 2 more months for further healing.  I encouraged her to continue with yearly mammography as appropriate (for intact breast tissue) and followup with medical oncology. I will see her back on an as-needed basis. I have encouraged her to call if she has any issues or concerns in the future. I wished her the very best.  On date of service, in total, I spent 10 minutes on this encounter. Patient was seen in person.  _____________________________________   Eppie Gibson, MD

## 2022-01-01 ENCOUNTER — Other Ambulatory Visit: Payer: Self-pay | Admitting: Obstetrics and Gynecology

## 2022-01-01 NOTE — Patient Outreach (Signed)
Medicaid Managed Care   Nurse Care Manager Note  01/01/2022 Name:  Joy [name] MRN:  [id number] DOB:  07-14-1960  Joy [name] is an 61 y.o. year old female who is a primary patient of Sowell, Erlene Quan, MD.  The [facility] Managed Care Coordination team was consulted for assistance with:    Chronic healthcare management needs, tobacco use, DCIS, HTN, HF, COPD  Joy [name] was given information about Medicaid Managed Care Coordination team services today. Joy [name] Patient agreed to services and verbal consent obtained.  Engaged with patient by telephone for follow up visit in response to provider referral for case management and/or care coordination services.   Assessments/Interventions:  Review of past medical history, allergies, medications, health status, including review of consultants reports, laboratory and other test data, was performed as part of comprehensive evaluation and provision of chronic care management services.  SDOH (Social Determinants of Health) assessments and interventions performed: SDOH Interventions    Flowsheet Row Patient Outreach Telephone from 01/01/2022 in [place] Patient Outreach Telephone from 11/28/2021 in [place] Work from 10/19/2021 in [place] [facility] Patient Outreach Telephone from 10/18/2021 in [place] Patient Outreach Telephone from 08/17/2021 in [place] Patient Outreach Telephone from 07/17/2021 in [place] Interventions        Food Insecurity Interventions -- -- -- -- Intervention Not Indicated --  Housing Interventions -- -- -- -- -- Intervention Not Indicated  Transportation Interventions -- -- Payor Benefit -- Intervention Not Indicated  [patient states no difficulty getting to appointments] --   Utilities Interventions -- Intervention Not Indicated -- -- -- --  Alcohol Usage Interventions Intervention Not Indicated (Score <7) -- -- -- -- --  Financial Strain Interventions -- -- -- Intervention Not Indicated -- --  Physical Activity Interventions -- Intervention Not Indicated, Other (Comments)  [patient just completed radiation treatments for 4 weeks] -- -- -- --  Stress Interventions -- -- -- Intervention Not Indicated -- --      Care Plan  No Known Allergies  Medications Reviewed Today     Reviewed by Gayla Medicus, RN (Registered Nurse) on 01/01/22 at 1526  Med List Status: <None>   Medication Order Taking? Sig Documenting Provider Last Dose Status Informant  acetaZOLAMIDE (DIAMOX) 250 MG tablet [id number] No TAKE 1 TABLET BY MOUTH EVERY DAY Sowell, Brandon, MD Taking Active   atorvastatin (LIPITOR) 20 MG tablet [id number] No Take 1 tablet (20 mg total) by mouth daily. Sharion Settler, DO Taking Active   HYDROcodone-acetaminophen (NORCO/VICODIN) 5-325 MG tablet [id number] No Take 1 tablet by mouth every 6 (six) hours as needed.  Patient not taking: Reported on 12/27/2021   [provider] Not Taking Active   Multiple Vitamin (MULTIVITAMIN WITH MINERALS) TABS tablet [id number] No Take 1 tablet by mouth daily. [provider] Taking Active Self  spironolactone (ALDACTONE) 25 MG tablet [id number] No Take 1 tablet (25 mg total) by mouth daily. Holley Bouche, MD Taking Active   tamoxifen (NOLVADEX) 20 MG tablet [id number] No Take 1 tablet (20 mg total) by mouth daily. Nicholas Lose, MD Taking Active   torsemide (DEMADEX) 20 MG tablet [id number] No Take 1 tablet (20 mg total) by mouth daily. Holley Bouche, MD Taking Active   umeclidinium-vilanterol [facility] ELLIPTA) 62.5-25 MCG/ACT AEPB [id number] No Inhale 1 puff into the lungs daily. Hunsucker, Bonna Gains,  MD Taking Active   varenicline (CHANTIX) 1 MG tablet [id number] No Take 1 tablet (1 mg total) by mouth 2 (two)  times daily. Holley Bouche, MD Taking Active   VENTOLIN HFA 108 [id number] Base) MCG/ACT inhaler [id number] No INHALE 2 PUFFS INTO THE LUNGS EVERY 4 HOURS AS NEEDED FOR WHEEZING OR SHORTNESS OF BREATH. Hunsucker, Bonna Gains, MD Taking Active            Patient Active Problem List   Diagnosis Date Noted   Hyperlipidemia 08/11/2021   Screening for colon cancer 08/11/2021   Ductal carcinoma in situ (DCIS) of right breast 08/11/2021   Side effect of medication 04/26/2021   Annual physical exam 07/18/2020   Right heart failure ([place]) 05/03/2019   COPD (chronic obstructive pulmonary disease) ([place]) 08/10/2015   Pulmonary hypertension ([place])    HFpEF 08/08/2015   Essential hypertension    Tobacco abuse 05/16/2006   GASTROESOPHAGEAL REFLUX, NO ESOPHAGITIS 05/16/2006   Conditions to be addressed/monitored per PCP order:  Chronic healthcare management needs, tobacco use, DCIS, HTN, HF, COPD, GERD, HLD  Care Plan : RN Care Manager Plan of Care  Updates made by Gayla Medicus, RN since 01/01/2022 12:00 AM     Problem: Chronic Disease Management and Care Coordiantion Needs   Priority: High  Onset Date: 01/31/2021     Long-Range Goal: Establish Plan of Care for Chronic Disease Management Needs   Start Date: 01/31/2021  Expected End Date: 04/03/2022  Priority: High  Note:   Current Barriers:  Knowledge Deficits related to plan of care for management of chronic disease. Patient currently on 2L of Oxygen at night. Chronic Disease Management support and education needs related to tobacco use. 01/01/22:  patient without complaint today-started on Tamoxifen. RNCM Clinical Goal(s):  Patient will verbalize understanding of plan for management of HTN, HF, COPD, GERD, tobacco use, pulmonary hypertension, chronic hypoxic respiratory failure verbalize basic understanding of  HTN, HF, COPD, GERD, tobacco use, pulmonary hypertension, chronic hypoxic respiratory failure  disease process and self health  management plan  take all medications exactly as prescribed and will call provider for medication related questions demonstrate understanding of rationale for each prescribed medication  attend all scheduled medical appointments:  continue to work with RN Care Manager to address care management and care coordination needs related to  HTN, HF, COPD, GERD, tobacco use, pulmonary HTN, chronic hypoxic respiratory failure. work with pharmacist for medication review work with [employer] care guide to address needs related to  dental resources through collaboration with Consulting civil engineer, provider, and care team.   Interventions: Inter-disciplinary care team collaboration (see longitudinal plan of care) Evaluation of current treatment plan related to  self management and patient's adherence to plan as established by provider Pharmacy referral for medication review Collaborated with Pharmacist for medication review Collaborated with Care Guide for dental resources Care Guide referral for dental resources-completed. Assessed SDOH barriers  Oncology:  (Status: New goal.) Long Term Goal Assessment of understanding of oncology diagnosis:  Assessed patient understanding of cancer diagnosis and recommended treatment plan, Reviewed upcoming provider appointments and treatment appointments, Assessed available transportation to appointments and treatments. Has consistent/reliable transportation: Yes, and Assessed support system. Has consistent/reliable family or other support: Yes   Patient Goals/Self-Care Activities: Patient will self administer medications as prescribed Patient will attend all scheduled provider appointments Patient will call pharmacy for medication refills Patient will continue to perform ADL's independently Patient will continue to perform IADL's independently Patient will call provider office for  new concerns or questions  Follow Up Plan:  The patient has been provided with  contact information for the care management team and has been advised to call with any health related questions or concerns.  The care management team will reach out to the patient again over the next 30 business  days.    Follow Up:  Patient agrees to Care Plan and Follow-up.  Plan: The Managed Medicaid care management team will reach out to the patient again over the next 30 business  days. and The  Patient has been provided with contact information for the Managed Medicaid care management team and has been advised to call with any health related questions or concerns.  Date/time of next scheduled RN care management/care coordination outreach:  01/31/22 at 0900.

## 2022-01-01 NOTE — Patient Instructions (Signed)
Visit Information  Ms. Zahniser was given information about Medicaid Managed Care team care coordination services as a part of their [place] Medicaid benefit. [name] verbally consented to engagement with the [facility] Managed Care team.   If you are experiencing a medical emergency, please call 911 or report to your local emergency department or urgent care.   If you have a non-emergency medical problem during routine business hours, please contact your provider's office and ask to speak with a nurse.   For questions related to your [facility], please call: [phone] or visit the homepage here: [email or web address]  If you would like to schedule transportation through your [facility], please call the following number at least 2 days in advance of your appointment: [phone]   Rides for urgent appointments can also be made after hours by calling Member Services.  Call the [place] at [phone], at any time, 24 hours a day, 7 days a week. If you are in danger or need immediate medical attention call 911.  If you would like help to quit smoking, call 1-800-QUIT-NOW 845 408 3468) OR Espaol: 1-855-Djelo-Ya (9-311-216-2446) o para ms informacin haga clic aqu or Text READY to 200-400 to register via text  Ms. Bussey - following are the goals we discussed in your visit today:   Goals Addressed             This Visit's Progress    Protect My Health       Timeframe:  Long-Range Goal Priority:  High Start Date:    01/31/21                         Expected End Date:     ongoing                  Follow Up Date 02/01/22   - schedule appointment for flu shot - schedule appointment for vaccines needed due to my age or health - schedule recommended health tests (blood work, mammogram, colonoscopy, pap test) - schedule and keep  appointment for annual check-up    Why is this important?   Screening tests can find diseases early when they are easier to treat.  Your doctor or nurse will talk with you about which tests are important for you.  Getting shots for common diseases like the flu and shingles will help prevent them.     01/01/22:  Started on Tamoxifen   Patient verbalizes understanding of instructions and care plan provided today and agrees to view in [place]. Active MyChart status and patient understanding of how to access instructions and care plan via MyChart confirmed with patient.     The Managed Medicaid care management team will reach out to the patient again over the next 30 business  days.  The  Patient  has been provided with contact information for the Managed Medicaid care management team and has been advised to call with any health related questions or concerns.  Aida Raider RN, BSN [place] Management Coordinator - Managed Medicaid High Risk [phone]   Following is a copy of your plan of care:  Care Plan : [place] of Care  Updates made by Gayla Medicus, RN since 01/01/2022 12:00 AM     Problem: Chronic Disease Management and Care Coordiantion Needs   Priority: High  Onset Date: 01/31/2021  Long-Range Goal: Establish Plan of Care for Chronic Disease Management Needs   Start Date: 01/31/2021  Expected End Date: 04/03/2022  Priority: High  Note:   Current Barriers:  Knowledge Deficits related to plan of care for management of chronic disease. Patient currently on 2L of Oxygen at night. Chronic Disease Management support and education needs related to tobacco use. 01/01/22:  patient without complaint today-started on Tamoxifen. RNCM Clinical Goal(s):  Patient will verbalize understanding of plan for management of HTN, HF, COPD, GERD, tobacco use, pulmonary hypertension, chronic hypoxic respiratory failure verbalize basic  understanding of  HTN, HF, COPD, GERD, tobacco use, pulmonary hypertension, chronic hypoxic respiratory failure  disease process and self health management plan  take all medications exactly as prescribed and will call provider for medication related questions demonstrate understanding of rationale for each prescribed medication  attend all scheduled medical appointments:  continue to work with RN Care Manager to address care management and care coordination needs related to  HTN, HF, COPD, GERD, tobacco use, pulmonary HTN, chronic hypoxic respiratory failure. work with pharmacist for medication review work with [employer] care guide to address needs related to  dental resources through collaboration with Consulting civil engineer, provider, and care team.   Interventions: Inter-disciplinary care team collaboration (see longitudinal plan of care) Evaluation of current treatment plan related to  self management and patient's adherence to plan as established by provider Pharmacy referral for medication review Collaborated with Pharmacist for medication review Collaborated with Care Guide for dental resources Care Guide referral for dental resources-completed. Assessed SDOH barriers  Oncology:  (Status: New goal.) Long Term Goal Assessment of understanding of oncology diagnosis:  Assessed patient understanding of cancer diagnosis and recommended treatment plan, Reviewed upcoming provider appointments and treatment appointments, Assessed available transportation to appointments and treatments. Has consistent/reliable transportation: Yes, and Assessed support system. Has consistent/reliable family or other support: Yes   Patient Goals/Self-Care Activities: Patient will self administer medications as prescribed Patient will attend all scheduled provider appointments Patient will call pharmacy for medication refills Patient will continue to perform ADL's independently Patient will continue to  perform IADL's independently Patient will call provider office for new concerns or questions  Follow Up Plan:  The patient has been provided with contact information for the care management team and has been advised to call with any health related questions or concerns.  The care management team will reach out to the patient again over the next 30 business  days.

## 2022-01-06 ENCOUNTER — Other Ambulatory Visit: Payer: Self-pay | Admitting: Student

## 2022-01-31 ENCOUNTER — Encounter: Payer: Self-pay | Admitting: Obstetrics and Gynecology

## 2022-01-31 ENCOUNTER — Other Ambulatory Visit: Payer: Medicaid Other | Admitting: Obstetrics and Gynecology

## 2022-01-31 NOTE — Patient Outreach (Signed)
Medicaid Managed Care   Nurse Care Manager Note  01/31/2022 Name:  Joy [name] MRN:  [id number] DOB:  12/14/1960  Joy [name] is an 61 y.o. year old female who is a primary patient of Sowell, Erlene Quan, MD.  The [facility] Managed Care Coordination team was consulted for assistance with:    Chronic healthcare management needs, tobacco use, DCIS, HTN, HF, COPD, GERD, HLD  Joy [name] was given information about Medicaid Managed Care Coordination team services today. Joy [name] Patient agreed to services and verbal consent obtained.  Engaged with patient by telephone for follow up visit in response to provider referral for case management and/or care coordination services.   Assessments/Interventions:  Review of past medical history, allergies, medications, health status, including review of consultants reports, laboratory and other test data, was performed as part of comprehensive evaluation and provision of chronic care management services.  SDOH (Social Determinants of Health) assessments and interventions performed: SDOH Interventions    Flowsheet Row Patient Outreach Telephone from 01/31/2022 in [place] Patient Outreach Telephone from 01/01/2022 in [place] Patient Outreach Telephone from 11/28/2021 in [place] Work from 10/19/2021 in [place] [facility] Patient Outreach Telephone from 10/18/2021 in [place] Patient Outreach Telephone from 08/17/2021 in [place] Interventions        Food Insecurity Interventions -- -- -- -- -- Intervention Not Indicated  Housing Interventions Intervention Not Indicated -- -- -- -- --  Transportation Interventions -- -- -- Payor Benefit -- Intervention Not Indicated  [patient states no difficulty getting to appointments]   Utilities Interventions -- -- Intervention Not Indicated -- -- --  Alcohol Usage Interventions -- Intervention Not Indicated (Score <7) -- -- -- --  Financial Strain Interventions -- -- -- -- Intervention Not Indicated --  Physical Activity Interventions -- -- Intervention Not Indicated, Other (Comments)  [patient just completed radiation treatments for 4 weeks] -- -- --  Stress Interventions -- -- -- -- Intervention Not Indicated --  Social Connections Interventions Intervention Not Indicated -- -- -- -- --     Care Plan  No Known Allergies  Medications Reviewed Today     Reviewed by Gayla Medicus, RN (Registered Nurse) on 01/31/22 at [place] List Status: <None>   Medication Order Taking? Sig Documenting Provider Last Dose Status Informant  acetaZOLAMIDE (DIAMOX) 250 MG tablet [id number]  TAKE 1 TABLET BY MOUTH EVERY DAY Sowell, Brandon, MD  Active   atorvastatin (LIPITOR) 20 MG tablet [id number] No Take 1 tablet (20 mg total) by mouth daily. Sharion Settler, DO Taking Active   HYDROcodone-acetaminophen (NORCO/VICODIN) 5-325 MG tablet [id number] No Take 1 tablet by mouth every 6 (six) hours as needed.  Patient not taking: Reported on 12/27/2021   [provider] Not Taking Active   Multiple Vitamin (MULTIVITAMIN WITH MINERALS) TABS tablet [id number] No Take 1 tablet by mouth daily. [provider] Taking Active Self  spironolactone (ALDACTONE) 25 MG tablet [id number] No Take 1 tablet (25 mg total) by mouth daily. Holley Bouche, MD Taking Active   tamoxifen (NOLVADEX) 20 MG tablet [id number] No Take 1 tablet (20 mg total) by mouth daily. Nicholas Lose, MD Taking Active   torsemide (DEMADEX) 20 MG tablet [id number] No Take 1 tablet (20 mg total) by mouth daily. Holley Bouche, MD Taking Active   umeclidinium-vilanterol [facility] ELLIPTA) 62.5-25 MCG/ACT  AEPB [id number] No Inhale 1 puff into the lungs daily. Hunsucker, Bonna Gains, MD Taking Active   varenicline (CHANTIX) 1  MG tablet [id number] No Take 1 tablet (1 mg total) by mouth 2 (two) times daily. Holley Bouche, MD Taking Active   VENTOLIN HFA 108 [phone] Base) MCG/ACT inhaler [id number] No INHALE 2 PUFFS INTO THE LUNGS EVERY 4 HOURS AS NEEDED FOR WHEEZING OR SHORTNESS OF BREATH. Hunsucker, Bonna Gains, MD Taking Active            Patient Active Problem List   Diagnosis Date Noted   Hyperlipidemia 08/11/2021   Screening for colon cancer 08/11/2021   Ductal carcinoma in situ (DCIS) of right breast 08/11/2021   Side effect of medication 04/26/2021   Annual physical exam 07/18/2020   Right heart failure ([place]) 05/03/2019   COPD (chronic obstructive pulmonary disease) ([place]) 08/10/2015   Pulmonary hypertension ([place])    HFpEF 08/08/2015   Essential hypertension    Tobacco abuse 05/16/2006   GASTROESOPHAGEAL REFLUX, NO ESOPHAGITIS 05/16/2006   Conditions to be addressed/monitored per PCP order:  Chronic healthcare management needs, tobacco use, DCIS, HTN, HF, COPD, GERD, HLD  Care Plan : RN Care Manager Plan of Care  Updates made by Gayla Medicus, RN since 01/31/2022 12:00 AM     Problem: Chronic Disease Management and Care Coordiantion Needs   Priority: High  Onset Date: 01/31/2021     Long-Range Goal: Establish Plan of Care for Chronic Disease Management Needs   Start Date: 01/31/2021  Expected End Date: 04/03/2022  Priority: High  Note:   Current Barriers:  Knowledge Deficits related to plan of care for management of chronic disease, HTN, HF, COPD, DCIS, GERD, HLD.  Patient currently on 2L of Oxygen at night. Chronic Disease Management support and education needs related to tobacco use. 01/31/22: Patient with no complaints today-being followed at [facility].  Remains on 2L of Oxygen and continues to smoke 3 cigarettes a day RNCM Clinical Goal(s):  Patient will verbalize understanding of plan for management of HTN, HF, COPD, GERD, tobacco use, pulmonary hypertension, chronic hypoxic  respiratory failure verbalize basic understanding of  HTN, HF, COPD, GERD, tobacco use, pulmonary hypertension, chronic hypoxic respiratory failure  disease process and self health management plan  take all medications exactly as prescribed and will call provider for medication related questions demonstrate understanding of rationale for each prescribed medication  attend all scheduled medical appointments:  continue to work with RN Care Manager to address care management and care coordination needs related to  HTN, HF, COPD, GERD, tobacco use, pulmonary HTN, chronic hypoxic respiratory failure. work with pharmacist for medication review work with [employer] care guide to address needs related to  dental resources through collaboration with Consulting civil engineer, provider, and care team.   Interventions: Inter-disciplinary care team collaboration (see longitudinal plan of care) Evaluation of current treatment plan related to  self management and patient's adherence to plan as established by provider Pharmacy referral for medication review Collaborated with Pharmacist for medication review Collaborated with Care Guide for dental resources Care Guide referral for dental resources-completed. Assessed SDOH barriers  Oncology:  (Status: New goal.) Long Term Goal Assessment of understanding of oncology diagnosis:  Assessed patient understanding of cancer diagnosis and recommended treatment plan, Reviewed upcoming provider appointments and treatment appointments, Assessed available transportation to appointments and treatments. Has consistent/reliable transportation: Yes, and Assessed support system. Has consistent/reliable family or other support: Yes   Patient Goals/Self-Care Activities: Patient will self administer  medications as prescribed Patient will attend all scheduled provider appointments Patient will call pharmacy for medication refills Patient will continue to perform ADL's  independently Patient will continue to perform IADL's independently Patient will call provider office for new concerns or questions  Follow Up Plan:  The patient has been provided with contact information for the care management team and has been advised to call with any health related questions or concerns.  The care management team will reach out to the patient again over the next 30 business  days.    Follow Up:  Patient agrees to Care Plan and Follow-up.  Plan: The Managed Medicaid care management team will reach out to the patient again over the next 30 business  days. and The  Patient has been provided with contact information for the Managed Medicaid care management team and has been advised to call with any health related questions or concerns.  Date/time of next scheduled RN care management/care coordination outreach: 03/05/22 at 1030.

## 2022-01-31 NOTE — Patient Instructions (Signed)
Visit Information  Joy [name] was given information about Medicaid Managed Care team care coordination services as a part of their [place] Medicaid benefit. [name] verbally consented to engagement with the [facility] Managed Care team.   If you are experiencing a medical emergency, please call 911 or report to your local emergency department or urgent care.   If you have a non-emergency medical problem during routine business hours, please contact your provider's office and ask to speak with a nurse.   For questions related to your [facility], please call: [phone] or visit the homepage here: [email or web address]  If you would like to schedule transportation through your [facility], please call the following number at least 2 days in advance of your appointment: [phone]   Rides for urgent appointments can also be made after hours by calling Member Services.  Call the [place] at [phone], at any time, 24 hours a day, 7 days a week. If you are in danger or need immediate medical attention call 911.  If you would like help to quit smoking, call 1-800-QUIT-NOW 559-280-2965) OR Espaol: 1-855-Djelo-Ya (0-623-762-8315) o para ms informacin haga clic aqu or Text READY to 200-400 to register via text  Joy [name] - following are the goals we discussed in your visit today:   Goals Addressed             This Visit's Progress    Protect My Health       Timeframe:  Long-Range Goal Priority:  High Start Date:    01/31/21                         Expected End Date:     ongoing                  Follow Up Date 03/02/22   - schedule appointment for flu shot - schedule appointment for vaccines needed due to my age or health - schedule recommended health tests (blood work, mammogram, colonoscopy, pap test) - schedule and keep  appointment for annual check-up    Why is this important?   Screening tests can find diseases early when they are easier to treat.  Your doctor or nurse will talk with you about which tests are important for you.  Getting shots for common diseases like the flu and shingles will help prevent them.     01/31/22:  Patient currently followed by [place].   The patient verbalized understanding of instructions, educational materials, and care plan provided today and DECLINED offer to receive copy of patient instructions, educational materials, and care plan.   The Managed Medicaid care management team will reach out to the patient again over the next 30 business  days.  The  Patient  has been provided with contact information for the Managed Medicaid care management team and has been advised to call with any health related questions or concerns.   Aida Raider RN, BSN [place] Management Coordinator - Managed Medicaid High Risk [phone]   Following is a copy of your plan of care:  Care Plan : [place] of Care  Updates made by Gayla Medicus, RN since 01/31/2022 12:00 AM     Problem: Chronic Disease Management and Care Coordiantion Needs   Priority: High  Onset Date: 01/31/2021     Long-Range Goal: Establish Plan of Care  for Chronic Disease Management Needs   Start Date: 01/31/2021  Expected End Date: 04/03/2022  Priority: High  Note:   Current Barriers:  Knowledge Deficits related to plan of care for management of chronic disease, HTN, HF, COPD, DCIS, GERD, HLD.  Patient currently on 2L of Oxygen at night. Chronic Disease Management support and education needs related to tobacco use. 01/31/22: Patient with no complaints today-being followed at the [facility].  Remains on 2L of Oxygen and continues to smoke 3 cigarettes a day RNCM Clinical Goal(s):  Patient will verbalize understanding of plan for management of HTN, HF, COPD,  GERD, tobacco use, pulmonary hypertension, chronic hypoxic respiratory failure verbalize basic understanding of  HTN, HF, COPD, GERD, tobacco use, pulmonary hypertension, chronic hypoxic respiratory failure  disease process and self health management plan  take all medications exactly as prescribed and will call provider for medication related questions demonstrate understanding of rationale for each prescribed medication  attend all scheduled medical appointments:  continue to work with RN Care Manager to address care management and care coordination needs related to  HTN, HF, COPD, GERD, tobacco use, pulmonary HTN, chronic hypoxic respiratory failure. work with pharmacist for medication review work with [employer] care guide to address needs related to  dental resources through collaboration with Consulting civil engineer, provider, and care team.   Interventions: Inter-disciplinary care team collaboration (see longitudinal plan of care) Evaluation of current treatment plan related to  self management and patient's adherence to plan as established by provider Pharmacy referral for medication review Collaborated with Pharmacist for medication review Collaborated with Care Guide for dental resources Care Guide referral for dental resources-completed. Assessed SDOH barriers  Oncology:  (Status: New goal.) Long Term Goal Assessment of understanding of oncology diagnosis:  Assessed patient understanding of cancer diagnosis and recommended treatment plan, Reviewed upcoming provider appointments and treatment appointments, Assessed available transportation to appointments and treatments. Has consistent/reliable transportation: Yes, and Assessed support system. Has consistent/reliable family or other support: Yes   Patient Goals/Self-Care Activities: Patient will self administer medications as prescribed Patient will attend all scheduled provider appointments Patient will call pharmacy for  medication refills Patient will continue to perform ADL's independently Patient will continue to perform IADL's independently Patient will call provider office for new concerns or questions  Follow Up Plan:  The patient has been provided with contact information for the care management team and has been advised to call with any health related questions or concerns.  The care management team will reach out to the patient again over the next 30 business  days.

## 2022-02-26 ENCOUNTER — Telehealth: Payer: Self-pay | Admitting: Hematology and Oncology

## 2022-02-26 NOTE — Telephone Encounter (Signed)
Rescheduled appointment per patient. She is aware of the new and updated appointment.

## 2022-03-05 ENCOUNTER — Encounter: Payer: Self-pay | Admitting: Obstetrics and Gynecology

## 2022-03-05 ENCOUNTER — Other Ambulatory Visit: Payer: Medicaid Other | Admitting: Obstetrics and Gynecology

## 2022-03-05 NOTE — Patient Outreach (Signed)
Medicaid Managed Care   Nurse Care Manager Note  03/05/2022 Name:  Joy [name] MRN:  [id number] DOB:  August 25, 1960  Joy [name] is an 61 y.o. year old female who is a primary patient of [name], Joy Quan, MD.  The [facility] Managed Care Coordination team was consulted for assistance with:    Chronic healthcare management needs, tobacco use, DCIS, HTN, COPD, GERD, HLD , HF  [name] was given information about Medicaid Managed Care Coordination team services today. Joy [name] Patient agreed to services and verbal consent obtained.  Engaged with patient by telephone for follow up visit in response to provider referral for case management and/or care coordination services.   Assessments/Interventions:  Review of past medical history, allergies, medications, health status, including review of consultants reports, laboratory and other test data, was performed as part of comprehensive evaluation and provision of chronic care management services.  SDOH (Social Determinants of Health) assessments and interventions performed: SDOH Interventions    Flowsheet Row Patient Outreach Telephone from 03/05/2022 in [place] Patient Outreach Telephone from 01/31/2022 in [place] [facility] Patient Outreach Telephone from 01/01/2022 in [place] Patient Outreach Telephone from 11/28/2021 in [place] Work from 10/19/2021 in [place] [facility] Patient Outreach Telephone from 10/18/2021 in [place] Interventions        Food Insecurity Interventions Intervention Not Indicated -- -- -- -- --  Housing Interventions -- Intervention Not Indicated -- -- -- --  Transportation Interventions -- -- -- -- Payor Benefit --  Utilities Interventions -- -- -- Intervention Not Indicated -- --  Alcohol Usage  Interventions -- -- Intervention Not Indicated (Score <7) -- -- --  Financial Strain Interventions -- -- -- -- -- Intervention Not Indicated  Physical Activity Interventions -- -- -- Intervention Not Indicated, Other (Comments)  [patient just completed radiation treatments for 4 weeks] -- --  Stress Interventions -- -- -- -- -- Intervention Not Indicated  Social Connections Interventions -- Intervention Not Indicated -- -- -- --      Care Plan  No Known Allergies  Medications Reviewed Today     Reviewed by Joy Medicus, RN (Registered Nurse) on 03/05/22 at [place] List Status: <None>   Medication Order Taking? Sig Documenting Provider Last Dose Status Informant  acetaZOLAMIDE (DIAMOX) 250 MG tablet [id number]  TAKE 1 TABLET BY MOUTH EVERY DAY [name], Brandon, MD  Active   atorvastatin (LIPITOR) 20 MG tablet [id number] No Take 1 tablet (20 mg total) by mouth daily. Joy Settler, DO Taking Active   HYDROcodone-acetaminophen (NORCO/VICODIN) 5-325 MG tablet [id number] No Take 1 tablet by mouth every 6 (six) hours as needed.  Patient not taking: Reported on 12/27/2021   [provider] Not Taking Active   Multiple Vitamin (MULTIVITAMIN WITH MINERALS) TABS tablet [id number] No Take 1 tablet by mouth daily. [provider] Taking Active Self  spironolactone (ALDACTONE) 25 MG tablet [id number] No Take 1 tablet (25 mg total) by mouth daily. Joy Bouche, MD Taking Active   tamoxifen (NOLVADEX) 20 MG tablet [id number] No Take 1 tablet (20 mg total) by mouth daily. Joy Lose, MD Taking Active   torsemide (DEMADEX) 20 MG tablet [id number] No Take 1 tablet (20 mg total) by mouth daily. Joy Bouche, MD Taking Active   umeclidinium-vilanterol [facility] ELLIPTA) 62.5-25 MCG/ACT AEPB [id number] No Inhale 1 puff into the lungs daily.  Hunsucker, Bonna Gains, MD Taking Active   varenicline (CHANTIX) 1 MG tablet [id number] No Take 1 tablet (1 mg total) by mouth 2 (two) times daily.  Joy Bouche, MD Taking Active   VENTOLIN HFA 108 [phone] Base) MCG/ACT inhaler [id number] No INHALE 2 PUFFS INTO THE LUNGS EVERY 4 HOURS AS NEEDED FOR WHEEZING OR SHORTNESS OF BREATH. Hunsucker, Bonna Gains, MD Taking Active            Patient Active Problem List   Diagnosis Date Noted   Hyperlipidemia 08/11/2021   Screening for colon cancer 08/11/2021   Ductal carcinoma in situ (DCIS) of right breast 08/11/2021   Side effect of medication 04/26/2021   Annual physical exam 07/18/2020   Right heart failure ([place]) 05/03/2019   COPD (chronic obstructive pulmonary disease) ([place]) 08/10/2015   Pulmonary hypertension ([place])    HFpEF 08/08/2015   Essential hypertension    Tobacco abuse 05/16/2006   GASTROESOPHAGEAL REFLUX, NO ESOPHAGITIS 05/16/2006   Conditions to be addressed/monitored per PCP order:  Chronic healthcare management needs, tobacco use, DCIS, HTN, COPD, GERD, HLD  Care Plan : RN Care Manager Plan of Care  Updates made by Joy Medicus, RN since 03/05/2022 12:00 AM     Problem: Chronic Disease Management and Care Coordiantion Needs   Priority: High  Onset Date: 01/31/2021     Long-Range Goal: Establish Plan of Care for Chronic Disease Management Needs   Start Date: 01/31/2021  Expected End Date: 06/04/2022  Priority: High  Note:   Current Barriers:  Knowledge Deficits related to plan of care for management of chronic disease, HTN, HF, COPD, DCIS, GERD, HLD.  Patient currently on 2L of Oxygen at night. Chronic Disease Management support and education needs related to tobacco use. 12/18//23: Patient's Brother died 01-Mar-2023 d/t stroke-offered grief counseling and declined at this time.  Patient with no complaints, smokes 3 cigarettes a day-using Chantix per patient.   Oxygen-2L at night.  Still being followed by the [place]. RNCM Clinical Goal(s):  Patient will verbalize understanding of plan for management of HTN, HF, COPD, GERD, tobacco use, pulmonary hypertension,  chronic hypoxic respiratory failure verbalize basic understanding of  HTN, HF, COPD, GERD, tobacco use, pulmonary hypertension, chronic hypoxic respiratory failure  disease process and self health management plan  take all medications exactly as prescribed and will call provider for medication related questions demonstrate understanding of rationale for each prescribed medication  attend all scheduled medical appointments:  continue to work with RN Care Manager to address care management and care coordination needs related to  HTN, HF, COPD, GERD, tobacco use, pulmonary HTN, chronic hypoxic respiratory failure. work with pharmacist for medication review work with [employer] care guide to address needs related to  dental resources through collaboration with Consulting civil engineer, provider, and care team.   Interventions: Inter-disciplinary care team collaboration (see longitudinal plan of care) Evaluation of current treatment plan related to  self management and patient's adherence to plan as established by provider Pharmacy referral for medication review Collaborated with Pharmacist for medication review Collaborated with Care Guide for dental resources Care Guide referral for dental resources-completed. Assessed SDOH barriers Discussed grief counseling with [facility]-declined at this time.  Oncology:  (Status: New goal.) Long Term Goal Assessment of understanding of oncology diagnosis:  Assessed patient understanding of cancer diagnosis and recommended treatment plan, Reviewed upcoming provider appointments and treatment appointments, Assessed available transportation to appointments and treatments. Has consistent/reliable transportation: Yes, and Assessed support system. Has consistent/reliable family or  other support: Yes   Patient Goals/Self-Care Activities: Patient will self administer medications as prescribed Patient will attend all scheduled provider appointments Patient will  call pharmacy for medication refills Patient will continue to perform ADL's independently Patient will continue to perform IADL's independently Patient will call provider office for new concerns or questions  Follow Up Plan:  The patient has been provided with contact information for the care management team and has been advised to call with any health related questions or concerns.  The care management team will reach out to the patient again over the next 30 business  days.    Follow Up:  Patient agrees to Care Plan and Follow-up.  Plan: The Managed Medicaid care management team will reach out to the patient again over the next 30 business  days. and The  Patient has been provided with contact information for the Managed Medicaid care management team and has been advised to call with any health related questions or concerns.  Date/time of next scheduled RN care management/care coordination outreach:  04/06/22 at 1030.

## 2022-03-05 NOTE — Patient Instructions (Signed)
Visit Information  Joy [name] was given information about Medicaid Managed Care team care coordination services as a part of their [place] Medicaid benefit. Joy [name] verbally consented to engagement with the [facility] Managed Care team.   If you are experiencing a medical emergency, please call 911 or report to your local emergency department or urgent care.   If you have a non-emergency medical problem during routine business hours, please contact your provider's office and ask to speak with a nurse.   For questions related to your [facility], please call: [phone] or visit the homepage here: [email or web address]  If you would like to schedule transportation through your [facility], please call the following number at least 2 days in advance of your appointment: [phone]   Rides for urgent appointments can also be made after hours by calling Member Services.  Call the [place] at [phone], at any time, 24 hours a day, 7 days a week. If you are in danger or need immediate medical attention call 911.  If you would like help to quit smoking, call 1-800-QUIT-NOW 310-201-7096) OR Espaol: 1-855-Djelo-Ya (1-779-390-3009) o para ms informacin haga clic aqu or Text READY to 200-400 to register via text  Ms. Scarfone - following are the goals we discussed in your visit today:   Goals Addressed             This Visit's Progress    Protect My Health       Timeframe:  Long-Range Goal Priority:  High Start Date:    01/31/21                         Expected End Date:     ongoing                  Follow Up Date 04/07/22   - schedule appointment for flu shot - schedule appointment for vaccines needed due to my age or health - schedule recommended health tests (blood work, mammogram, colonoscopy, pap test) - schedule and keep  appointment for annual check-up    Why is this important?   Screening tests can find diseases early when they are easier to treat.  Your doctor or nurse will talk with you about which tests are important for you.  Getting shots for common diseases like the flu and shingles will help prevent them.     03/05/22:  Patient currently followed by [facility].   The patient verbalized understanding of instructions, educational materials, and care plan provided today and DECLINED offer to receive copy of patient instructions, educational materials, and care plan.   The Managed Medicaid care management team will reach out to the patient again over the next 30 business  days.  The  Patient  has been provided with contact information for the Managed Medicaid care management team and has been advised to call with any health related questions or concerns.   Aida Raider RN, BSN [place] Management Coordinator - Managed Medicaid High Risk [phone]   Following is a copy of your plan of care:  Care Plan : [place] of Care  Updates made by Gayla Medicus, RN since 03/05/2022 12:00 AM     Problem: Chronic Disease Management and Care Coordiantion Needs   Priority: High  Onset Date: 01/31/2021     Long-Range Goal: Establish Plan of Care  for Chronic Disease Management Needs   Start Date: 01/31/2021  Expected End Date: 06/04/2022  Priority: High  Note:   Current Barriers:  Knowledge Deficits related to plan of care for management of chronic disease, HTN, HF, COPD, DCIS, GERD, HLD.  Patient currently on 2L of Oxygen at night. Chronic Disease Management support and education needs related to tobacco use. 12/18//23: Patient's Brother died 03-18-2023 d/t stroke-offered grief counseling and declined at this time.  Patient with no complaints, smokes 3 cigarettes a day-using Chantix per patient.   Oxygen-2L at night.  Still being followed by the [place]. RNCM Clinical Goal(s):  Patient will verbalize understanding of plan for management of HTN, HF, COPD, GERD, tobacco use, pulmonary hypertension, chronic hypoxic respiratory failure verbalize basic understanding of  HTN, HF, COPD, GERD, tobacco use, pulmonary hypertension, chronic hypoxic respiratory failure  disease process and self health management plan  take all medications exactly as prescribed and will call provider for medication related questions demonstrate understanding of rationale for each prescribed medication  attend all scheduled medical appointments:  continue to work with RN Care Manager to address care management and care coordination needs related to  HTN, HF, COPD, GERD, tobacco use, pulmonary HTN, chronic hypoxic respiratory failure. work with pharmacist for medication review work with [employer] care guide to address needs related to  dental resources through collaboration with Consulting civil engineer, provider, and care team.   Interventions: Inter-disciplinary care team collaboration (see longitudinal plan of care) Evaluation of current treatment plan related to  self management and patient's adherence to plan as established by provider Pharmacy referral for medication review Collaborated with Pharmacist for medication review Collaborated with Care Guide for dental resources Care Guide referral for dental resources-completed. Assessed SDOH barriers Discussed grief counseling with [facility]-declined at this time.  Oncology:  (Status: New goal.) Long Term Goal Assessment of understanding of oncology diagnosis:  Assessed patient understanding of cancer diagnosis and recommended treatment plan, Reviewed upcoming provider appointments and treatment appointments, Assessed available transportation to appointments and treatments. Has consistent/reliable transportation: Yes, and Assessed support system. Has consistent/reliable family or other support: Yes   Patient  Goals/Self-Care Activities: Patient will self administer medications as prescribed Patient will attend all scheduled provider appointments Patient will call pharmacy for medication refills Patient will continue to perform ADL's independently Patient will continue to perform IADL's independently Patient will call provider office for new concerns or questions  Follow Up Plan:  The patient has been provided with contact information for the care management team and has been advised to call with any health related questions or concerns.  The care management team will reach out to the patient again over the next 30 business  days.

## 2022-03-14 ENCOUNTER — Other Ambulatory Visit: Payer: Self-pay | Admitting: Family Medicine

## 2022-03-15 ENCOUNTER — Encounter: Payer: Medicaid Other | Admitting: Adult Health

## 2022-04-04 ENCOUNTER — Inpatient Hospital Stay: Payer: Medicaid Other | Attending: Adult Health | Admitting: Adult Health

## 2022-04-04 ENCOUNTER — Other Ambulatory Visit: Payer: Self-pay

## 2022-04-04 ENCOUNTER — Encounter: Payer: Self-pay | Admitting: Adult Health

## 2022-04-04 VITALS — BP 116/73 | HR 71 | Temp 97.3°F | Resp 14 | Wt 150.7 lb

## 2022-04-04 DIAGNOSIS — Z923 Personal history of irradiation: Secondary | ICD-10-CM | POA: Diagnosis not present

## 2022-04-04 DIAGNOSIS — D0511 Intraductal carcinoma in situ of right breast: Secondary | ICD-10-CM

## 2022-04-04 DIAGNOSIS — Z79810 Long term (current) use of selective estrogen receptor modulators (SERMs): Secondary | ICD-10-CM | POA: Diagnosis not present

## 2022-04-04 DIAGNOSIS — Z79899 Other long term (current) drug therapy: Secondary | ICD-10-CM | POA: Insufficient documentation

## 2022-04-04 DIAGNOSIS — F17210 Nicotine dependence, cigarettes, uncomplicated: Secondary | ICD-10-CM | POA: Diagnosis not present

## 2022-04-04 NOTE — Progress Notes (Signed)
SURVIVORSHIP VISIT:   BRIEF ONCOLOGIC HISTORY:  Oncology History  Ductal carcinoma in situ (DCIS) of right breast  08/09/2021 Initial Diagnosis   Screening mammogram detected bilateral breast abnormalities Left breast biopsy: Intraductal papilloma with UDH and calcifications Right breast: Intraductal papilloma with UDH and calcifications and DCIS intermediate grade ER 100%, PR 90%    Surgery   Bilateral lumpectomies: Bilateral DCIS, right breast inferior margin positive, repeat surgery for margins: Additional DCIS was found in the additional margin.  Final margins are clear   10/27/2021 - 11/27/2021 Radiation Therapy   Site Technique Total Dose (Gy) Dose per Fx (Gy) Completed Fx Beam Energies  Breast, Left: Breast_L 3D 40.05/40.05 2.67 15/15 6X  Breast, Left: Breast_L_Bst specialPort 10/10 2 5/5 9E, 12E  Breast, Right: Breast_R 3D 40.05/40.05 2.67 15/15 6X  Breast, Right: Breast_R_Bst 3D 10/10 2 5/5 6X, 10X     12/2021 -  Anti-estrogen oral therapy   Tamoxifen 20 mg once daily x5 years    04/04/2022 Cancer Staging   Staging form: Breast, AJCC 8th Edition - Clinical: Stage 0 (cTis (DCIS), cN0, cM0) - Signed by Gardenia Phlegm, NP on 04/04/2022     INTERVAL HISTORY:  [name] to review her survivorship care plan detailing her treatment course for breast cancer, as well as monitoring long-term side effects of that treatment, education regarding health maintenance, screening, and overall wellness and health promotion.     Overall, [name] reports feeling quite well. She is taking tamoxifen daily with good tolerance.  She has mild hot flashes at night however these do not impact her sleep.  We reviewed her cardiac risk below when she tells me that she had a cardiac catheterization a few years ago.  A-11.4% 10 year risk (intermediate) BP-controlled C-current every day smoker, on a statin D-healthy diet, normal BMI E-none REVIEW OF SYSTEMS:  Review of Systems   Constitutional:  Negative for appetite change, chills, fatigue, fever and unexpected weight change.  HENT:   Negative for hearing loss, lump/mass and trouble swallowing.   Eyes:  Negative for eye problems and icterus.  Respiratory:  Negative for chest tightness, cough and shortness of breath.   Cardiovascular:  Negative for chest pain, leg swelling and palpitations.  Gastrointestinal:  Negative for abdominal distention, abdominal pain, constipation, diarrhea, nausea and vomiting.  Endocrine: Positive for hot flashes.  Genitourinary:  Negative for difficulty urinating.   Musculoskeletal:  Negative for arthralgias.  Skin:  Negative for itching and rash.  Neurological:  Negative for dizziness, extremity weakness, headaches and numbness.  Hematological:  Negative for adenopathy. Does not bruise/bleed easily.  Psychiatric/Behavioral:  Negative for depression. The patient is not nervous/anxious.   Breast: Denies any new nodularity, masses, tenderness, nipple changes, or nipple discharge.     PAST MEDICAL/SURGICAL HISTORY:  Past Medical History:  Diagnosis Date   Acute congestive heart failure (HCC)    Acute dyspnea    Acute on chronic diastolic CHF (congestive heart failure) ([facility]) 08/11/2015   Acute right heart failure ([facility] [place]) 08/11/2015   Bilateral lower extremity edema    CHF (congestive heart failure) (HCC)    COPD (chronic obstructive pulmonary disease) ([facility]) 08/10/2015   Dx on XRAY 08/08/15.  Needs PFTs    GASTROESOPHAGEAL REFLUX, NO ESOPHAGITIS 05/16/2006   Qualifier: Diagnosis of  By: Drucie Ip     GERD (gastroesophageal reflux disease)    Hypertension    Hypervolemia    Leg swelling hospitalized 08/08/2015   BLE   PELVIC MASS 07/05/2008  Qualifier: Diagnosis of  By: McDiarmid MD, Todd     Pulmonary hypertension ([place])    Respiratory failure ([place]) 05/03/2019   Right heart failure ([place]) 08/10/2015   Tobacco abuse 05/16/2006   Qualifier: Diagnosis of  By: Drucie Ip      Past Surgical History:  Procedure Laterality Date   RIGHT HEART CATH N/A 09/16/2019   Procedure: RIGHT HEART CATH;  Surgeon: Jolaine Artist, MD;  Location: [facility];  Service: Cardiovascular;  Laterality: N/A;   RIGHT/LEFT HEART CATH AND CORONARY ANGIOGRAPHY N/A 12/26/2018   Procedure: RIGHT/LEFT HEART CATH AND CORONARY ANGIOGRAPHY;  Surgeon: Jolaine Artist, MD;  Location: [facility];  Service: Cardiovascular;  Laterality: N/A;   TUBAL LIGATION       ALLERGIES:  No Known Allergies   CURRENT MEDICATIONS:  Outpatient Encounter Medications as of 04/04/2022  Medication Sig   acetaZOLAMIDE (DIAMOX) 250 MG tablet TAKE 1 TABLET BY MOUTH EVERY DAY   atorvastatin (LIPITOR) 20 MG tablet Take 1 tablet (20 mg total) by mouth daily.   HYDROcodone-acetaminophen (NORCO/VICODIN) 5-325 MG tablet Take 1 tablet by mouth every 6 (six) hours as needed.   Multiple Vitamin (MULTIVITAMIN WITH MINERALS) TABS tablet Take 1 tablet by mouth daily.   spironolactone (ALDACTONE) 25 MG tablet Take 1 tablet (25 mg total) by mouth daily.   tamoxifen (NOLVADEX) 20 MG tablet Take 1 tablet (20 mg total) by mouth daily.   torsemide (DEMADEX) 20 MG tablet Take 1 tablet (20 mg total) by mouth daily.   umeclidinium-vilanterol (ANORO ELLIPTA) 62.5-25 MCG/ACT AEPB Inhale 1 puff into the lungs daily.   varenicline (CHANTIX) 1 MG tablet Take 1 tablet (1 mg total) by mouth 2 (two) times daily.   VENTOLIN HFA 108 (90 Base) MCG/ACT inhaler INHALE 2 PUFFS INTO THE LUNGS EVERY 4 HOURS AS NEEDED FOR WHEEZING OR SHORTNESS OF BREATH.   No facility-administered encounter medications on file as of 04/04/2022.     ONCOLOGIC FAMILY HISTORY:  Family History  Problem Relation Age of Onset   Aneurysm Mother    Hypertension Brother    Prostate cancer Brother      SOCIAL HISTORY:  Social History   Socioeconomic History   Marital status: Married    Spouse name: Not on file   Number of children: Not on  file   Years of education: Not on file   Highest education level: Not on file  Occupational History   Not on file  Tobacco Use   Smoking status: Every Day    Packs/day: 0.50    Years: 36.00    Total pack years: 18.00    Types: Cigarettes    Last attempt to quit: 04/20/2019    Years since quitting: 2.9   Smokeless tobacco: Former    Types: Chew   Tobacco comments:    "quit chewing in my early '20s"  Vaping Use   Vaping Use: Never used  Substance and Sexual Activity   Alcohol use: Yes    Alcohol/week: 23.0 standard drinks of alcohol    Types: 12 Cans of beer, 11 Shots of liquor per week    Comment: occasional   Drug use: Yes    Types: Marijuana    Comment: 08/08/2015 "I smoke marijuana when I have it; probably a couple times/week"   Sexual activity: Yes  Other Topics Concern   Not on file  Social History Narrative   Not on file   Social Determinants of Health   Financial Resource  Strain: Low Risk  (10/18/2021)   Overall Financial Resource Strain (CARDIA)    Difficulty of Paying Living Expenses: Not very hard  Food Insecurity: No Food Insecurity (03/05/2022)   Hunger Vital Sign    Worried About Running Out of Food in the Last Year: Never true    Ran Out of Food in the Last Year: Never true  Transportation Needs: Unmet Transportation Needs (10/19/2021)   PRAPARE - Hydrologist (Medical): Yes    Lack of Transportation (Non-Medical): No  Physical Activity: Inactive (11/28/2021)   Exercise Vital Sign    Days of Exercise per Week: 0 days    Minutes of Exercise per Session: 0 min  Stress: No Stress Concern Present (10/18/2021)   [place]    Feeling of Stress : Not at all  Social Connections: Moderately Isolated (01/31/2022)   Social Connection and Isolation Panel [NHANES]    Frequency of Communication with Friends and Family: More than three times a week    Frequency of Social  Gatherings with Friends and Family: More than three times a week    Attends Religious Services: Never    Marine scientist or Organizations: No    Attends Archivist Meetings: Never    Marital Status: Married  Human resources officer Violence: Not At Risk (01/01/2022)   Humiliation, Afraid, Rape, and Kick questionnaire    Fear of Current or Ex-Partner: No    Emotionally Abused: No    Physically Abused: No    Sexually Abused: No     OBSERVATIONS/OBJECTIVE:  BP 116/73 (BP Location: Left Arm, Patient Position: Sitting)   Pulse 71   Temp (!) 97.3 F (36.3 C) (Temporal)   Resp 14   Wt 150 lb 11.2 oz (68.4 kg)   SpO2 97%   BMI 23.96 kg/m  GENERAL: Patient is a well appearing female in no acute distress HEENT:  Sclerae anicteric.  Oropharynx clear and moist. No ulcerations or evidence of oropharyngeal candidiasis. Neck is supple.  NODES:  No cervical, supraclavicular, or axillary lymphadenopathy palpated.  BREAST EXAM:  bilateral breast s/p lumpectomy and radiation, no sign of local recurrence LUNGS:  Clear to auscultation bilaterally.  No wheezes or rhonchi. HEART:  Regular rate and rhythm. No murmur appreciated. ABDOMEN:  Soft, nontender.  Positive, normoactive bowel sounds. No organomegaly palpated. MSK:  No focal spinal tenderness to palpation. Full range of motion bilaterally in the upper extremities. EXTREMITIES:  No peripheral edema.   SKIN:  Clear with no obvious rashes or skin changes. No nail dyscrasia. NEURO:  Nonfocal. Well oriented.  Appropriate affect.   LABORATORY DATA:  None for this visit.  DIAGNOSTIC IMAGING:  None for this visit.      ASSESSMENT AND PLAN:  Ms.. Joy [name] is a pleasant 62 y.o. female with stage 0 bilateral breast DCIS, ER/PR positive,, diagnosed in May 2023, treated with lumpectomy, adjuvant radiation therapy, and anti-estrogen therapy with tamoxifen beginning in October 2023.  She presents to the Survivorship Clinic for our initial  meeting and routine follow-up post-completion of treatment for breast cancer.    1. Stage 0 bilateral breast cancer:  Ms. Viti is continuing to recover from definitive treatment for breast cancer. She will follow-up with her medical oncologist, Dr. Cleda Daub 6 months with history and physical exam per surveillance protocol.  She will continue her anti-estrogen therapy with Tamoxifen. Thus far, she is tolerating the Tamoxifen well, with minimal side effects. She  was instructed to make Dr. Lindi Adie or myself aware if she begins to experience any worsening side effects of the medication and I could see her back in clinic to help manage those side effects, as needed. Her mammogram is due 06/2022; orders placed today. Today, a comprehensive survivorship care plan and treatment summary was reviewed with the patient today detailing her breast cancer diagnosis, treatment course, potential late/long-term effects of treatment, appropriate follow-up care with recommendations for the future, and patient education resources.  A copy of this summary, along with a letter will be sent to the patient's primary care provider via mail/fax/In Basket message after today's visit.    2.  Intermediate cardiac risk: She is already undergone cardiac catheterization and is on a statin and has well-controlled blood pressure.  The 1 thing she could optimizes her exercise which I recommended today.  We discussed possible signs and symptoms of heart disease and she knows to reach out if these occur.  I reviewed that in women these are often sneaky and so if she begins to feel unusual or have any concerns we are always happy to see her back here.  3. Bone health: Her most recent bone density testing occurred in June 2023 and demonstrated osteoporosis.  I reviewed with her that tamoxifen has a protective effect on the bones which is a good thing.  She is recommended to repeat bone density testing in June 2025.  She was given education on  specific activities to promote bone health.  4. Cancer screening:  Due to Ms. Clayburn's history and her age, she should receive screening for skin cancers, colon cancer, and gynecologic cancers.  The information and recommendations are listed on the patient's comprehensive care plan/treatment summary and were reviewed in detail with the patient.    5. Health maintenance and wellness promotion: Ms. Ernandez was encouraged to consume 5-7 servings of fruits and vegetables per day. We reviewed the "Nutrition Rainbow" handout.  She was also encouraged to engage in moderate to vigorous exercise for 30 minutes per day most days of the week. She was instructed to limit her alcohol consumption and was encouraged to quit smoking.     6. Support services/counseling: It is not uncommon for this period of the patient's cancer care trajectory to be one of many emotions and stressors. She was given information regarding our available services and encouraged to contact me with any questions or for help enrolling in any of our support group/programs.    Follow up instructions:    -Return to cancer center in 6 months for follow-up with Dr. Lindi Adie -Mammogram due in April 2024 -Bone density due in June 2025 -She is welcome to return back to the Survivorship Clinic at any time; no additional follow-up needed at this time.  -Consider referral back to survivorship as a long-term survivor for continued surveillance  The patient was provided an opportunity to ask questions and all were answered. The patient agreed with the plan and demonstrated an understanding of the instructions.   Total encounter time:65 minutes*in face-to-face visit time, chart review, lab review, care coordination, order entry, and documentation of the encounter time.    Wilber Bihari, NP 04/04/22 11:22 AM Medical Oncology and Hematology [facility] [place], [place] [address] Tel. [phone]    Fax.  [phone]  *Total Encounter Time as defined by the Centers for Medicare and Medicaid Services includes, in addition to the face-to-face time of a patient visit (documented in the note  above) non-face-to-face time: obtaining and reviewing outside history, ordering and reviewing medications, tests or procedures, care coordination (communications with other health care professionals or caregivers) and documentation in the medical record.

## 2022-04-06 ENCOUNTER — Other Ambulatory Visit: Payer: Medicaid Other | Admitting: Obstetrics and Gynecology

## 2022-04-06 ENCOUNTER — Telehealth: Payer: Self-pay | Admitting: Adult Health

## 2022-04-06 NOTE — Patient Outreach (Signed)
  Medicaid Managed Care   Unsuccessful Attempt Note   04/06/2022 Name: Joy [name] MRN: [id number] DOB: May 16, 1960  Referred by: Holley Bouche, MD Reason for referral : High Risk Managed Medicaid (Unsuccessful telephone outreach)   An unsuccessful telephone outreach was attempted today. The patient was referred to the case management team for assistance with care management and care coordination.    Follow Up Plan: The Managed Medicaid care management team will reach out to the patient again over the next 30 business  days. and The  Patient has been provided with contact information for the Managed Medicaid care management team and has been advised to call with any health related questions or concerns.    Aida Raider RN, BSN [place]  [facility] - Managed Medicaid High Risk [phone]

## 2022-04-06 NOTE — Telephone Encounter (Signed)
Scheduled appointment per 1/17 los. Left voicemail for patient.

## 2022-04-06 NOTE — Patient Instructions (Signed)
Visit Information  [name]  - as a part of your Medicaid benefit, you are eligible for care management and care coordination services at no cost or copay. I was unable to reach you by phone today but would be happy to help you with your health related needs. Please feel free to call me at [phone]  A member of the Managed Medicaid care management team will reach out to you again over the next 30 business  days.   Aida Raider RN, BSN [place]  [facility] - Managed Medicaid High Risk [phone]

## 2022-04-20 ENCOUNTER — Encounter: Payer: Self-pay | Admitting: Obstetrics and Gynecology

## 2022-04-20 ENCOUNTER — Other Ambulatory Visit: Payer: Medicaid Other | Admitting: Obstetrics and Gynecology

## 2022-04-20 NOTE — Patient Outreach (Signed)
Care Coordination  04/20/2022  AUBERY DATE 1961/01/09 [phone]  RNCM returned patient's phone call.  Patient called to inform RNCM of new phone number,  phone number has been updated.  Aida Raider RN, BSN [place]  [facility]ator - Managed Medicaid High Risk [phone]

## 2022-05-03 ENCOUNTER — Other Ambulatory Visit: Payer: Medicaid Other | Admitting: Obstetrics and Gynecology

## 2022-05-03 NOTE — Patient Instructions (Signed)
Visit Information  [name]  - as a part of your Medicaid benefit, you are eligible for care management and care coordination services at no cost or copay. I was unable to reach you by phone today but would be happy to help you with your health related needs. Please feel free to call me at [phone].  A member of the Managed Medicaid care management team will reach out to you again over the next 30 business days.   Carlea Badour RN, BSN [place]  [facility] Care Management Coordinator - Managed Medicaid High Risk [phone]   

## 2022-05-03 NOTE — Patient Outreach (Signed)
  Medicaid Managed Care   Unsuccessful Attempt Note   05/03/2022 Name: Joy [name] MRN: [id number] DOB: April 23, 1960  Referred by: Holley Bouche, MD Reason for referral : High Risk Managed Medicaid (Unsuccessful telephone outreach)   An unsuccessful telephone outreach was attempted today. The patient was referred to the case management team for assistance with care management and care coordination.    Follow Up Plan: The Managed Medicaid care management team will reach out to the patient again over the next 30 business  days. and The  Patient has been provided with contact information for the Managed Medicaid care management team and has been advised to call with any health related questions or concerns.    Aida Raider RN, BSN [place]  [facility] - Managed Medicaid High Risk [phone]

## 2022-05-23 ENCOUNTER — Other Ambulatory Visit: Payer: Self-pay | Admitting: Student

## 2022-06-01 ENCOUNTER — Other Ambulatory Visit: Payer: Medicaid Other | Admitting: Obstetrics and Gynecology

## 2022-06-01 NOTE — Patient Instructions (Signed)
Visit Information  [name]  - as a part of your Medicaid benefit, you are eligible for care management and care coordination services at no cost or copay. I was unable to reach you by phone today but would be happy to help you with your health related needs. Please feel free to call me at [phone].  A member of the Managed Medicaid care management team will reach out to you again over the next 30 business days.   Ravi Tuccillo RN, BSN [place]  [facility] Care Management Coordinator - Managed Medicaid High Risk [phone]   

## 2022-06-01 NOTE — Patient Outreach (Signed)
Care Coordination  06/01/2022  [name] 25-Sep-1960 [id number]   Medicaid Managed Care   Unsuccessful Outreach Note  06/01/2022 Name: Joy [name] MRN: [id number] DOB: [birth year]  Referred by: Holley Bouche, MD Reason for referral : High Risk Managed Medicaid (Unsuccessful telephone outreach)  A second unsuccessful telephone outreach was attempted today. The patient was referred to the case management team for assistance with care management and care coordination.   Follow Up Plan: The patient has been provided with contact information for the care management team and has been advised to call with any health related questions or concerns.  The care management team will reach out to the patient again over the next 30 business  days.   Aida Raider RN, BSN [place]  [facility] - Managed Medicaid High Risk [phone]

## 2022-06-05 ENCOUNTER — Telehealth: Payer: Self-pay

## 2022-06-05 NOTE — Telephone Encounter (Signed)
..   Medicaid Managed Care   Unsuccessful Outreach Note  06/05/2022 Name: Joy [name] MRN: [id number] DOB: [birth year]  Referred by: Holley Bouche, MD Reason for referral : Appointment (I called to reschedule her missed phone appointment with the MM RNCM. She did not answer but I  left my name and number on her VM.)   Third unsuccessful telephone outreach was attempted today. The patient was referred to the case management team for assistance with care management and care coordination. The patient's primary care provider has been notified of our unsuccessful attempts to make or maintain contact with the patient. The care management team is pleased to engage with this patient at any time in the future should he/she be interested in assistance from the care management team.   Follow Up Plan: We have been unable to make contact with the patient for follow up. The care management team is available to follow up with the patient after provider conversation with the patient regarding recommendation for care management engagement and subsequent re-referral to the care management team.   [place], [place]

## 2022-06-13 ENCOUNTER — Other Ambulatory Visit: Payer: Self-pay | Admitting: Student

## 2022-06-14 ENCOUNTER — Other Ambulatory Visit: Payer: Medicaid Other | Admitting: Obstetrics and Gynecology

## 2022-06-14 NOTE — Patient Instructions (Signed)
Visit Information  [name]  - as a part of your Medicaid benefit, you are eligible for care management and care coordination services at no cost or copay. I was unable to reach you by phone today but would be happy to help you with your health related needs. Please feel free to call me at [phone].  Aida Raider RN, BSN [place]  [facility] - Managed Medicaid High Risk [phone]

## 2022-06-14 NOTE — Patient Outreach (Cosign Needed)
  Medicaid Managed Care   Unsuccessful Outreach Note  06/14/2022 Name: Joy [name] MRN: [id number] DOB: [birth year]  Referred by: Holley Bouche, MD Reason for referral : High Risk Managed Medicaid (Unsuccessful telephone outreach)  Third unsuccessful telephone outreach was attempted . The patient was referred to the case management team for assistance with care management and care coordination. The patient's primary care provider has been notified of our unsuccessful attempts to make or maintain contact with the patient. The care management team is pleased to engage with this patient at any time in the future should he/she be interested in assistance from the care management team.   Follow Up Plan: We have been unable to make contact with the patient for follow up. The care management team is available to follow up with the patient after provider conversation with the patient regarding recommendation for care management engagement and subsequent re-referral to the care management team.   Aida Raider RN, BSN [place] Management Coordinator - Managed [place] High Risk [phone]

## 2022-06-26 IMAGING — MG MM BREAST BX W LOC DEV 1ST LESION IMAGE BX SPEC STEREO GUIDE*L*
6 of 10 series · 6 of 22 positions shown · non-contrast
Comparison: Prior exams.
COMPARISON: Prior exams.

Addendum:
CLINICAL DATA: 60-year-old female with newly diagnosed left breast
DCIS presenting for additional biopsies of bilateral breast
calcifications.

EXAM:
LEFT BREAST STEREOTACTIC CORE NEEDLE BIOPSY X 1
RIGHT BREAST STEREOTACTIC CORE NEEDLE BIOPSY X 2

[L (1 of 6)]
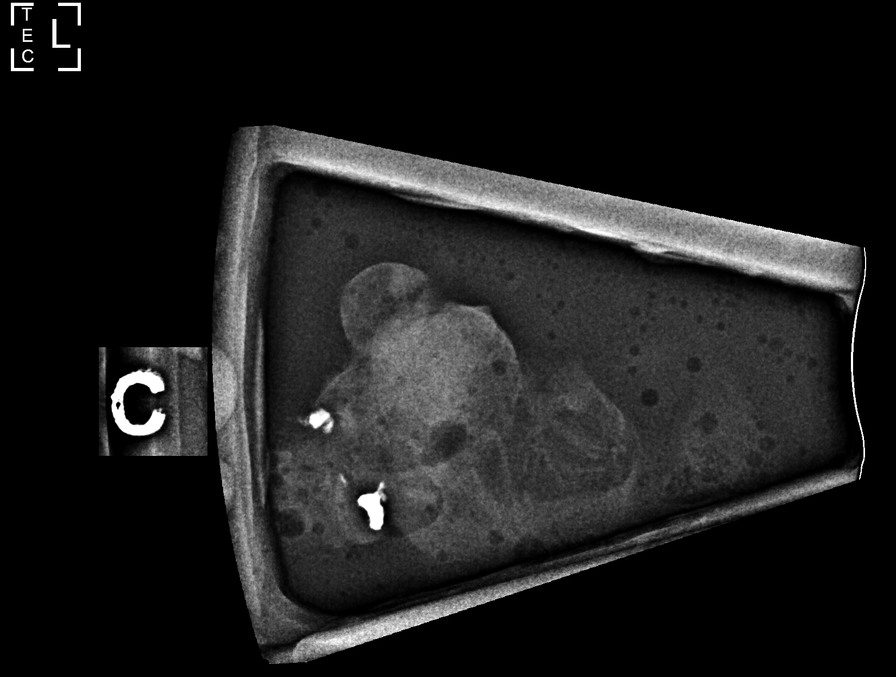

[L (2 of 6)]
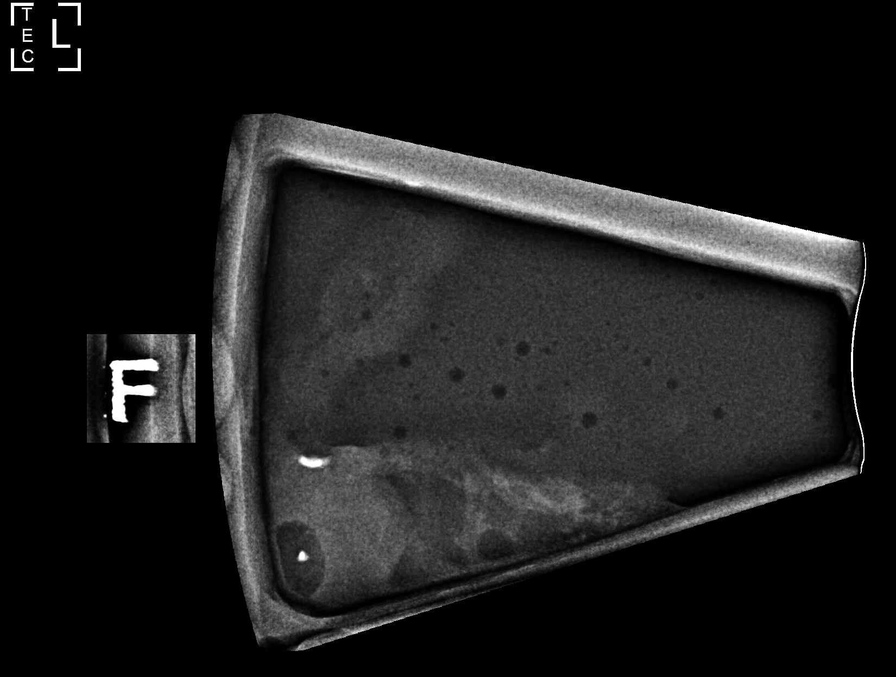

[L (3 of 6)]
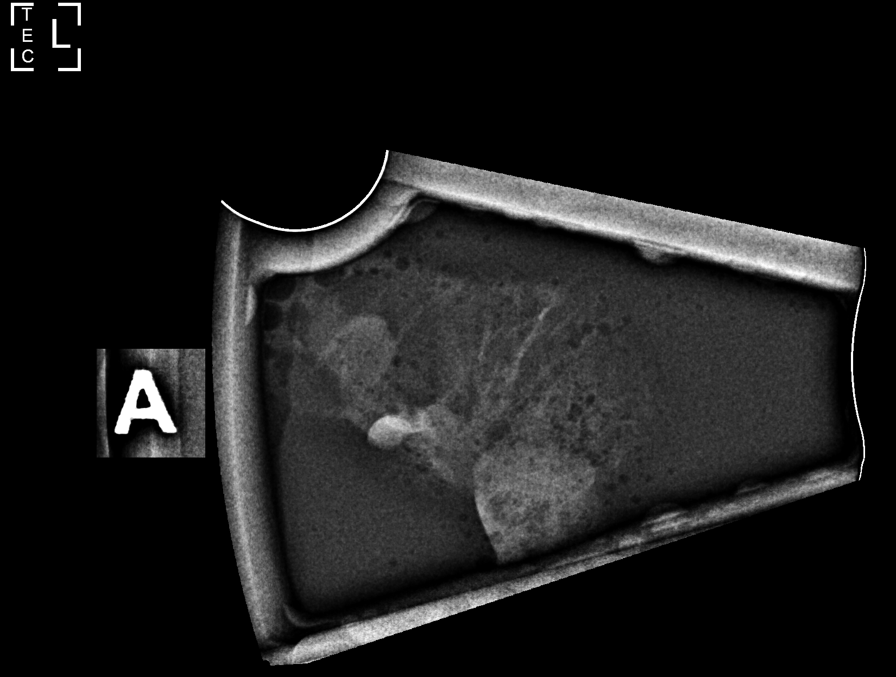

[L (4 of 6)]
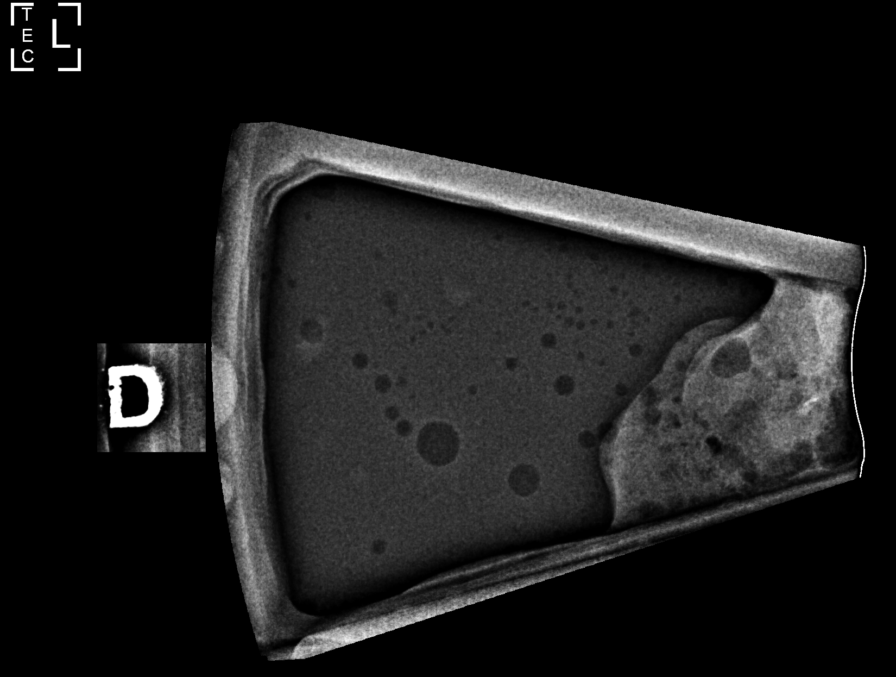

[L (5 of 6)]
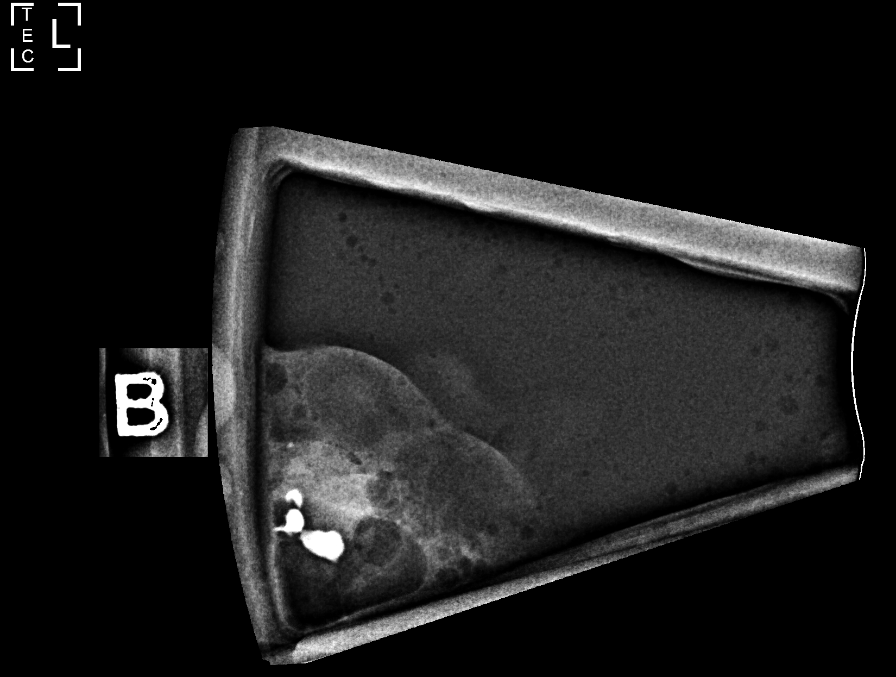

[L (6 of 6)]
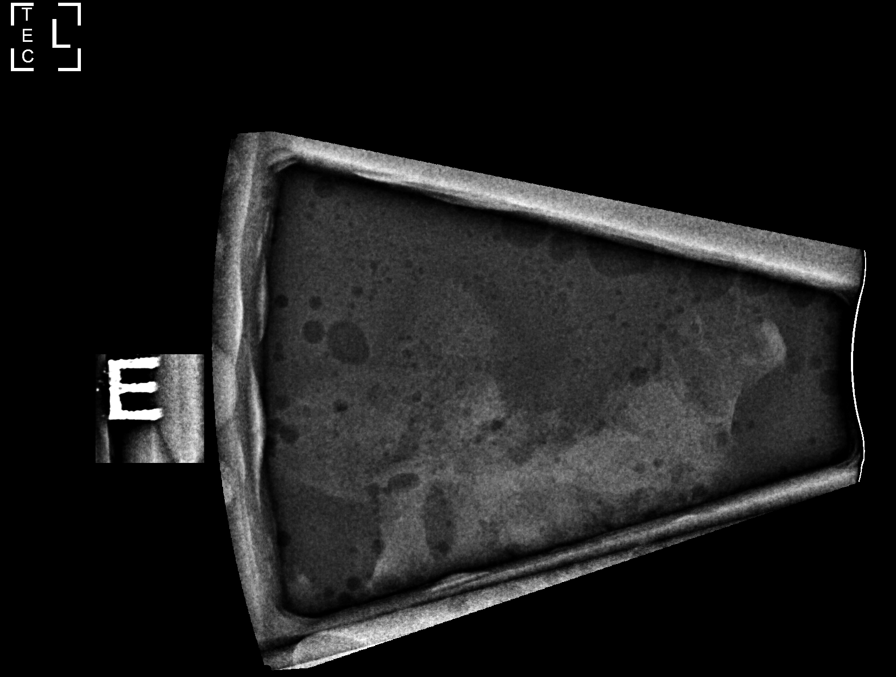

[6 of 22 positions shown; findings below may reference images not displayed]



1. Using sterile technique and 1% Lidocaine as local anesthetic,
under stereotactic guidance, a 9 gauge vacuum assisted device was
used to perform core needle biopsy of calcifications in the medial
retroareolar left breast using a medial approach. Specimen
radiograph was performed showing multiple specimens with
calcifications. Specimens with calcifications are identified for
pathology.

Lesion quadrant: Lower inner quadrant

At the conclusion of the procedure, a coil shaped tissue marker clip
was deployed into the biopsy cavity. Follow-up 2-view mammogram was
performed and dictated separately.

2. Using sterile technique and 1% Lidocaine as local anesthetic,
under stereotactic guidance, a 9 gauge vacuum assisted device was
used to perform core needle biopsy of calcifications in the upper
outer right breast using a superior approach. Specimen radiograph
was performed showing multiple specimens with calcifications.
Specimens with calcifications are identified for pathology.

Lesion quadrant: Upper outer quadrant

At the conclusion of the procedure, a ribbon shaped tissue marker
clip was deployed into the biopsy cavity. Follow-up 2-view mammogram
was performed and dictated separately.

Small post biopsy hematoma. Bleeding was completely stopped prior to
the patient leaving the office.

3. Using sterile technique and 1% Lidocaine as local anesthetic,
under stereotactic guidance, a 9 gauge vacuum assisted device was
used to perform core needle biopsy of calcifications in the lower
inner right breast using a medial approach. Specimen radiograph was
performed showing multiple specimens with calcifications. Specimens
with calcifications are identified for pathology.

Lesion quadrant: Lower inner quadrant

At the conclusion of the procedure, a coil shaped tissue marker clip
was deployed into the biopsy cavity. Follow-up 2-view mammogram was
performed and dictated separately.
IMPRESSION: Stereotactic-guided biopsy of calcifications in the medial
retroareolar left breast, of calcifications in the upper outer right
breast, and of calcifications in the lower inner right breast. Small
right breast post biopsy hematoma.

ADDENDUM:
Pathology revealed INTRADUCTAL PAPILLOMA AND FIBROCYSTIC CHANGES
WITH USUAL DUCTAL HYPERPLASIA AND CALCIFICATIONS of the LEFT breast,
retroareolar medial, (coil clip). This was found to be concordant by
Dr. Rolande Tefera, with surgical consultation for consideration
of excision recommended.

Pathology revealed INTRADUCTAL PAPILLOMA AND FIBROCYSTIC CHANGES
WITH USUAL DUCTAL HYPERPLASIA AND CALCIFICATIONS of the RIGHT
breast, upper outer, (ribbon clip). This was found to be concordant
by Dr. Rolande Tefera, with surgical consultation for
consideration of excision recommended.

Pathology revealed INTERMEDIATE GRADE DUCTAL CARCINOMA IN SITU WITH
CALCIFICATIONS of the RIGHT breast, lower inner, (coil clip). This
was found to be concordant by Dr. Rolande Tefera.

Pathology results were discussed with the patient by telephone. The
patient reported doing well after the biopsies with tenderness at
the sites. Post biopsy instructions and care were reviewed and
questions were answered. The patient was encouraged to call The
direct phone number was provided.

The patient has a recent diagnosis of LEFT breast cancer and should
follow her outlined treatment plan with Dr. Ursula Mun at [facility].

Biopsy results were sent to Dr. Osiya Ravza and Dr. Rabiu
Mamug via [REDACTED] message on August 10, 2021.

Pathology results reported by Steffen Melendes, RN on 08/10/2021.



1. Using sterile technique and 1% Lidocaine as local anesthetic,
under stereotactic guidance, a 9 gauge vacuum assisted device was
used to perform core needle biopsy of calcifications in the medial
retroareolar left breast using a medial approach. Specimen
radiograph was performed showing multiple specimens with
calcifications. Specimens with calcifications are identified for
pathology.

Lesion quadrant: Lower inner quadrant

At the conclusion of the procedure, a coil shaped tissue marker clip
was deployed into the biopsy cavity. Follow-up 2-view mammogram was
performed and dictated separately.

2. Using sterile technique and 1% Lidocaine as local anesthetic,
under stereotactic guidance, a 9 gauge vacuum assisted device was
used to perform core needle biopsy of calcifications in the upper
outer right breast using a superior approach. Specimen radiograph
was performed showing multiple specimens with calcifications.
Specimens with calcifications are identified for pathology.

Lesion quadrant: Upper outer quadrant

At the conclusion of the procedure, a ribbon shaped tissue marker
clip was deployed into the biopsy cavity. Follow-up 2-view mammogram
was performed and dictated separately.

Small post biopsy hematoma. Bleeding was completely stopped prior to
the patient leaving the office.

3. Using sterile technique and 1% Lidocaine as local anesthetic,
under stereotactic guidance, a 9 gauge vacuum assisted device was
used to perform core needle biopsy of calcifications in the lower
inner right breast using a medial approach. Specimen radiograph was
performed showing multiple specimens with calcifications. Specimens
with calcifications are identified for pathology.

Lesion quadrant: Lower inner quadrant

At the conclusion of the procedure, a coil shaped tissue marker clip
was deployed into the biopsy cavity. Follow-up 2-view mammogram was
performed and dictated separately.
IMPRESSION: Stereotactic-guided biopsy of calcifications in the medial
retroareolar left breast, of calcifications in the upper outer right
breast, and of calcifications in the lower inner right breast. Small
right breast post biopsy hematoma.

## 2022-08-25 ENCOUNTER — Other Ambulatory Visit: Payer: Self-pay | Admitting: Student

## 2022-10-03 ENCOUNTER — Telehealth: Payer: Self-pay

## 2022-10-03 ENCOUNTER — Inpatient Hospital Stay: Payer: Medicaid Other | Admitting: Hematology and Oncology

## 2022-10-03 NOTE — Telephone Encounter (Signed)
Returned Pt's regarding rescheduling today's MD appt. Pt states she has a cold and would like to reschedule. Pt denies fever, dehydration, or SOB. Appt rescheduled. Pt states she was "supposed to have a mammogram in April but no one ever call me". Pt states her new number is [phone]. Updated Pt's new number in EPIC. Sent message to Lucia Gaskins from [facility] to call Pt to schedule. Pt verbalized understanding.

## 2022-10-03 NOTE — Assessment & Plan Note (Deleted)
08/09/2021: Screening mammogram detected bilateral breast abnormalities.  She underwent bilateral breast biopsies and subsequently went to [facility] where she underwent bilateral lumpectomies.  Right breast inferior margin was positive so she had to go back in for additional surgery and the final margins are negative.  DCIS was grade 2, ER 100%, PR 90% Adjuvant radiation: 10/27/2021-11/27/2021   Treatment plan: Tamoxifen 20 mg once daily x5 years started 12/13/21 Tamoxifen Toxicities:  Breast Cancer Surveillance: Breast Exam: 10/03/22: Benign Mammogram

## 2022-10-08 ENCOUNTER — Ambulatory Visit
Admission: RE | Admit: 2022-10-08 | Discharge: 2022-10-08 | Disposition: A | Discharge status: Home / Self Care | Payer: Medicaid Other | Source: Ambulatory Visit | Attending: Adult Health | Admitting: Adult Health

## 2022-10-08 DIAGNOSIS — Z853 Personal history of malignant neoplasm of breast: Secondary | ICD-10-CM | POA: Diagnosis not present

## 2022-10-08 DIAGNOSIS — D0511 Intraductal carcinoma in situ of right breast: Secondary | ICD-10-CM

## 2022-10-08 HISTORY — DX: Malignant neoplasm of unspecified site of unspecified female breast: C50.919

## 2022-10-08 HISTORY — DX: Personal history of irradiation: Z92.3

## 2022-10-23 NOTE — Progress Notes (Signed)
Patient Care Team: Bess Kinds, MD as PCP - General (Family Medicine) Serena Croissant, MD as Consulting Physician (Hematology and Oncology) Lonie Peak, MD as Attending Physician (Radiation Oncology)  DIAGNOSIS:  Encounter Diagnosis  Name Primary?   Ductal carcinoma in situ (DCIS) of right breast Yes    SUMMARY OF ONCOLOGIC HISTORY: Oncology History  Ductal carcinoma in situ (DCIS) of right breast  08/09/2021 Initial Diagnosis   Screening mammogram detected bilateral breast abnormalities Left breast biopsy: Intraductal papilloma with UDH and calcifications Right breast: Intraductal papilloma with UDH and calcifications and DCIS intermediate grade ER 100%, PR 90%    Surgery   Bilateral lumpectomies: Bilateral DCIS, right breast inferior margin positive, repeat surgery for margins: Additional DCIS was found in the additional margin.  Final margins are clear   10/27/2021 - 11/27/2021 Radiation Therapy   Site Technique Total Dose (Gy) Dose per Fx (Gy) Completed Fx Beam Energies  Breast, Left: Breast_L 3D 40.05/40.05 2.67 15/15 6X  Breast, Left: Breast_L_Bst specialPort 10/10 2 5/5 9E, 12E  Breast, Right: Breast_R 3D 40.05/40.05 2.67 15/15 6X  Breast, Right: Breast_R_Bst 3D 10/10 2 5/5 6X, 10X     12/2021 -  Anti-estrogen oral therapy   Tamoxifen 20 mg once daily x5 years    04/04/2022 Cancer Staging   Staging form: Breast, AJCC 8th Edition - Clinical: Stage 0 (cTis (DCIS), cN0, cM0) - Signed by Loa Socks, NP on 04/04/2022     CHIEF COMPLIANT: Follow-up tamoxifen  INTERVAL HISTORY: Joy [name] is a 62 y.o. female is here because of recent diagnosis of bilateral DCIS.  She presents to the clinic for a follow-up.  She is tolerating tamoxifen extremely well without any problems or concerns.  She has mild joint aches and pains.  Denies any hot flashes arthralgias myalgias.  Denies any lumps or nodules in the breast.   ALLERGIES:  has No Known  Allergies.  MEDICATIONS:  Current Outpatient Medications  Medication Sig Dispense Refill   acetaZOLAMIDE (DIAMOX) 250 MG tablet TAKE 1 TABLET BY MOUTH EVERY DAY 90 tablet 1   atorvastatin (LIPITOR) 20 MG tablet Take 1 tablet (20 mg total) by mouth daily. 90 tablet 3   HYDROcodone-acetaminophen (NORCO/VICODIN) 5-325 MG tablet Take 1 tablet by mouth every 6 (six) hours as needed.     Multiple Vitamin (MULTIVITAMIN WITH MINERALS) TABS tablet Take 1 tablet by mouth daily.     spironolactone (ALDACTONE) 25 MG tablet TAKE 1 TABLET (25 MG TOTAL) BY MOUTH DAILY. 90 tablet 1   tamoxifen (NOLVADEX) 20 MG tablet Take 1 tablet (20 mg total) by mouth daily. 90 tablet 3   torsemide (DEMADEX) 20 MG tablet TAKE 1 TABLET BY MOUTH EVERY DAY 90 tablet 1   umeclidinium-vilanterol (ANORO ELLIPTA) 62.5-25 MCG/ACT AEPB Inhale 1 puff into the lungs daily. 60 each 11   varenicline (CHANTIX) 1 MG tablet Take 1 tablet (1 mg total) by mouth 2 (two) times daily. 56 tablet 0   VENTOLIN HFA 108 (90 Base) MCG/ACT inhaler INHALE 2 PUFFS INTO THE LUNGS EVERY 4 HOURS AS NEEDED FOR WHEEZING OR SHORTNESS OF BREATH. 18 each 3   No current facility-administered medications for this visit.    PHYSICAL EXAMINATION: ECOG PERFORMANCE STATUS: 1 - Symptomatic but completely ambulatory  Vitals:   10/26/22 0938  BP: 108/69  Pulse: 64  Resp: 18  Temp: (!) 97.3 F (36.3 C)  SpO2: 100%   Filed Weights   10/26/22 0938  Weight: 145 lb 11.2 oz (66.1 kg)  BREAST: No palpable masses or nodules in either right or left breasts. No palpable axillary supraclavicular or infraclavicular adenopathy no breast tenderness or nipple discharge. (exam performed in the presence of a chaperone)  LABORATORY DATA:  I have reviewed the data as listed    Latest Ref Rng & Units 04/26/2021   11:11 AM 09/16/2019    3:36 PM 09/16/2019    3:26 PM  CMP  Glucose 70 - 99 mg/dL 90     BUN 8 - 27 mg/dL 13     Creatinine 1.61 - 1.00 mg/dL 0.96      Sodium 045 - 144 mmol/L 139  138  139   Potassium 3.5 - 5.2 mmol/L 4.3  4.4  4.1   Chloride 96 - 106 mmol/L 92     CO2 20 - 29 mmol/L 30     Calcium 8.7 - 10.3 mg/dL 40.9       Lab Results  Component Value Date   WBC 7.0 09/09/2019   HGB 13.9 09/16/2019   HCT 41.0 09/16/2019   MCV 95.2 09/09/2019   PLT 254 09/09/2019   NEUTROABS 2.4 04/30/2019    ASSESSMENT & PLAN:  Ductal carcinoma in situ (DCIS) of right breast 08/09/2021: Screening mammogram detected bilateral breast abnormalities.  She underwent bilateral breast biopsies and subsequently went to [facility] where she underwent bilateral lumpectomies.  Right breast inferior margin was positive so she had to go back in for additional surgery and the final margins are negative.  DCIS was grade 2, ER 100%, PR 90% Adjuvant radiation: 10/27/2021-11/27/2021   Treatment plan: Tamoxifen 20 mg once daily x5 years started 12/13/2021 Tamoxifen toxicities: Mild joint aches and pains  Breast cancer surveillance: Breast exam 10/26/2022: Benign Mammogram 10/08/2022: Benign breast density category C  Return to clinic in 1 year for follow-up    No orders of the defined types were placed in this encounter.  The patient has a good understanding of the overall plan. she agrees with it. she will call with any problems that may develop before the next visit here. Total time spent: 30 mins including face to face time and time spent for planning, charting and co-ordination of care   Tamsen Meek, MD 10/26/22    I Janan Ridge am acting as a Neurosurgeon for [employer]  I have reviewed the above documentation for accuracy and completeness, and I agree with the above.

## 2022-10-26 ENCOUNTER — Inpatient Hospital Stay: Payer: Medicaid Other | Attending: Hematology and Oncology | Admitting: Hematology and Oncology

## 2022-10-26 ENCOUNTER — Other Ambulatory Visit: Payer: Self-pay

## 2022-10-26 VITALS — BP 108/69 | HR 64 | Temp 97.3°F | Resp 18 | Ht 66.5 in | Wt 145.7 lb

## 2022-10-26 DIAGNOSIS — D0511 Intraductal carcinoma in situ of right breast: Secondary | ICD-10-CM | POA: Diagnosis not present

## 2022-10-26 DIAGNOSIS — Z923 Personal history of irradiation: Secondary | ICD-10-CM | POA: Insufficient documentation

## 2022-10-26 DIAGNOSIS — Z79810 Long term (current) use of selective estrogen receptor modulators (SERMs): Secondary | ICD-10-CM | POA: Insufficient documentation

## 2022-10-26 MED ORDER — TAMOXIFEN CITRATE 20 MG PO TABS: 20.0000 mg | ORAL_TABLET | Freq: Every day | ORAL | 3 refills | Status: DC

## 2022-10-26 NOTE — Assessment & Plan Note (Signed)
08/09/2021: Screening mammogram detected bilateral breast abnormalities.  She underwent bilateral breast biopsies and subsequently went to [facility] where she underwent bilateral lumpectomies.  Right breast inferior margin was positive so she had to go back in for additional surgery and the final margins are negative.  DCIS was grade 2, ER 100%, PR 90% Adjuvant radiation: 10/27/2021-11/27/2021   Treatment plan: Tamoxifen 20 mg once daily x5 years started 12/13/2021 Tamoxifen toxicities:  Breast cancer surveillance: Breast exam 10/26/2022: Benign Mammogram 10/08/2022: Benign breast density category C  Return to clinic in 1 year for follow-up

## 2022-11-16 ENCOUNTER — Telehealth: Payer: Self-pay | Admitting: Pulmonary Disease

## 2022-11-16 NOTE — Telephone Encounter (Signed)
Pt calling to get Anoro and albuterol refilled. Would like to have this sent to [facility]. Please advise . Has not been seen in a year but made appt today

## 2022-11-21 MED ORDER — UMECLIDINIUM-VILANTEROL 62.5-25 MCG/ACT IN AEPB: 1.0000 | INHALATION_SPRAY | Freq: Every day | RESPIRATORY_TRACT | 1 refills | Status: DC

## 2022-11-21 MED ORDER — ALBUTEROL SULFATE HFA 108 (90 BASE) MCG/ACT IN AERS: 2.0000 | INHALATION_SPRAY | RESPIRATORY_TRACT | 0 refills | Status: DC | PRN

## 2022-11-21 NOTE — Telephone Encounter (Signed)
Refill sent.

## 2022-12-31 ENCOUNTER — Other Ambulatory Visit: Payer: Self-pay | Admitting: Pulmonary Disease

## 2023-01-07 ENCOUNTER — Encounter: Payer: Self-pay | Admitting: Pulmonary Disease

## 2023-01-07 ENCOUNTER — Ambulatory Visit (INDEPENDENT_AMBULATORY_CARE_PROVIDER_SITE_OTHER): Payer: Medicaid Other | Admitting: Pulmonary Disease

## 2023-01-07 VITALS — BP 96/68 | HR 76 | Temp 98.4°F | Ht 66.0 in | Wt 149.0 lb

## 2023-01-07 DIAGNOSIS — I272 Pulmonary hypertension, unspecified: Secondary | ICD-10-CM | POA: Diagnosis not present

## 2023-01-07 DIAGNOSIS — J439 Emphysema, unspecified: Secondary | ICD-10-CM | POA: Diagnosis not present

## 2023-01-07 DIAGNOSIS — F17210 Nicotine dependence, cigarettes, uncomplicated: Secondary | ICD-10-CM | POA: Diagnosis not present

## 2023-01-07 MED ORDER — UMECLIDINIUM-VILANTEROL 62.5-25 MCG/ACT IN AEPB: 1.0000 | INHALATION_SPRAY | Freq: Every day | RESPIRATORY_TRACT | 11 refills | Status: DC

## 2023-01-07 MED ORDER — ALBUTEROL SULFATE HFA 108 (90 BASE) MCG/ACT IN AERS: 2.0000 | INHALATION_SPRAY | Freq: Four times a day (QID) | RESPIRATORY_TRACT | 11 refills | Status: DC | PRN

## 2023-01-07 NOTE — Patient Instructions (Addendum)
Nice to see you again  Continue Anoro 1 puff once a day  Continue albuterol 2 puffs every 4-6 hour AS NEEDED  Return to clinic in 1 year or sooner as needed

## 2023-01-07 NOTE — Progress Notes (Signed)
Synopsis: Referred in April 2021 for COPD by Bess Kinds, MD.  Subjective:   PATIENT ID: Joy [name] GENDER: female DOB: 1961-02-25, MRN: [id number]  Chief Complaint  Patient presents with   Follow-up    Some cough persistent    Joy [name] is a 62 y.o. woman with a history of COPD, pulmonary hypertension, chronic hypoxic respiratory failure who presents for follow-up.  She continues on Anoro once daily. She discontinued smoking in February 2021 after previously smoking 0.5 packs/day, but now smoking again. She uses nocturnal O2, 2L [place]. No desaturation when walked in office previously. Has declined enrollment in lung cancer screening.   Doing well. No issues with DOE. Uses Anoro daily. Feels beneficial.  Use albuterol about once every other day. Discussed again role and rational for lung cancer screening. She declines this intervention.  Lost to follow-up last seen about 18 months ago.  In the interim has undergone definitive therapy for breast cancer.  Doing well from that standpoint.    Past Medical History:  Diagnosis Date   Acute congestive heart failure (HCC)    Acute dyspnea    Acute on chronic diastolic CHF (congestive heart failure) (HCC) 08/11/2015   Acute right heart failure (HCC) 08/11/2015   Bilateral lower extremity edema    Breast cancer (HCC)    CHF (congestive heart failure) (HCC)    COPD (chronic obstructive pulmonary disease) (HCC) 08/10/2015   Dx on XRAY 08/08/15.  Needs PFTs    GASTROESOPHAGEAL REFLUX, NO ESOPHAGITIS 05/16/2006   Qualifier: Diagnosis of  By: Haydee Salter     GERD (gastroesophageal reflux disease)    Hypertension    Hypervolemia    Leg swelling hospitalized 08/08/2015   BLE   PELVIC MASS 07/05/2008   Qualifier: Diagnosis of  By: McDiarmid MD, Tawanna Cooler     Personal history of radiation therapy    Pulmonary hypertension (HCC)    Respiratory failure (HCC) 05/03/2019   Right heart failure (HCC) 08/10/2015   Tobacco abuse 05/16/2006    Qualifier: Diagnosis of  By: Haydee Salter       Family History  Problem Relation Age of Onset   Aneurysm Mother    Hypertension Brother    Prostate cancer Brother      Past Surgical History:  Procedure Laterality Date   BREAST BIOPSY     BREAST LUMPECTOMY     RIGHT HEART CATH N/A 09/16/2019   Procedure: RIGHT HEART CATH;  Surgeon: Dolores Patty, MD;  Location: [facility];  Service: Cardiovascular;  Laterality: N/A;   RIGHT/LEFT HEART CATH AND CORONARY ANGIOGRAPHY N/A 12/26/2018   Procedure: RIGHT/LEFT HEART CATH AND CORONARY ANGIOGRAPHY;  Surgeon: Dolores Patty, MD;  Location: [facility];  Service: Cardiovascular;  Laterality: N/A;   TUBAL LIGATION      Social History   Socioeconomic History   Marital status: Married    Spouse name: Not on file   Number of children: Not on file   Years of education: Not on file   Highest education level: Not on file  Occupational History   Not on file  Tobacco Use   Smoking status: Every Day    Current packs/day: 0.00    Average packs/day: 0.5 packs/day for 36.0 years (18.0 ttl pk-yrs)    Types: Cigarettes    Start date: 04/20/1983    Last attempt to quit: 04/20/2019    Years since quitting: 3.7   Smokeless tobacco: Former    Types: Sports administrator  Tobacco comments:    "quit chewing in my early '20s"  Vaping Use   Vaping status: Never Used  Substance and Sexual Activity   Alcohol use: Yes    Alcohol/week: 23.0 standard drinks of alcohol    Types: 12 Cans of beer, 11 Shots of liquor per week    Comment: occasional   Drug use: Yes    Types: Marijuana    Comment: 08/08/2015 "I smoke marijuana when I have it; probably a couple times/week"   Sexual activity: Yes  Other Topics Concern   Not on file  Social History Narrative   Not on file   Social Determinants of Health   Financial Resource Strain: Low Risk  (04/20/2022)   Overall Financial Resource Strain (CARDIA)    Difficulty of Paying Living Expenses: Not  very hard  Food Insecurity: No Food Insecurity (03/05/2022)   Hunger Vital Sign    Worried About Running Out of Food in the Last Year: Never true    Ran Out of Food in the Last Year: Never true  Transportation Needs: Unmet Transportation Needs (10/19/2021)   PRAPARE - Administrator, Civil Service (Medical): Yes    Lack of Transportation (Non-Medical): Not on file  Physical Activity: Inactive (11/28/2021)   Exercise Vital Sign    Days of Exercise per Week: 0 days    Minutes of Exercise per Session: 0 min  Stress: No Stress Concern Present (04/20/2022)   [employer] of Occupational Health - Occupational Stress Questionnaire    Feeling of Stress : Only a little  Social Connections: Moderately Isolated (01/31/2022)   Social Connection and Isolation Panel [NHANES]    Frequency of Communication with Friends and Family: More than three times a week    Frequency of Social Gatherings with Friends and Family: More than three times a week    Attends Religious Services: Never    Database administrator or Organizations: No    Attends Banker Meetings: Never    Marital Status: Married  Catering manager Violence: Not At Risk (01/01/2022)   Humiliation, Afraid, Rape, and Kick questionnaire    Fear of Current or Ex-Partner: No    Emotionally Abused: No    Physically Abused: No    Sexually Abused: No     No Known Allergies   Immunization History  Administered Date(s) Administered   Influenza,inj,Quad PF,6+ Mos 03/06/2018, 12/27/2018, 04/26/2021   PFIZER(Purple Top)SARS-COV-2 Vaccination 06/04/2019, 06/25/2019   PPD Test 11/04/2015   Pfizer Covid-19 Vaccine Bivalent Booster 13yrs & up 04/26/2021   Pneumococcal Conjugate-13 09/29/2019   Pneumococcal Polysaccharide-23 08/09/2015, 03/06/2018   Td 01/17/2005   Tdap 04/26/2021    Outpatient Medications Prior to Visit  Medication Sig Dispense Refill   acetaZOLAMIDE (DIAMOX) 250 MG tablet TAKE 1 TABLET BY MOUTH EVERY  DAY 90 tablet 1   Multiple Vitamin (MULTIVITAMIN WITH MINERALS) TABS tablet Take 1 tablet by mouth daily.     spironolactone (ALDACTONE) 25 MG tablet TAKE 1 TABLET (25 MG TOTAL) BY MOUTH DAILY. 90 tablet 1   tamoxifen (NOLVADEX) 20 MG tablet Take 1 tablet (20 mg total) by mouth daily. 90 tablet 3   torsemide (DEMADEX) 20 MG tablet TAKE 1 TABLET BY MOUTH EVERY DAY 90 tablet 1   umeclidinium-vilanterol (ANORO ELLIPTA) 62.5-25 MCG/ACT AEPB Inhale 1 puff into the lungs daily. 60 each 1   VENTOLIN HFA 108 (90 Base) MCG/ACT inhaler INHALE 2 PUFFS INTO THE LUNGS EVERY 4 HOURS AS NEEDED FOR WHEEZING OR  SHORTNESS OF BREATH. 18 each 0   atorvastatin (LIPITOR) 20 MG tablet Take 1 tablet (20 mg total) by mouth daily. (Patient not taking: Reported on 01/07/2023) 90 tablet 3   HYDROcodone-acetaminophen (NORCO/VICODIN) 5-325 MG tablet Take 1 tablet by mouth every 6 (six) hours as needed. (Patient not taking: Reported on 01/07/2023)     varenicline (CHANTIX) 1 MG tablet Take 1 tablet (1 mg total) by mouth 2 (two) times daily. (Patient not taking: Reported on 01/07/2023) 56 tablet 0   No facility-administered medications prior to visit.      Objective:   Vitals:   01/07/23 1544  BP: 96/68  Pulse: 76  Temp: 98.4 F (36.9 C)  TempSrc: Oral  SpO2: 95%  Weight: 149 lb (67.6 kg)  Height: 5\' 6"  (1.676 m)   95% on   RA BMI Readings from Last 3 Encounters:  01/07/23 24.05 kg/m  10/26/22 23.16 kg/m  04/04/22 23.96 kg/m   Wt Readings from Last 3 Encounters:  01/07/23 149 lb (67.6 kg)  10/26/22 145 lb 11.2 oz (66.1 kg)  04/04/22 150 lb 11.2 oz (68.4 kg)    Physical Exam Vitals reviewed.  Constitutional:      General: She is not in acute distress.    Appearance: She is not ill-appearing.  HENT:     Head: Normocephalic and atraumatic.  Eyes:     General: No scleral icterus. Cardiovascular:     Rate and Rhythm: Normal rate and regular rhythm.     Heart sounds: No murmur heard. Pulmonary:      Comments: CTAB, no conversational dyspnea Abdominal:     General: There is no distension.     Palpations: Abdomen is soft.     Tenderness: There is no abdominal tenderness.  Musculoskeletal:        General: No swelling or deformity.     Cervical back: Neck supple.  Lymphadenopathy:     Cervical: No cervical adenopathy.  Skin:    General: Skin is warm and dry.     Findings: No rash.  Neurological:     General: No focal deficit present.     Mental Status: She is alert.     Coordination: Coordination normal.  Psychiatric:        Mood and Affect: Mood normal.        Behavior: Behavior normal.      CBC    Component Value Date/Time   WBC 7.0 09/09/2019 1132   RBC 4.39 09/09/2019 1132   HGB 13.9 09/16/2019 1536   HCT 41.0 09/16/2019 1536   HCT 54.3 (H) 05/02/2019 0546   PLT 254 09/09/2019 1132   MCV 95.2 09/09/2019 1132   MCH 29.2 09/09/2019 1132   MCHC 30.6 09/09/2019 1132   RDW 13.8 09/09/2019 1132   LYMPHSABS 1.7 04/30/2019 1022   MONOABS 0.6 04/30/2019 1022   EOSABS 0.0 04/30/2019 1022   BASOSABS 0.1 04/30/2019 1022    CHEMISTRY No results for input(s): "NA", "K", "CL", "CO2", "GLUCOSE", "BUN", "CREATININE", "CALCIUM", "MG", "PHOS" in the last 168 hours. CrCl cannot be calculated (Patient's most recent lab result is older than the maximum 21 days allowed.).   Chest Imaging- films reviewed: CXR, 2 view 07/03/2019-interpreted as clear lungs, hyperinflated.  CTA chest 08/10/2015-is a system, no significant emphysema.  Very large pulmonary artery.  No PE.  VQ scan- low probability for PE; asymmetric but matched perfusion and ventilation abnormalities  Pulmonary Functions Testing Results:    Latest Ref Rng & Units 07/27/2019  2:49 PM  PFT Results  FVC-Pre L 2.03   FVC-Predicted Pre % 71   FVC-Post L 1.79   FVC-Predicted Post % 63   Pre FEV1/FVC % % 53   Post FEV1/FCV % % 52   FEV1-Pre L 1.07   FEV1-Predicted Pre % 48   FEV1-Post L 0.94    2021- severe  obstruction, no significant BD reversibility. Test performed with difficulty.  Echocardiogram 05/02/2019: LVEF 60 to 65% with regional wall motion abnormalities.  Unable to evaluate diastolic function.  Enlarged RV and RA with atrial septal bowing and ventricular septal flattening.  Moderately reduced RV systolic function.  Trivial MR.  Dilated IVC with limited respiratory variability.  Heart Catheterization 12/26/2018:  LHC/RHC 12/26/2018 Ao = 105/66 (85) LV =  106/12 RA =  9 RV = 75/10 PA = 73/24 (45) PCW = 14 Fick cardiac output/index = 3.8/2.1 PVR = 8.0 WU Ao sat = 83% (RA - no sedation) PA sat = 54%, 56%   Arterial sats 78-83% on ABG (sats read 85-87% on pulse ox at the time)   ABG in cath lab (RA): 7.45/71/49/83% - no sedation     RHC 09/16/19: RA = 2 RV = 56/6 PA = 55/20 (33) PCW = 9 Fick cardiac output/index = 5.6/3.1 PVR = 4.0 WU Ao sat = 95% PA sat = 73%, 77% High SVC sat = 70% Assessment: 1. Moderate PAH  2. Normal left-sided pressures with normal output 3. No evidence of intra-cardiac shunting.  Plan/Discussion: PA pressures improved since previous. Continue medical therapy.   Assessment & Plan:   No diagnosis found.   COPD- GOLD C.  Symptoms minimal.  Unfortunate has resumed smoking. -Continue Anoro once daily.  Refilled today. -Continue albuterol every 4 hours as needed as her rescue inhaler.  Refilled today. Smoking assessment and cessation counseling I have advised the patient to quit/stop smoking as soon as possible due to high risk for multiple medical problems.  It will also be very difficult for [place] to manage patient's  respiratory symptoms and status if we continue to expose her lungs to a known irritant.  Patient is not willing to quit smoking. I have advised the patient that we can assist and have options of nicotine replacement therapy, provided smoking cessation education today, provided smoking cessation counseling, and provided cessation  resources. Follow-up next office visit office visit for assessment of smoking cessation.  I spent 3 minutes in tobacco cessation counseling.  Chronic hypoxic respiratory failure requiring nocturnal oxygen -Continue 2 L supplemental oxygen at night for sleeping  Pulmonary hypertension- WHO group III. She is currently very well compensated based on symptoms and had significant improvement in her pulmonary hemodynamics since beginning treatment with nocturnal O2.  No evidence of CTEPH on VQ. No OSA.  -Not a candidate for pulmonary vasodilators. -Continue supplemental oxygen at night. -Continue Anoro once daily  Lung Cancer screening: Discussed the risk and benefits of lung cancer screening with yearly low dose CT scan. She has 36+ pack year history, quit February 2021, but has since resumed smoking. Meets criteria for screening. Used decision aid "[email or web address]." After discussion she declines lung cancer screening.   RTC in 12 months.   Karren Burly, MD [facility] 01/07/2023 3:49 PM

## 2023-01-14 ENCOUNTER — Other Ambulatory Visit: Payer: Self-pay | Admitting: Student

## 2023-01-16 ENCOUNTER — Telehealth: Payer: Self-pay

## 2023-01-16 NOTE — Telephone Encounter (Signed)
..  Medicaid Managed Care   Unsuccessful Outreach Note  01/16/2023 Name: Joy [name] MRN: [id number] DOB: 11/19/1960  Referred by: Bess Kinds, MD Reason for referral : Appointment (I called the patient today to get her scheduled for a phone apt with the MM BSW. I left a message on her VM.)   An unsuccessful telephone outreach was attempted today. The patient was referred to the case management team for assistance with care management and care coordination.   Follow Up Plan: A HIPAA compliant phone message was left for the patient providing contact information and requesting a return call.  The care management team will reach out to the patient again over the next 7 days.   Weston Settle Care Guide  [facility] Managed  Care Guide [facility]  [phone]

## 2023-02-04 ENCOUNTER — Other Ambulatory Visit: Payer: Self-pay

## 2023-02-04 NOTE — Patient Instructions (Signed)
Visit Information  Ms. Coleson was given information about Medicaid Managed Care team care coordination services as a part of their [facility] Community Plan Medicaid benefit. [name] verbally consented to engagement with the [facility] Managed Care team.   If you are experiencing a medical emergency, please call 911 or report to your local emergency department or urgent care.   If you have a non-emergency medical problem during routine business hours, please contact your provider's office and ask to speak with a nurse.   For questions related to your [facility], please call: [phone] or visit the homepage here: [email or web address]  If you would like to schedule transportation through your [facility], please call the following number at least 2 days in advance of your appointment: [phone]   Rides for urgent appointments can also be made after hours by calling Member Services.  Call the Behavioral Health Crisis Line at [phone], at any time, 24 hours a day, 7 days a week. If you are in danger or need immediate medical attention call 911.  If you would like help to quit smoking, call 1-800-QUIT-NOW (513-247-1714) OR Espaol: 1-855-Djelo-Ya (3-474-259-5638) o para ms informacin haga clic aqu or Text READY to 756-433 to register via text  Ms. Jemison - following are the goals we discussed in your visit today:   Goals Addressed   None      Social Worker will follow up in 30 days.   Gus Puma, Kenard Gower, MHA [facility]  Managed Medicaid Social Worker [phone]   Following is a copy of your plan of care:  There are no care plans that you recently modified to display for this patient.

## 2023-02-04 NOTE — Patient Outreach (Signed)
Medicaid Managed Care Social Work Note  02/04/2023 Name:  Joy [name] MRN:  [id number] DOB:  [birth year]  Joy [name] is an 62 y.o. year old female who is a primary patient of Sowell, Apolinar Junes, MD.  The Medicaid Managed Care Coordination team was consulted for assistance with:  [facility]   Ms. Belles was given information about Medicaid Managed Care Coordination team services today. Joy [name] Patient agreed to services and verbal consent obtained.  Engaged with patient  for by telephone forinitial visit in response to referral for case management and/or care coordination services.   Assessments/Interventions:  Review of past medical history, allergies, medications, health status, including review of consultants reports, laboratory and other test data, was performed as part of comprehensive evaluation and provision of chronic care management services.  SDOH: (Social Determinant of Health) assessments and interventions performed: SDOH Interventions    Flowsheet Row Patient Outreach Telephone from 04/20/2022 in [place] POPULATION HEALTH DEPARTMENT Patient Outreach Telephone from 03/05/2022 in [place] POPULATION HEALTH DEPARTMENT Patient Outreach Telephone from 01/31/2022 in [place] POPULATION HEALTH DEPARTMENT Patient Outreach Telephone from 01/01/2022 in [facility] Community Care Coordination Patient Outreach Telephone from 11/28/2021 in [facility] Social Work from 10/19/2021 in [facility]  SDOH Interventions        Food Insecurity Interventions -- Intervention Not Indicated -- -- -- --  Housing Interventions -- -- Intervention Not Indicated -- -- --  Transportation Interventions -- -- -- -- -- Payor Benefit  Utilities Interventions -- -- -- -- Intervention Not Indicated --  Alcohol Usage Interventions -- -- -- Intervention Not Indicated (Score <7) -- --   Financial Strain Interventions Intervention Not Indicated -- -- -- -- --  Physical Activity Interventions -- -- -- -- Intervention Not Indicated, Other (Comments)  [patient just completed radiation treatments for 4 weeks] --  Stress Interventions Intervention Not Indicated -- -- -- -- --  Social Connections Interventions -- -- Intervention Not Indicated -- -- --     BSW completed a telephone outreach with patient, she states she would like utility resources for the winter months because her utility bill can get high. She does not have a cut off notice and does not need help right now. BSW and patient agreed for utility resources to be mailed. No other resources are needed at this time.  Advanced Directives Status:  Not addressed in this encounter.  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   Follow up:  Patient agrees to Care Plan and Follow-up.  Plan: The Managed Medicaid  care management team will reach out to the patient again over the next 30 days.  Date/time of next scheduled Social Work care management/care coordination outreach:  03/06/23 Gus Puma, Kenard Gower, [facility]  Managed [facility] Social Worker [phone]

## 2023-03-06 ENCOUNTER — Other Ambulatory Visit: Payer: Self-pay

## 2023-03-06 NOTE — Patient Instructions (Signed)
Visit Information  [name] was given information about Medicaid Managed Care team care coordination services as a part of their [facility] Community Plan Medicaid benefit. Joy [name] verbally consented to engagement with the [facility] Managed Care team.   If you are experiencing a medical emergency, please call 911 or report to your local emergency department or urgent care.   If you have a non-emergency medical problem during routine business hours, please contact your provider's office and ask to speak with a nurse.   For questions related to your [facility], please call: [phone] or visit the homepage here: [email or web address]  If you would like to schedule transportation through your [facility], please call the following number at least 2 days in advance of your appointment: [phone]   Rides for urgent appointments can also be made after hours by calling Member Services.  Call the Behavioral Health Crisis Line at [phone], at any time, 24 hours a day, 7 days a week. If you are in danger or need immediate medical attention call 911.  If you would like help to quit smoking, call 1-800-QUIT-NOW (415-187-8792) OR Espaol: 1-855-Djelo-Ya (6-644-034-7425) o para ms informacin haga clic aqu or Text READY to 956-387 to register via text  Joy [name] - following are the goals we discussed in your visit today:   Goals Addressed   None      The  Patient                                              has been provided with contact information for the Managed Medicaid care management team and has been advised to call with any health related questions or concerns.   Gus Puma, Kenard Gower, MHA [facility]  Managed Medicaid Social Worker [phone]   Following is a copy of your plan of care:  There are no care plans that you recently modified to display for this  patient.

## 2023-03-06 NOTE — Patient Outreach (Signed)
Medicaid Managed Care Social Work Note  03/06/2023 Name:  Joy [name] MRN:  [id number] DOB:  [birth year]  Joy [name] is an 62 y.o. year old female who is a primary patient of Sowell, Apolinar Junes, MD.  The Medicaid Managed Care Coordination team was consulted for assistance with:  [facility]   Ms. Tung was given information about Medicaid Managed Care Coordination team services today. Joy [name] Patient agreed to services and verbal consent obtained.  Engaged with patient  for by telephone forfollow up visit in response to referral for case management and/or care coordination services.   Patient is participating in a Managed Medicaid Plan:  Yes  Assessments/Interventions:  Review of past medical history, allergies, medications, health status, including review of consultants reports, laboratory and other test data, was performed as part of comprehensive evaluation and provision of chronic care management services.  SDOH: (Social Drivers of Health) assessments and interventions performed: SDOH Interventions    Flowsheet Row Patient Outreach Telephone from 04/20/2022 in [place] [facility] POPULATION HEALTH DEPARTMENT Patient Outreach Telephone from 03/05/2022 in [place] POPULATION HEALTH DEPARTMENT Patient Outreach Telephone from 01/31/2022 in [place] POPULATION HEALTH DEPARTMENT Patient Outreach Telephone from 01/01/2022 in [facility] Community Care Coordination Patient Outreach Telephone from 11/28/2021 in [facility] [employer] Social Work from 10/19/2021 in [facility] - [facility] [place]  SDOH Interventions        Food Insecurity Interventions -- Intervention Not Indicated -- -- -- --  Housing Interventions -- -- Intervention Not Indicated -- -- --  Transportation Interventions -- -- -- -- -- Payor Benefit  Utilities Interventions -- -- -- -- Intervention Not Indicated --  Alcohol Usage Interventions  -- -- -- Intervention Not Indicated (Score <7) -- --  Financial Strain Interventions Intervention Not Indicated -- -- -- -- --  Physical Activity Interventions -- -- -- -- Intervention Not Indicated, Other (Comments)  [patient just completed radiation treatments for 4 weeks] --  Stress Interventions Intervention Not Indicated -- -- -- -- --  Social Connections Interventions -- -- Intervention Not Indicated -- -- --      BSW completed a telephone outreach with patient. She states she received the resources BSW sent. No other resources are needed at this time. Advanced Directives Status:  Not addressed in this encounter.  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   Follow up:  Patient agrees to Care Plan and Follow-up.  Plan: The  Patient has been provided with contact information for the Managed Medicaid care management team and has been advised to call with any  health related questions or concerns.   Abelino Derrick, MHA [facility]  Managed Practice [facility] Social Worker [phone]

## 2023-04-18 ENCOUNTER — Other Ambulatory Visit: Payer: Self-pay | Admitting: Student

## 2023-08-15 ENCOUNTER — Other Ambulatory Visit: Payer: Self-pay | Admitting: Student

## 2023-08-27 ENCOUNTER — Encounter: Payer: Self-pay | Admitting: *Deleted

## 2023-10-18 ENCOUNTER — Other Ambulatory Visit: Payer: Self-pay | Admitting: Adult Health

## 2023-10-18 DIAGNOSIS — Z0000 Encounter for general adult medical examination without abnormal findings: Secondary | ICD-10-CM

## 2023-10-25 ENCOUNTER — Other Ambulatory Visit: Payer: Self-pay | Admitting: Adult Health

## 2023-10-25 DIAGNOSIS — Z9889 Other specified postprocedural states: Secondary | ICD-10-CM

## 2023-10-27 NOTE — Assessment & Plan Note (Deleted)
 08/09/2021: Screening mammogram detected bilateral breast abnormalities.  She underwent bilateral breast biopsies and subsequently went to [facility] where she underwent bilateral lumpectomies.  Right breast inferior margin was positive so she had to go back in for additional surgery and the final margins are negative.  DCIS was grade 2, ER 100%, PR 90% Adjuvant radiation: 10/27/2021-11/27/2021   Treatment plan: Tamoxifen  20 mg once daily x5 years started 12/13/2021 Tamoxifen  toxicities: Mild joint aches and pains   Breast cancer surveillance: Breast exam 10/28/2023: Benign Mammogram scheduled for 10/31/23   Return to clinic in 1 year for follow-up

## 2023-10-28 ENCOUNTER — Inpatient Hospital Stay: Payer: Medicaid Other | Attending: Hematology and Oncology | Admitting: Hematology and Oncology

## 2023-10-28 DIAGNOSIS — D0511 Intraductal carcinoma in situ of right breast: Secondary | ICD-10-CM

## 2023-10-31 ENCOUNTER — Ambulatory Visit
Admission: RE | Admit: 2023-10-31 | Discharge: 2023-10-31 | Disposition: A | Discharge status: Home / Self Care | Source: Ambulatory Visit | Attending: Adult Health | Admitting: Adult Health

## 2023-10-31 DIAGNOSIS — R928 Other abnormal and inconclusive findings on diagnostic imaging of breast: Secondary | ICD-10-CM | POA: Diagnosis not present

## 2023-10-31 DIAGNOSIS — Z9889 Other specified postprocedural states: Secondary | ICD-10-CM

## 2023-11-11 ENCOUNTER — Other Ambulatory Visit: Payer: Self-pay | Admitting: Hematology and Oncology

## 2023-11-12 ENCOUNTER — Other Ambulatory Visit: Payer: Self-pay | Admitting: *Deleted

## 2023-11-13 ENCOUNTER — Encounter: Payer: Self-pay | Admitting: Family Medicine

## 2023-11-13 ENCOUNTER — Ambulatory Visit (INDEPENDENT_AMBULATORY_CARE_PROVIDER_SITE_OTHER): Admitting: Family Medicine

## 2023-11-13 VITALS — BP 99/64 | HR 80 | Ht 66.0 in | Wt 142.8 lb

## 2023-11-13 DIAGNOSIS — Z122 Encounter for screening for malignant neoplasm of respiratory organs: Secondary | ICD-10-CM | POA: Diagnosis not present

## 2023-11-13 DIAGNOSIS — Z1211 Encounter for screening for malignant neoplasm of colon: Secondary | ICD-10-CM

## 2023-11-13 DIAGNOSIS — M25551 Pain in right hip: Secondary | ICD-10-CM

## 2023-11-13 DIAGNOSIS — M25511 Pain in right shoulder: Secondary | ICD-10-CM

## 2023-11-13 DIAGNOSIS — Z720 Tobacco use: Secondary | ICD-10-CM | POA: Diagnosis not present

## 2023-11-13 DIAGNOSIS — G5603 Carpal tunnel syndrome, bilateral upper limbs: Secondary | ICD-10-CM

## 2023-11-13 DIAGNOSIS — Z0000 Encounter for general adult medical examination without abnormal findings: Secondary | ICD-10-CM

## 2023-11-13 MED ORDER — TORSEMIDE 20 MG PO TABS: 20.0000 mg | ORAL_TABLET | Freq: Every day | ORAL | 1 refills | Status: AC

## 2023-11-13 NOTE — Patient Instructions (Addendum)
 Thank you for visiting clinic today and allowing us  to participate in your care!  Please try using lidocaine  patches over your painful joint areas for relief.  For your wrist, please try using a brace for carpal tunnel syndrome. Keep your wrist in a neutral, flat position as much as possible. You can purchase these at your local pharmacy.  We placed an order for your chest CT to screen for lung cancer. Someone will call to let you know when your appointment is.   We placed a referral for your colonoscopy. Someone will call to let you know when your appointment is.   Congrats on your choice to try quitting smoking! You are scheduled to see Dr Koval next week for further management. Call 1-800-QUIT-NOW  [phone]) for free resources. Future Appointments  Date Time Provider Department Center  11/22/2023 10:30 AM Koval, Peter G, RPH-CPP [facility]   Please go to your local pharmacy to get your Shingles vaccines.   Please call your oncologist to be seen soon for routine follow up.   Please schedule a clinic appointment as needed. Plan to be seen at least 1 time every year.   Reach out any time with any questions or concerns you may have - we are here for you!  Damien Cassis, MD [facility] [phone]        Call 1800-QUIT-NOW for help with stopping smoking.

## 2023-11-13 NOTE — Progress Notes (Unsigned)
    SUBJECTIVE:   CHIEF COMPLAINT / HPI:   Extremity pain -Reports 1 week of R shoulder hurting, R hip and leg hurting, R pinky tingling -No weakness  -No recent falls/injuries   Annual Physical  -Presenting today for annual physical. Concerns per above.  ->40 year smoking history, around 4 cigs per day now, interested in quitting   PERTINENT  PMH / PSH: COPD, pulmonary HTN, HF, DCIS s/p lumpectomies on tamoxifen   OBJECTIVE:   BP 99/64   Pulse 80   Ht 5' 6 (1.676 m)   Wt 142 lb 12.8 oz (64.8 kg)   SpO2 100%   BMI 23.05 kg/m   General: Well-appearing. Resting comfortably in room. CV: Normal S1/S2. No extra heart sounds. Warm and well-perfused. Pulm: Breathing comfortably on room air. CTAB. No increased WOB. Abd: Soft, non-tender, non-distended. MSK: Positive Tinnels test bilaterally. No tenderness to palpation of R shoulder. Normal ROM of upper extremities bilaterally. No tenderness to palpation of spinal processes or iliac crests bilaterally. No tenderness to palpation of bilateral hips.  Skin:  Warm, dry. No extremity swelling. Negative straight leg raise bilaterally.  Neuro: Alert. 5/5 strength of upper and lower extremities. Ambulates independently without issue.  CN3,4,6: PERRLA. EOMI CN5: Sensation intact BL CN7: Facial expressions symmetric CN8: Hearing intact BL CN9: palate symmetric  CN10: regular speech CN11: turns head against resistance CN12: tongue midline Psych: Pleasant and appropriate.    ASSESSMENT/PLAN:   Assessment & Plan Annual physical exam Patient stable overall. Med rec completed. UTD on mammogram and pap smear.  -Discussed and ordered colonoscopy  -Discussed and ordered chest CT  -Discussed Shingles vaccine at local pharmacy  -Advised patient to see her oncologist soon for yearly follow up  -BMP ordered and lab appt made  Right shoulder pain, unspecified chronicity Right hip pain Reassuring exam findings. Suspect element of OA vs joint  pain 2/2 tamoxifen .  -Discussed lidocaine  patches in painful areas  -May try Voltaren gel if lidocaine  patches dont work well  Bilateral carpal tunnel syndrome Symptoms and exam most concerning for carpal tunnel syndrome bilaterally. Discussed keeping wrist in neutral position as much as possible. Discussed wearing brace as needed.  Tobacco abuse Patient interested in quitting smoking. Pharmacy referral placed. Appt made with Dr Koval. Free resource provided.    RTC in 1 year for annual or sooner as needed.  Damien Cassis, MD [facility]

## 2023-11-13 NOTE — Telephone Encounter (Signed)
 Chart reviewed. Rx refilled. Requesting patient to be seen in clinic, last visit in 2023.

## 2023-11-14 DIAGNOSIS — G5603 Carpal tunnel syndrome, bilateral upper limbs: Secondary | ICD-10-CM | POA: Insufficient documentation

## 2023-11-14 NOTE — Assessment & Plan Note (Signed)
 Symptoms and exam most concerning for carpal tunnel syndrome bilaterally. Discussed keeping wrist in neutral position as much as possible. Discussed wearing brace as needed.

## 2023-11-14 NOTE — Assessment & Plan Note (Signed)
 Patient interested in quitting smoking. Pharmacy referral placed. Appt made with Dr Koval. Free resource provided.

## 2023-11-14 NOTE — Assessment & Plan Note (Signed)
 Patient stable overall. Med rec completed. UTD on mammogram and pap smear.  -Discussed and ordered colonoscopy  -Discussed and ordered chest CT  -Discussed Shingles vaccine at local pharmacy  -Advised patient to see her oncologist soon for yearly follow up  -BMP ordered and lab appt made

## 2023-11-19 NOTE — Addendum Note (Signed)
 Addended by: Gwendolyn Mclees L on: 11/19/2023 03:15 PM   Modules accepted: Orders

## 2023-11-21 ENCOUNTER — Ambulatory Visit (HOSPITAL_COMMUNITY)
Admission: RE | Admit: 2023-11-21 | Discharge: 2023-11-21 | Disposition: A | Discharge status: Home / Self Care | Source: Ambulatory Visit | Attending: Family Medicine | Admitting: Family Medicine

## 2023-11-21 DIAGNOSIS — Z122 Encounter for screening for malignant neoplasm of respiratory organs: Secondary | ICD-10-CM | POA: Diagnosis present

## 2023-11-22 ENCOUNTER — Encounter: Payer: Self-pay | Admitting: Pharmacist

## 2023-11-22 ENCOUNTER — Ambulatory Visit: Admitting: Pharmacist

## 2023-11-22 ENCOUNTER — Other Ambulatory Visit

## 2023-11-22 VITALS — BP 103/73 | HR 67 | Wt 141.0 lb

## 2023-11-22 DIAGNOSIS — Z0000 Encounter for general adult medical examination without abnormal findings: Secondary | ICD-10-CM

## 2023-11-22 DIAGNOSIS — Z720 Tobacco use: Secondary | ICD-10-CM | POA: Diagnosis not present

## 2023-11-22 NOTE — Assessment & Plan Note (Signed)
 Tobacco use disorder with mild nicotine  dependence of 40+ years duration in a patient who is fair candidate for success because of low amount of nicotine  intake    - Plan to quit by next week with a specific quit date of 11/25/23 -Follow up phone call planned on 11/26/23  -Follow in 2 weeks to evaluate progress and conduct PFT Written patient instructions provided. Patient verbalized understanding of treatment plan.  Total time in face to face counseling 34 minutes.

## 2023-11-22 NOTE — Progress Notes (Signed)
 Reviewed and agree with Dr Rennis plan.

## 2023-11-22 NOTE — Progress Notes (Signed)
 S:   Chief Complaint  Patient presents with   Medication Management    Tobacco cessation   63 y.o. female who presents for evaluation/assistance with tobacco dependence. Patient is in decent spirits.    Patient was referred and last seen by Primary Care Provider, Dr. Diona, on 11/13/23.  At last visit, reported willingness to quit and smoking 4 cigs a day on 11/13/23.   PMH is significant for COPD(2017), hypertension, HF, breast cancer.   Age when started using tobacco on a daily basis early 56s. Brand smoked [employer] gold. Number of cigarettes/day: 3 cigs a day.  Estimated nicotine  content per cigarette (mg) 1.  Estimated nicotine  intake per day 3 mg.   Denies waking to smoke/ smoking at night   Breathing issues on exertion  Fagerstrom Score Question Scoring Patient Score  How soon after waking do you smoke your first cigarette? <5 mins (3) 5-30 mins (2) 31-60 mins (1) >60 mins (0) 2  Do you find it difficult NOT to smoke in places where you shouldn't? Yes(1) No (0) 0  Which cigarette would you most hate to give up? First one in AM (1) Any other one (0) 1   How many cigarettes do you smoke/day? 10 or less (0) 11-20 (1) 21-30 (2) >30 (3) 0  Do you smoke more during the first few hours after waking? Yes (1) No (0) 1  Do you smoke if you are so ill you cannot get out of bed? Yes (1) No (0) 0    Total Score   3  Score interpretation: low-to-moderate 3-4  Most recent quit attempt 2017 after COPD diagnosis  Medications used in past cessation efforts include: varenicline  (reports not helping her and made her more sleepy)  Rates IMPORTANCE of quitting tobacco on 1-10 scale of 9. Rates CONFIDENCE of quitting tobacco on 1-10 scale of 10 quitting for at least a week by Halloween.  Most common triggers to use tobacco include:  Life stressors  Husband and Cousin are smokers around her. Cousin tells her she needs to quit since she's coughing a lot Motivation to quit:  Recent shortness of breath  Should quit smoking current amount of cigarettes (10) by Monday. Can't smoke in kitchen this weekend O: Clinical ASCVD: Yes  The 10-year ASCVD risk score (Arnett DK, et al., 2019) is: 9.4%   Values used to calculate the score:     Age: 26 years     Clincally relevant sex: Female     Is Non-Hispanic African American: Yes     Diabetic: No     Tobacco smoker: Yes     Systolic Blood Pressure: 103 mmHg     Is BP treated: Yes     HDL Cholesterol: 77 mg/dL     Total Cholesterol: 212 mg/dL  Vitals:   90/94/74 8987  BP: 103/73  Pulse: 67  SpO2: 96%    Review of Systems  Respiratory:  Positive for cough and shortness of breath.   All other systems reviewed and are negative.   Physical Exam Constitutional:      Appearance: Normal appearance.  Pulmonary:     Effort: Pulmonary effort is normal.  Neurological:     Mental Status: She is alert.  Psychiatric:        Mood and Affect: Mood normal.        Behavior: Behavior normal.        Thought Content: Thought content normal.        Judgment: Judgment  normal.     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 Patient verbalized understanding of treatment plan.  Total time in face to face counseling 34 minutes.    Follow-up:  Pharmacist 12/10/23  Patient seen with Fonda Blase, PharmD Candidate - PY3 student and Estefana Blase, PharmD Candidate - PY4 student.

## 2023-11-22 NOTE — Patient Instructions (Signed)
 Nice to see you today!   Medication Changes: - Continue all other medication the same.   Tobacco Patient Instructions  Quitting smoking is one of the most important decisions you can make for your current and future health. Consider what you dislike about smoking and how quitting could personally benefit you. Try to cut down.   Aim to completely quit by Monday!  My target quit date is: 11/25/23  Starting today, Be a Quitter!  Remind yourself why you want to quit.  Delay your first cigarette of the day for as long as possible.  Start cleaning out all pockets, drawers, and your car of cigarettes.  Getting Through the Cravings Once You Are Smoke Free: Each craving will last about 10 minutes, whether or not you smoke. Here's how to get through the cravings without cigarettes:  DELAY: Tell yourself that you'll wait for the next craving. Do it every time! DEEP BREATHS: One reason smoking feels good is because you breathe in deeply to inhale. Take four slow, deep breaths and feel the relaxation without the hamful effects of cigarettes. DRINK WATER: Drink a glass of cool water. It will give your hands and mouth something to do and will help flush the nicotine  out of your system faster. DIVERT: Do something else -- brush your teeth, take a walk, call a friend who can offer you support. Just moving onto something other than thinking about cigarettes will move you through the craving.   Frequently Asked Questions  What can I do when I get the urge to smoke? To get through the urge to smoke, try the following:  Review your reasons for quitting and think of all the benefits to your health, your finances, and your family.  Remind yourself that there is no such thing as just one cigarette -- or even one puff.  Ride out the desire to smoke. Use the 4 Os -- Delay, Deep Breaths, Drink Water and Divert to get you through. The craving will go away eventually. Do not fool yourself into thinking you can  have just one cigarette.  Any tips on how to deal with stress? Stress is a natural part of life. The key is to deal with it without reaching for a cigarette. Taking deep breaths, counting backwards from 10 and asking yourself 1-how big a deal is this?"  Writing down your feelings, talking with a friend and doing things like positive self-talk and meditation are some other ways that people deal with daily stress.  What if I start smoking again? Slips happen. Most people try to quit smoking a few times before they are successful. Don't beat yourself up if this happens to you! Ask yourself if this was a slip or a relapse. A slip is a one-time mistake that is quickly corrected. A relapse is going back to your old smoking habits.   If you slip, don't give up. Think of it as a learning experience. Ask yourself what went wrong and renew your commitment to staying away from smoking for good.  If you relapse, try not to get discouraged. Ask yourself the question "What caused me to start smoking?" Figure out what helped you and what didn't when you tried to quit. Knowing why you relapsed is useful information for your next attempt to quit.  Please bring all medications to your clinic visits.  Please arrive 10-15 minutes prior to your scheduled visit time.

## 2023-11-23 ENCOUNTER — Ambulatory Visit: Payer: Self-pay | Admitting: Family Medicine

## 2023-11-23 LAB — BASIC METABOLIC PANEL WITH GFR
BUN/Creatinine Ratio: 7 — ABNORMAL LOW (ref 12–28)
BUN: 7 mg/dL — ABNORMAL LOW (ref 8–27)
CO2: 26 mmol/L (ref 20–29)
Calcium: 9.6 mg/dL (ref 8.7–10.3)
Chloride: 95 mmol/L — ABNORMAL LOW (ref 96–106)
Creatinine, Ser: 0.95 mg/dL (ref 0.57–1.00)
Glucose: 100 mg/dL — ABNORMAL HIGH (ref 70–99)
Potassium: 3.8 mmol/L (ref 3.5–5.2)
Sodium: 136 mmol/L (ref 134–144)
eGFR: 67 mL/min/1.73 (ref >59)

## 2023-11-26 ENCOUNTER — Telehealth: Payer: Self-pay | Admitting: Pharmacist

## 2023-11-26 DIAGNOSIS — Z720 Tobacco use: Secondary | ICD-10-CM

## 2023-11-26 NOTE — Assessment & Plan Note (Signed)
 Since last contact patient reports complete cessation!  Medications currently being used; None  Continues to rates IMPORTANCE of quitting tobacco as high.  Continues to rate CONFIDENCE of quitting tobacco as high.

## 2023-11-26 NOTE — Telephone Encounter (Addendum)
 Patient contacted for follow/up of tobacco intake reduction / cessation attempt.   Left HIPAA compliant voice mail requesting call back to direct phone: [phone]  Total time with patient call and documentation of interaction: 3 minutes.  Follow-up phone call planned: later in PM     Patient contacted for follow/up of tobacco essation attempt.   Since last contact patient reports complete cessation!  Medications currently being used; None  Continues to rates IMPORTANCE of quitting tobacco as high.  Continues to rate CONFIDENCE of quitting tobacco as high.  Most common triggers to use tobacco include; meals and first AM   Congratulated on success with quitting!  Total time with patient call and documentation of interaction: 11 minutes. Follow-up phone call planned: 2-3 weeks

## 2023-11-27 NOTE — Telephone Encounter (Signed)
 Reviewed and agree with Dr Rennis plan.

## 2023-12-10 ENCOUNTER — Ambulatory Visit (INDEPENDENT_AMBULATORY_CARE_PROVIDER_SITE_OTHER): Admitting: Pharmacist

## 2023-12-10 ENCOUNTER — Encounter: Payer: Self-pay | Admitting: Pharmacist

## 2023-12-10 VITALS — BP 99/61 | HR 66 | Ht 65.16 in | Wt 146.4 lb

## 2023-12-10 DIAGNOSIS — J439 Emphysema, unspecified: Secondary | ICD-10-CM

## 2023-12-10 NOTE — Assessment & Plan Note (Addendum)
 Patient has been experiencing shortness of breath and coughing with COPD status for years with chronic tobacco use, with recent quit as of 11/25/2023 and taking Anoro (umeclidinium / vilanterol) with reports that inhaler does not really help breathing. Reports since recent quit her breathing has been better. Spirometry evaluation reveals Severe restrictive lung disease. Post nebulized albuterol  tx revealed no significant pre-post change indicative of non-reversible obstructive lung disease. Spirometry [facility] based on CAT score and exacerbations in the last year. Patient with fair inhaler technique. Patient medication adherence poor, likely nonadherent to inhalers, reports using them as as needed at previous appointments, upon further questioning reported that she used inhalers yesterday.   -Continue Anoro (umeclidinium / vilanterol) 1 puff daily and encouraged adherence -Educated patient on purpose, proper use, potential adverse effects including risk of esophageal candidiasis and need to rinse mouth after each use.   -Reviewed results of pulmonary function tests.  Pt verbalized understanding of results and education.

## 2023-12-10 NOTE — Patient Instructions (Signed)
 It was nice to see you today! Congratulations on quitting smoking!!   Your lung test showed us  that you have severe restrictive lung disease with a lung age of 63 years old. Albuterol  nebulizer did not help your breathing.   Medication Changes:  Continue all other medication the same.

## 2023-12-10 NOTE — Progress Notes (Signed)
   S:     Chief Complaint  Patient presents with   Medication Management    Tobacco Cessation & PFTs   63 y.o. female who presents for diabetes evaluation, education, and management. Patient arrives in good spirits and presents without any assistance. She went to her daughters house to celebrate her 63th birthday. It has been 3 weeks since her last cigarette!! Patient reports breathing has been better - coughing less since quitting.   Patient was referred and last seen by Primary Care Provider, Dr. Diona, on 11/13/2023.  At last visit with pharmacy on 11/22/2023, planned a quit date of 11/25/2023 and has since quit smoking.   PMH is significant for COPD(2017), hypertension, HF, breast cancer. .   Medication adherence reported okay - adherent to all medications except her inhalers which she only takes as needed, but then reports daily use with further questioning Patient reports last dose of COPD medications was yesterday Current COPD medications: Anoro Ellipta  (umeclidinium / vilanterol) 62.5-25 mcg/act 1 puff daily  Rescue inhaler use frequency: occasionally 1-2 days weekly Patient exacerbation hx: years ago (2021) requiring prednisone    O: Review of Systems  Respiratory:  Positive for cough and wheezing.   Neurological:  Negative for dizziness.  All other systems reviewed and are negative.   Physical Exam Vitals reviewed.  Constitutional:      Appearance: Normal appearance.  Neurological:     Mental Status: She is alert.  Psychiatric:        Mood and Affect: Mood normal.        Behavior: Behavior normal.        Thought Content: Thought content normal.        Judgment: Judgment normal.    Vitals:   12/10/23 1007  BP: 99/61  Pulse: 66  SpO2: 97%   mMRC score= 0  CAT score= 6 See Documentation Flowsheet - CAT/COPD for complete symptom scoring.  See scanned report or Documentation Flowsheet (discrete results - PFTs) for Spirometry results. Patient provided good  effort while attempting spirometry.   Lung Age = 89 Albuterol  Neb  Lot# [id number]     Exp. 07/2024  Patient is participating in a Managed Medicaid Plan:  Yes  A/P: Patient has been experiencing shortness of breath and coughing with COPD status for years with chronic tobacco use, with recent quit as of 11/25/2023 and taking Anoro (umeclidinium / vilanterol) with reports that inhaler does not really help breathing. Reports since recent quit her breathing has been better. Spirometry evaluation reveals Severe restrictive lung disease. Post nebulized albuterol  tx revealed no significant pre-post change indicative of non-reversible obstructive lung disease. Spirometry [facility] based on CAT score and lack of exacerbations in the last year. Patient with fair inhaler technique. Patient medication adherence poor, likely nonadherent to inhalers, reports using them as as needed at previous appointments, upon further questioning reported that she used inhalers yesterday.   -Continue Anoro (umeclidinium / vilanterol) 1 inhalation daily and encouraged adherence -Educated patient on purpose, proper use, potential adverse effects including risk of esophageal candidiasis and need to rinse mouth after each use.   -Reviewed results of pulmonary function tests.  Pt verbalized understanding of results and education.    Written patient instructions provided.   Total time in face to face counseling 57 minutes.    Follow-up:  Pharmacist TBD PCP clinic visit 01/21/2024 Patient seen with Estefana Blase, PharmD Candidate - PY4 student, Dr. Manon, DO

## 2023-12-12 NOTE — Progress Notes (Signed)
 Reviewed and agree with Dr Rennis plan.

## 2024-01-01 ENCOUNTER — Telehealth: Payer: Self-pay | Admitting: Pharmacist

## 2024-01-01 NOTE — Telephone Encounter (Signed)
 Attempted to contact patient for follow-up of tobacco intake reduction / cessation.  Believed to have quit in early September 2025.   Left HIPAA compliant voice mail requesting call back to direct phone: [phone]  Total time with patient call and documentation of interaction: 4 minutes.  Additional follow-up phone call planned: 1 week

## 2024-01-06 ENCOUNTER — Telehealth: Payer: Self-pay | Admitting: Pharmacist

## 2024-01-06 NOTE — Telephone Encounter (Signed)
 Attempted to contact patient for follow-up of smoking cessation   Left HIPAA compliant voice mail requesting call back to direct phone: [phone]  Total time with patient call and documentation of interaction: 3 minutes.

## 2024-01-21 ENCOUNTER — Ambulatory Visit (INDEPENDENT_AMBULATORY_CARE_PROVIDER_SITE_OTHER): Admitting: Family Medicine

## 2024-01-21 ENCOUNTER — Encounter: Payer: Self-pay | Admitting: Family Medicine

## 2024-01-21 VITALS — BP 104/72 | HR 72 | Ht 66.0 in | Wt 147.2 lb

## 2024-01-21 DIAGNOSIS — Z0000 Encounter for general adult medical examination without abnormal findings: Secondary | ICD-10-CM

## 2024-01-21 DIAGNOSIS — Z716 Tobacco abuse counseling: Secondary | ICD-10-CM

## 2024-01-21 LAB — POCT GLYCOSYLATED HEMOGLOBIN (HGB A1C): Hemoglobin A1C: 5.6 % (ref 4.0–5.6)

## 2024-01-21 NOTE — Progress Notes (Signed)
    SUBJECTIVE:   CHIEF COMPLAINT / HPI:   Smoking cessation / COPD Patient presenting today to follow-up on smoking cessation.  Reports this is still going well.  Reports quitting without the use of additional resources.  Has been several weeks since she has last smoked.  Reports her breathing is improved.  Uses Anoro Ellipta  for COPD.  PERTINENT  PMH / PSH: COPD  OBJECTIVE:   BP 104/72   Pulse 72   Ht 5' 6 (1.676 m)   Wt 147 lb 3.2 oz (66.8 kg)   SpO2 97%   BMI 23.76 kg/m   General: Well-appearing. Resting comfortably in room. CV: Normal S1/S2. No extra heart sounds. Warm and well-perfused. Pulm: Breathing comfortably on room air. CTAB.  No wheezing.  No increased WOB. Abd: Soft, non-tender, non-distended. Skin:  Warm, dry. Ext: No extremity swelling  Psych: Pleasant and appropriate.    ASSESSMENT/PLAN:   Assessment & Plan Encounter for smoking cessation counseling Continues to progress well in quitting process.  Offered therapy resources which patient declines at this time.  Patient aware of additional support from our clinic team as needed during quitting process.  Congratulated and encouraged patient in this time. Healthcare maintenance -Discussed and provided contact information for scheduling routine colonoscopy -Discussed and ordered routine bone density scan -Discussed shingles vaccine which patient can receive at her local pharmacy -Lipid panel, A1c today   RTC as needed.  Damien Cassis, MD [facility]

## 2024-01-21 NOTE — Patient Instructions (Addendum)
 Thank you for visiting clinic today and allowing us  to participate in your care!  We are checking in on some lab work today and will keep you updated.   To schedule your bone density scan, please call [phone].  To schedule your colonoscopy, please call [phone].   Keep up the great work with quitting smoking!   Please schedule an appointment as needed.    Reach out any time with any questions or concerns you may have - we are here for you!  Damien Cassis, MD [facility] [phone]

## 2024-01-22 LAB — LIPID PANEL
Chol/HDL Ratio: 2.5 ratio (ref 0.0–4.4)
Cholesterol, Total: 232 mg/dL — ABNORMAL HIGH (ref 100–199)
HDL: 94 mg/dL (ref >39)
LDL Chol Calc (NIH): 126 mg/dL — ABNORMAL HIGH (ref 0–99)
Triglycerides: 69 mg/dL (ref 0–149)
VLDL Cholesterol Cal: 12 mg/dL (ref 5–40)

## 2024-01-24 ENCOUNTER — Ambulatory Visit: Payer: Self-pay | Admitting: Family Medicine

## 2024-01-24 ENCOUNTER — Other Ambulatory Visit: Payer: Self-pay | Admitting: Pulmonary Disease

## 2024-01-24 MED ORDER — ATORVASTATIN CALCIUM 10 MG PO TABS: 10.0000 mg | ORAL_TABLET | Freq: Every day | ORAL | 3 refills | Status: AC

## 2024-01-24 NOTE — Telephone Encounter (Signed)
 Patient needs appointment for farther refills

## 2024-02-07 ENCOUNTER — Telehealth: Payer: Self-pay | Admitting: Pharmacist

## 2024-02-07 ENCOUNTER — Encounter: Payer: Self-pay | Admitting: Pharmacist

## 2024-02-07 NOTE — Telephone Encounter (Signed)
 Reviewed and agree with Dr Rennis plan.

## 2024-02-07 NOTE — Telephone Encounter (Signed)
 Patient contacted for follow/up of tobacco cessation attempt.   Since last contact patient reports she remains abstinent from smoking.  Denies smoking since early September - now ~ 2.5 months quit and is NOT taking any medication for cessation support.   Rates CONFIDENCE in remaining quit from tobacco as high   Total time with patient call and documentation of interaction: 8 minutes. Follow-up phone call planned: 3-6 months.

## 2024-02-07 NOTE — Telephone Encounter (Signed)
-----   Message from Maude Lagos sent at 01/23/2024  3:46 PM EST ----- Regarding: Quit for a few weeks as of 11/4  - pcp visit with Diona Follow-up for QUIT!!! ----- Message ----- From: [place] Maude MATSU, RPH-CPP Sent: 01/21/2024  12:00 AM EST To: Maude MATSU Lagos, RPH-CPP Subject: FW: tobacco quit date 9/8                       ----- Message ----- From: [place] Maude MATSU, RPH-CPP Sent: 01/01/2024  12:00 AM EDT To: Maude MATSU Lagos, RPH-CPP Subject: FW: tobacco quit date 9/8                      Quit 11/25/2023  Doing well at Rx visit end of September with Spirometry.   Follow-up to tobacco cessation. ----- Message ----- From: Annelyse Rey G, RPH-CPP Sent: 12/16/2023  12:00 AM EDT To: Maude MATSU Lag[place], RPH-CPP Subject: FW: tobacco quit date 9/8                      QUIT as of 11/26/2023 ----- Message ----- From: Alize Acy G, RPH-CPP Sent: 11/26/2023  12:00 AM EDT To: Maude MATSU [place], RPH-CPP Subject: tobacco quit date 9/8

## 2024-02-17 ENCOUNTER — Encounter: Payer: Self-pay | Admitting: Internal Medicine
